# Patient Record
Sex: Female | Born: 1950 | Race: White | Hispanic: No | Marital: Married
Health system: Midwestern US, Academic
[De-identification: ages and names within clinical notes are randomized; demographics above are authoritative.]

## PROBLEM LIST (undated history)

## (undated) DIAGNOSIS — M5412 Radiculopathy, cervical region: ICD-10-CM

---

## 2014-10-03 IMAGING — CR CHEST
2 series · 2 of 2 positions shown · non-contrast
Comparison: November 12, 2013

CHEST 2 VIEWS
INDICATION: Pleural fibrosis
TECHNIQUE: PA and lateral projections.

[chest pa]
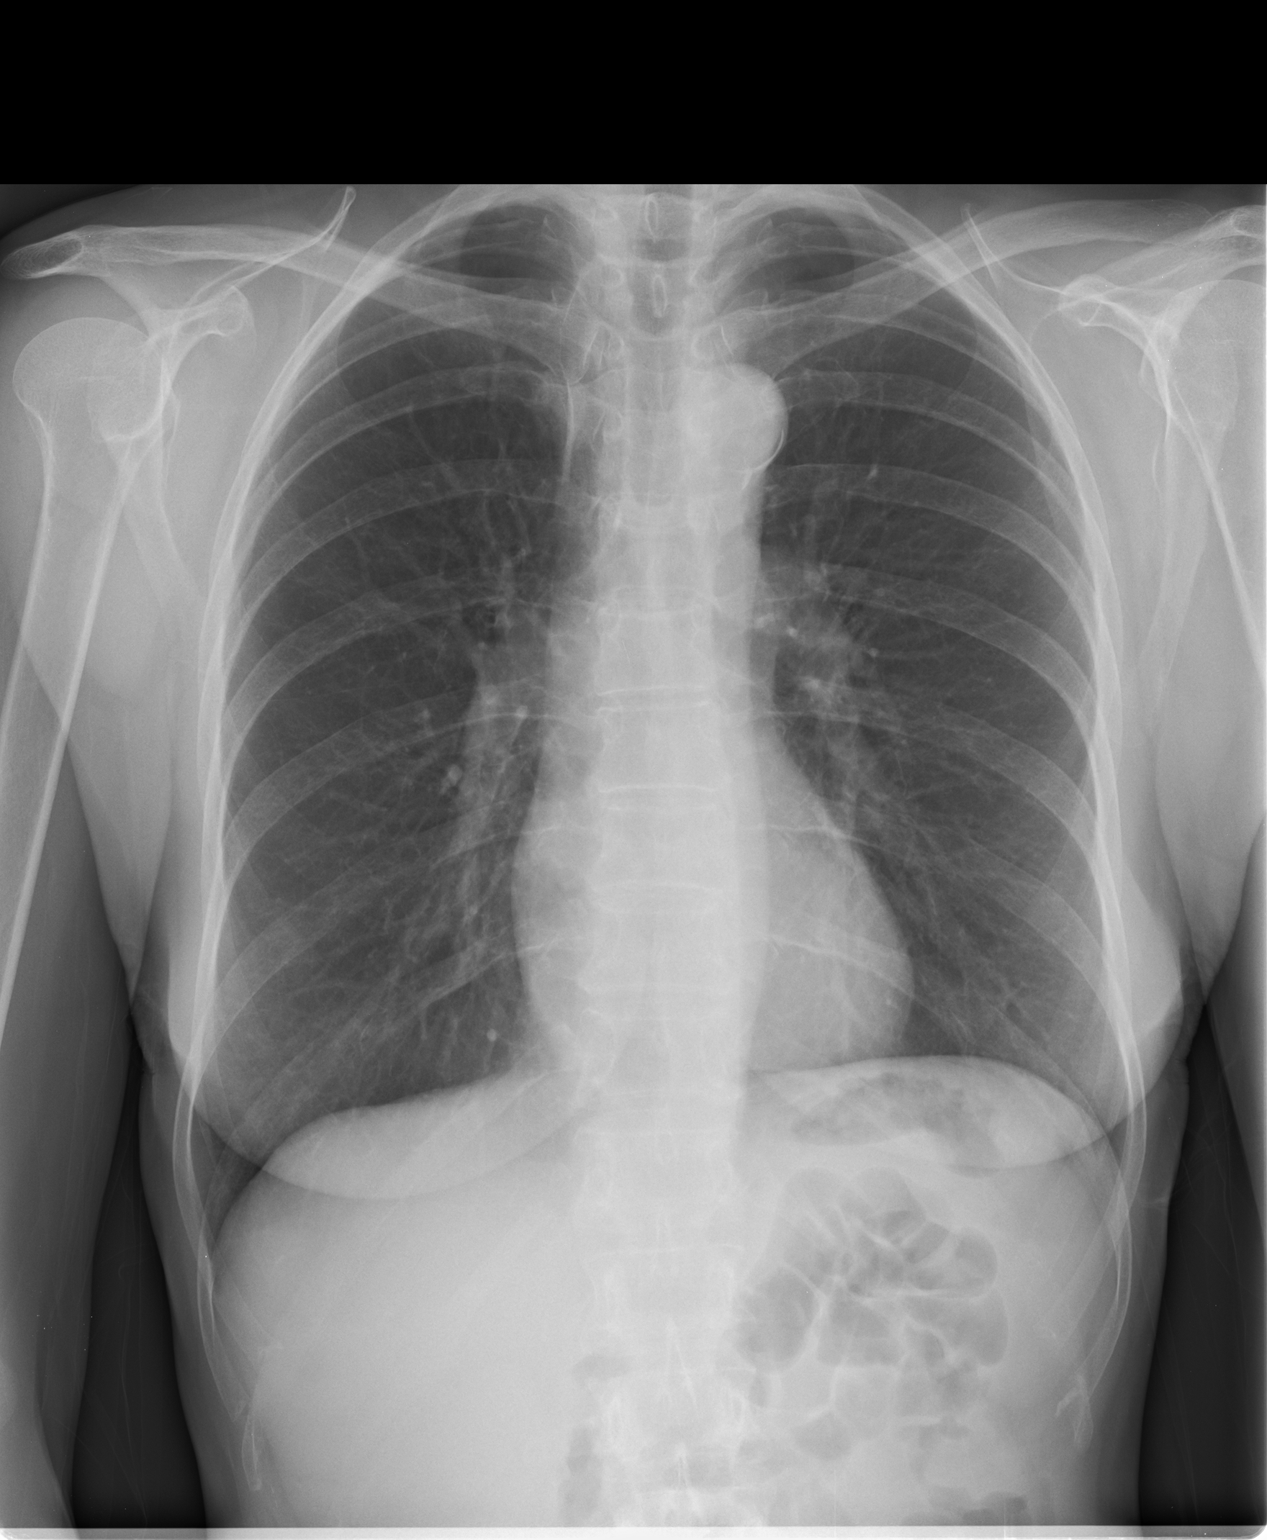

[chest lat]
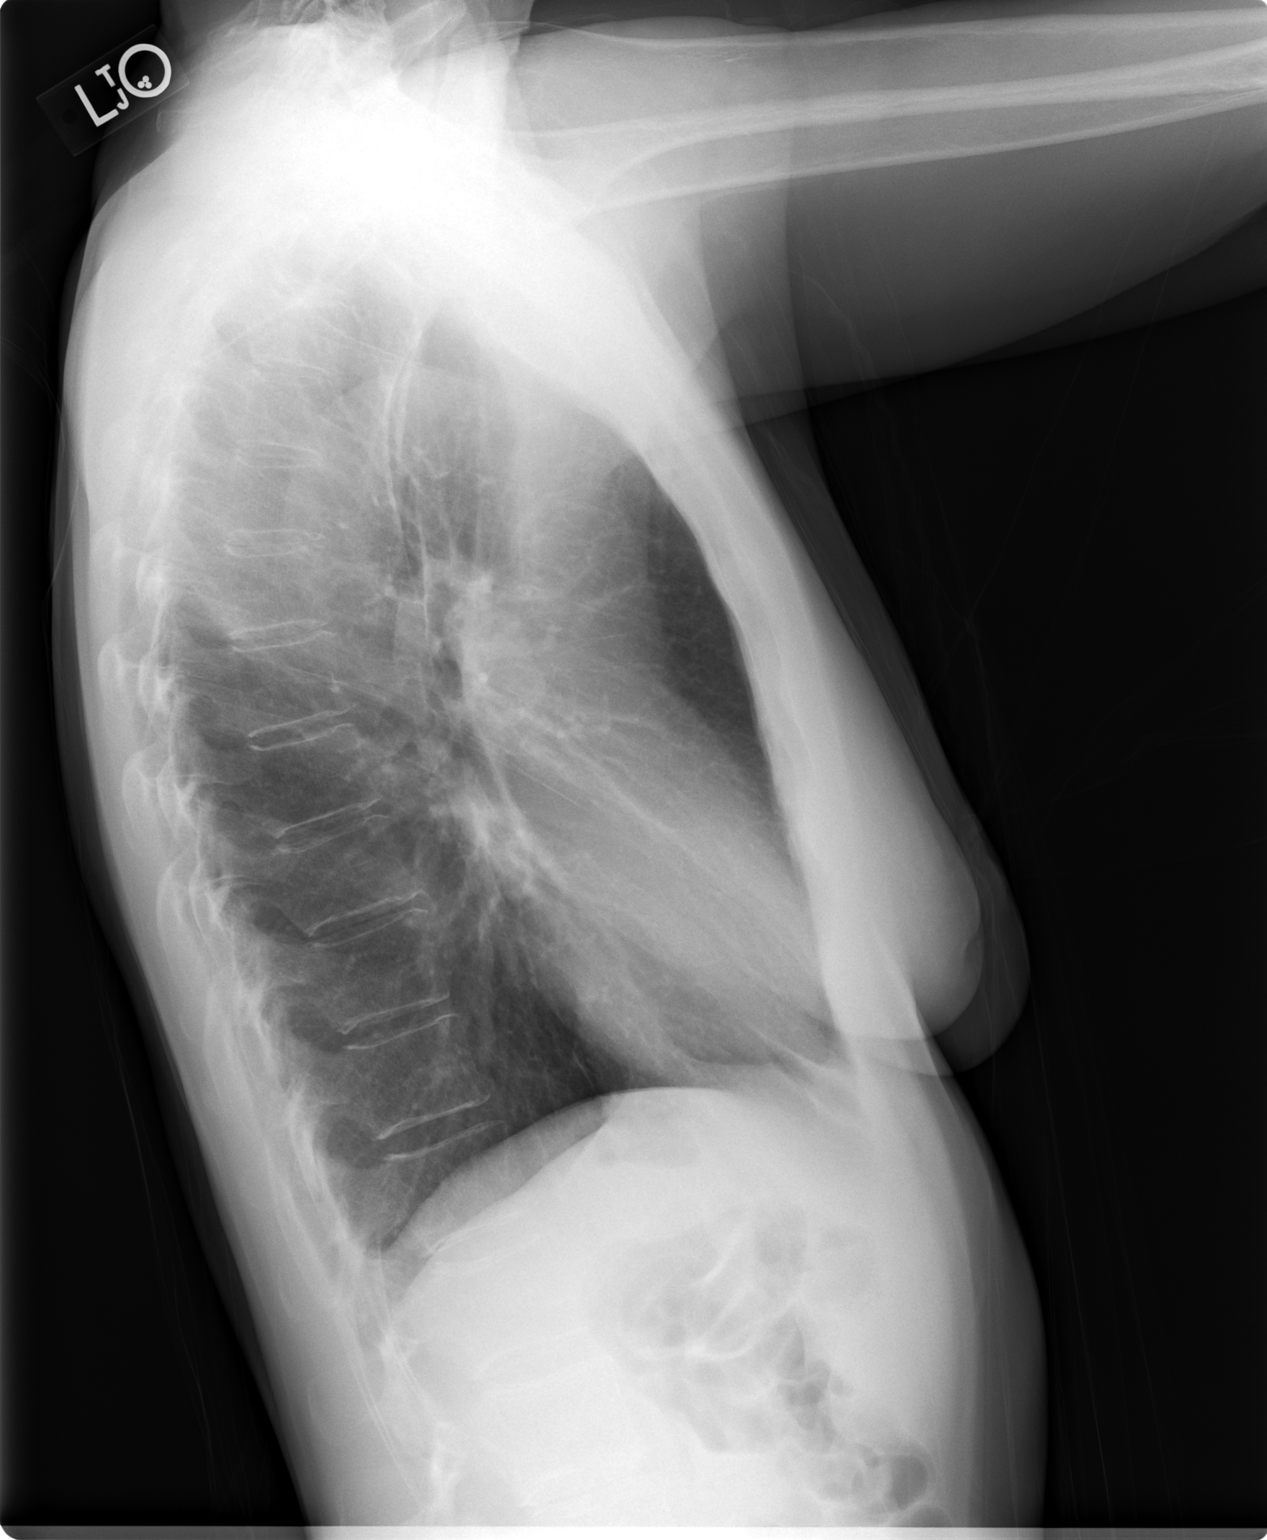

[2 of 2 positions shown; findings below may reference images not displayed]

IMPRESSION: No active process
FINDINGS: There is a normal inspiration.
There are a few scattered central calcified granulomas
The lung fields reveal no active process..
There is mild atheromatous disease without ectasia of the aortic knob
The heart and mediastinum are physiologic in size and contour

Tech Notes: PLEURAL FIBROSIS. TJ

## 2014-10-03 IMAGING — CT Abdomen^1_ABDOMEN_WITHOUT_WITH (Adult)
1 series · 15 of 32 positions shown, 19 images · non-contrast
Comparison: Chest two view
COMPARISON: Chest two view

------------- REPORT [id number] -------------
CT ABDOMEN WITHOUT CONTRAST
INDICATION: Pleural fibrosis
TECHNIQUE: Standard axial reference planes without contrast
Orthogonal reconstructions
Oral contrast administered

[Series 2: abd w/o 5.0 soft tissue · axial · non-contrast · 0.62mm/px · z∈[-357,-107]mm · 15 of 56 slices shown, 19 images]
[im 4/56  soft-tissue]
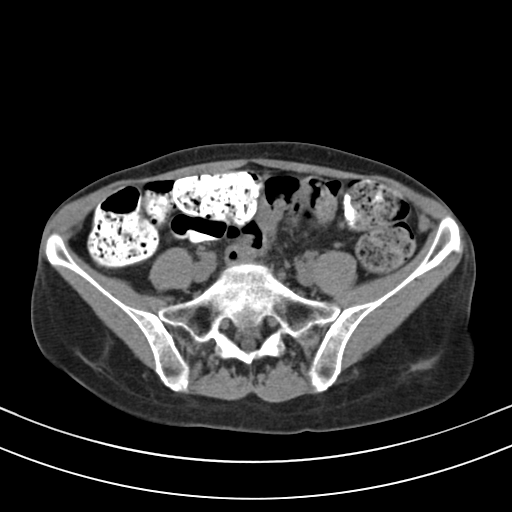
[im 4/56  bone]
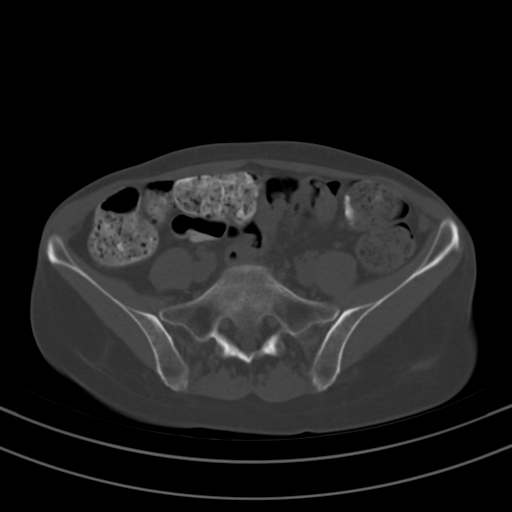
[im 8/56  soft-tissue]
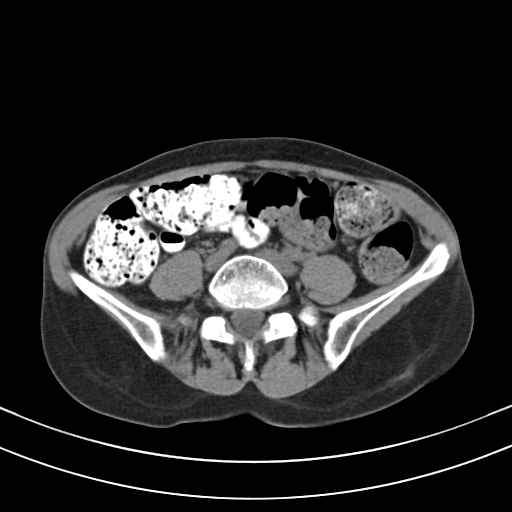
[im 11/56  soft-tissue]
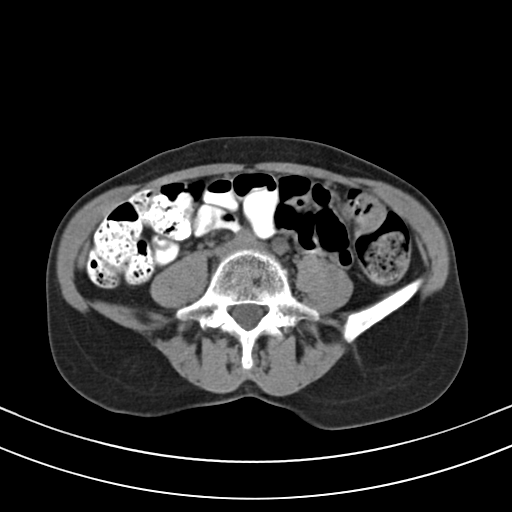
[im 16/56  soft-tissue]
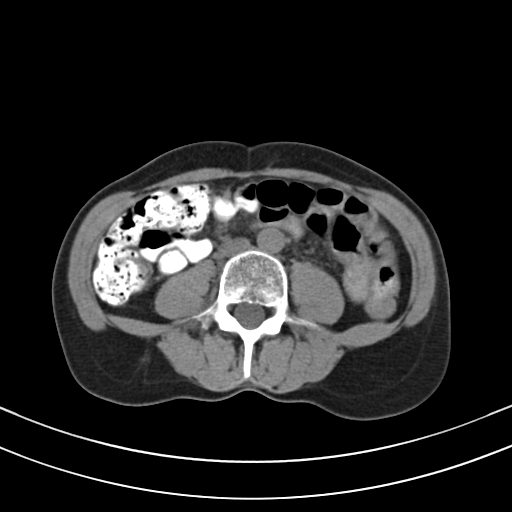
[im 20/56  soft-tissue]
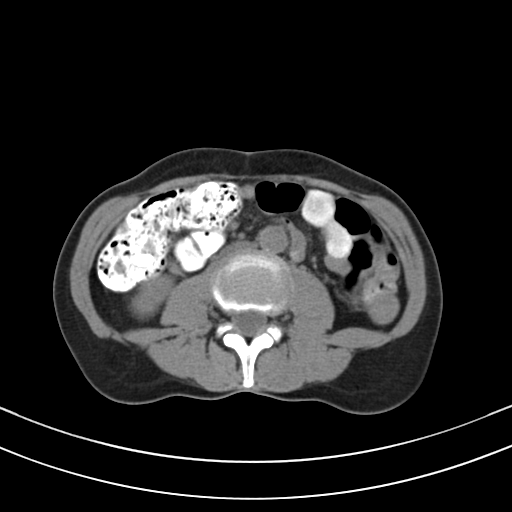
[im 24/56  soft-tissue]
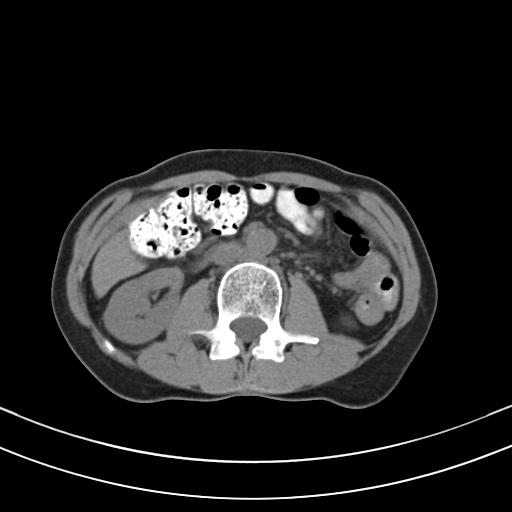
[im 29/56  soft-tissue]
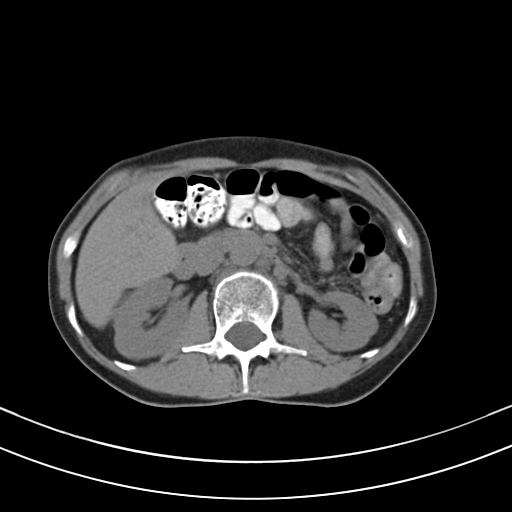
[im 32/56  soft-tissue]
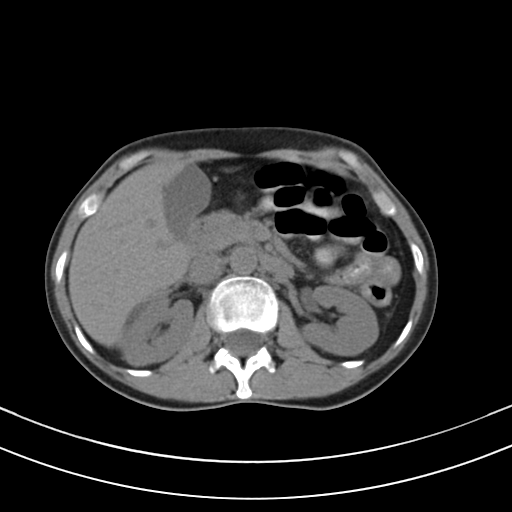
[im 36/56  soft-tissue]
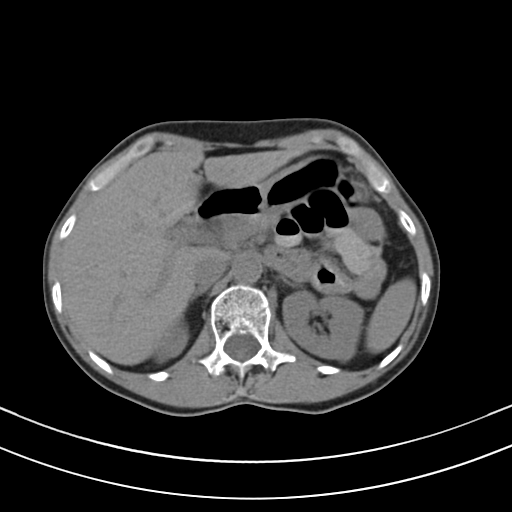
[im 36/56  bone]
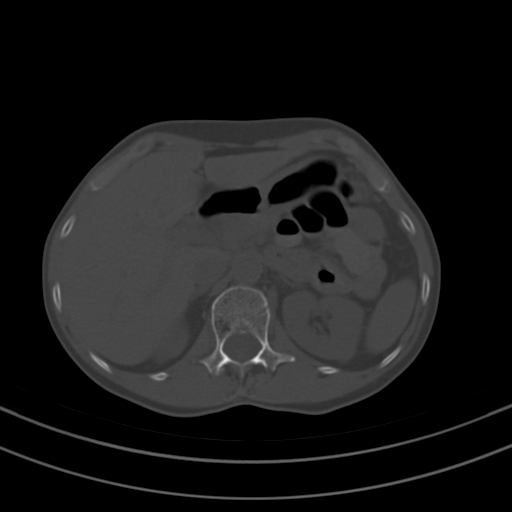
[im 40/56  soft-tissue]
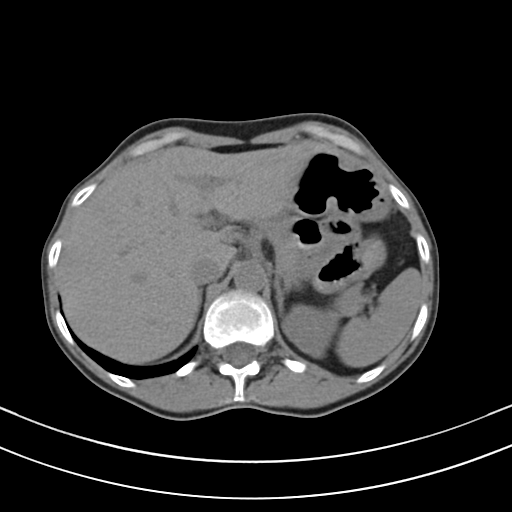
[im 45/56  soft-tissue]
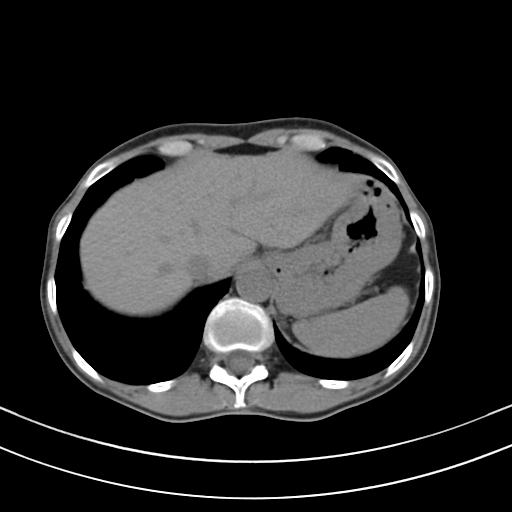
[im 48/56  soft-tissue]
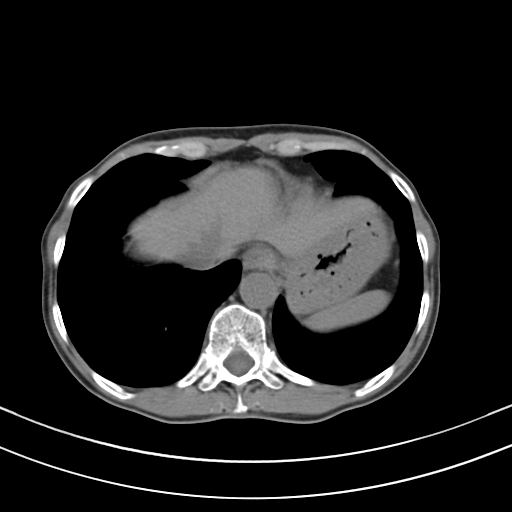
[im 48/56  lung]
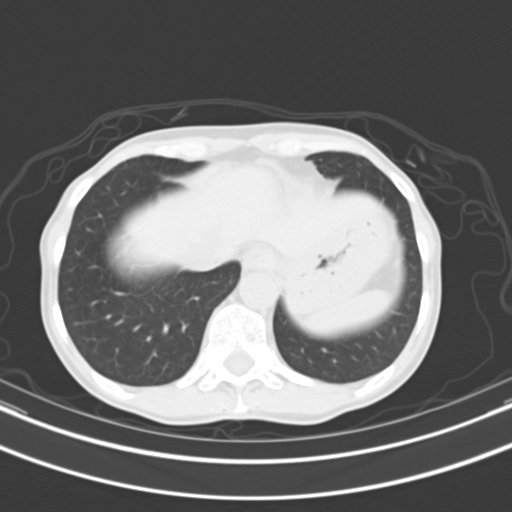
[im 50/56  lung]
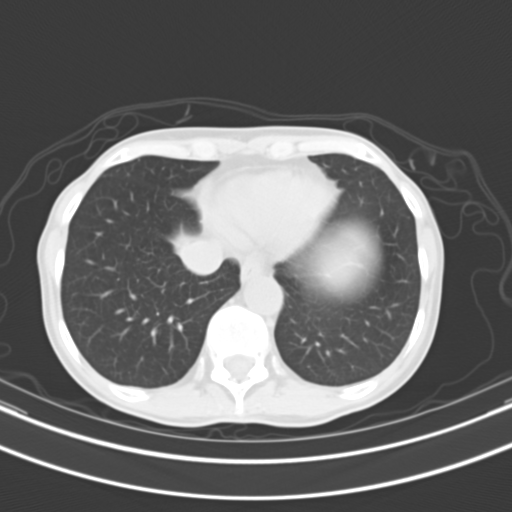
[im 52/56  soft-tissue]
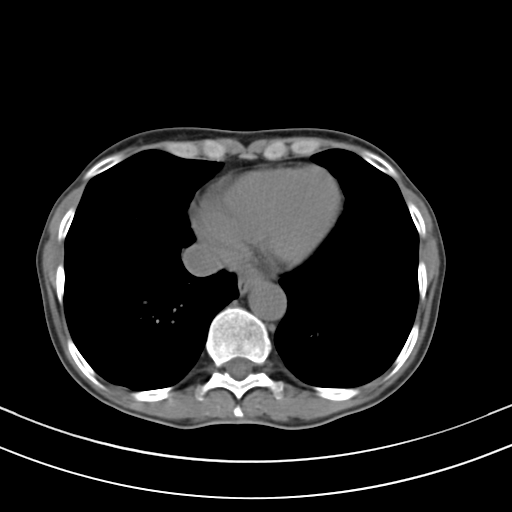
[im 52/56  lung]
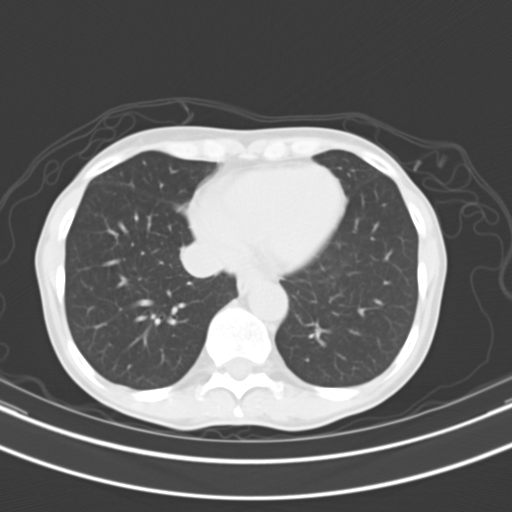
[im 54/56  lung]
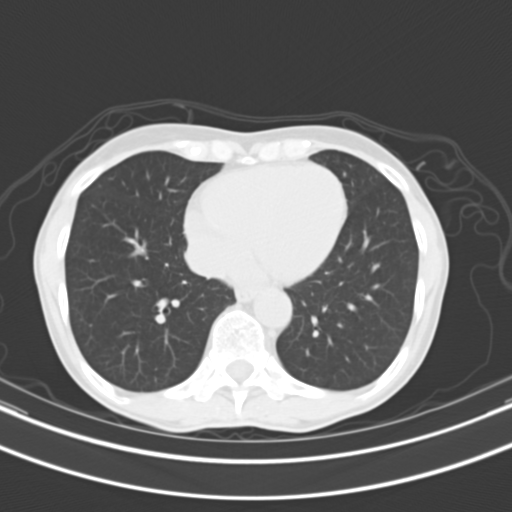

[15 of 32 positions shown; findings below may reference images not displayed]

IMPRESSION: Moderate fecal material in the visualized colon consistent with constipation.
No noncontrast solid organ abnormality.
No inflammatory changes of bowel
FINDINGS: The lung bases reveal no active process .
The gall bladder is unremarkable
The  upper abdominal solid organs reveal no non contrast abnormality.
The aorta is physiologic in size.
There is no suspicious adenopathy
There is no focal mesenteric edema
There is no  free air
There is no  free fluid
No dilated bowel or inflammatory changes of bowel are seen.
Oral contrast can be identified to the splenic flexure
There is moderate fecal material in the visualized colon

Tech Notes: PLEURAL FIBROSIS. TJ

------------- REPORT [id number] -------------
ADDENDUM

The images have been reviewed.
There is no evidence of fibrosis of the demonstrated lung fields or fibrosis of the demonstrated
retroperitoneum of the abdomen.

Job:  [id number]

Tech Notes: PLEURAL FIBROSIS. TJ

CT ABDOMEN WITHOUT CONTRAST
IMPRESSION: Moderate fecal material in the visualized colon consistent with constipation.
No noncontrast solid organ abnormality.
No inflammatory changes of bowel
FINDINGS: The lung bases reveal no active process .
The gall bladder is unremarkable
The  upper abdominal solid organs reveal no non contrast abnormality.
The aorta is physiologic in size.
There is no suspicious adenopathy
There is no focal mesenteric edema
There is no  free air
There is no  free fluid
No dilated bowel or inflammatory changes of bowel are seen.
Oral contrast can be identified to the splenic flexure
There is moderate fecal material in the visualized colon

Tech Notes: PLEURAL FIBROSIS. TJ

## 2015-04-03 IMAGING — CT Abdomen^1_CT_UROGRAM (Adult)
3 of 6 series · 12 of 39 positions shown, 18 images · IV contrast (APPLIED)
Comparison: none

[Series 2: abd/pelvis w/o 5.0 soft tissue · axial · non-contrast · 0.59mm/px · z∈[-429,-89]mm · 10 of 46 slices shown, 16 images]
[im 4/46  soft-tissue]
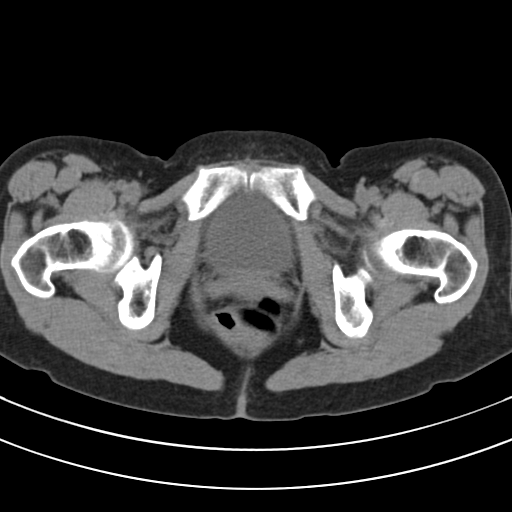
[im 4/46  bone]
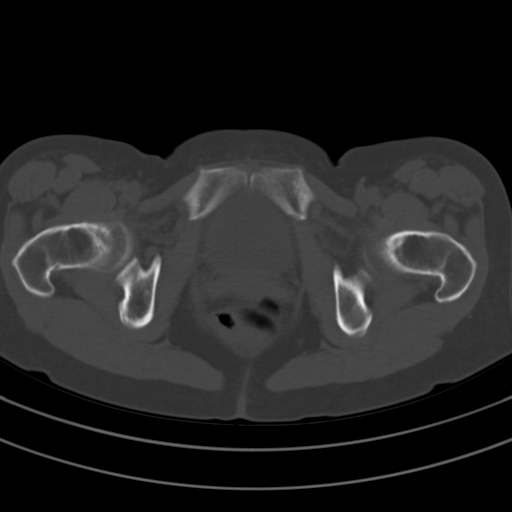
[im 8/46  soft-tissue]
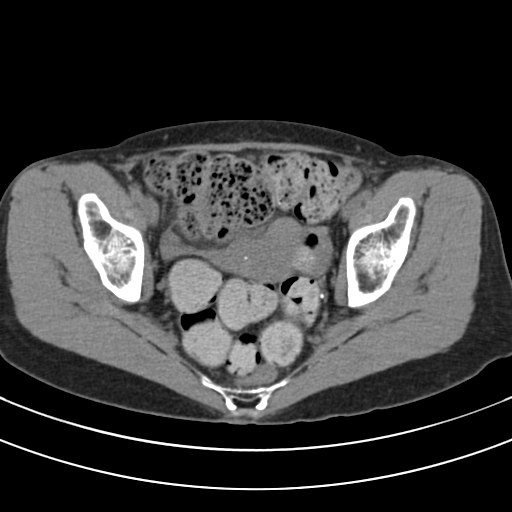
[im 12/46  soft-tissue]
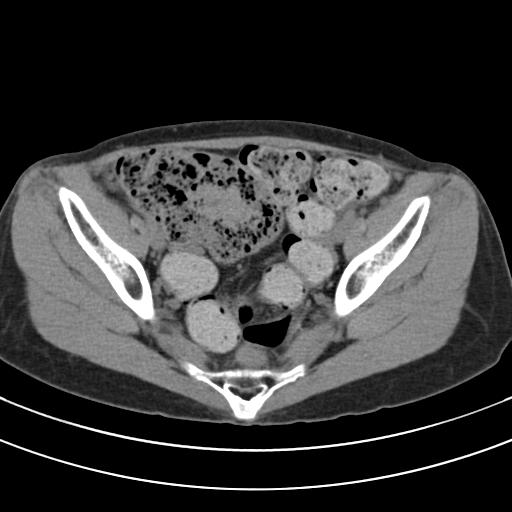
[im 16/46  soft-tissue]
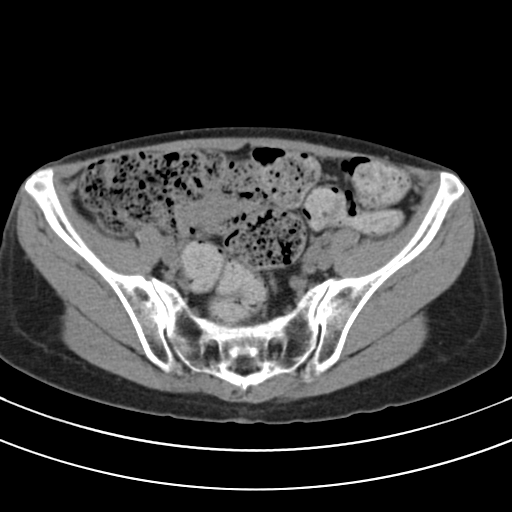
[im 19/46  soft-tissue]
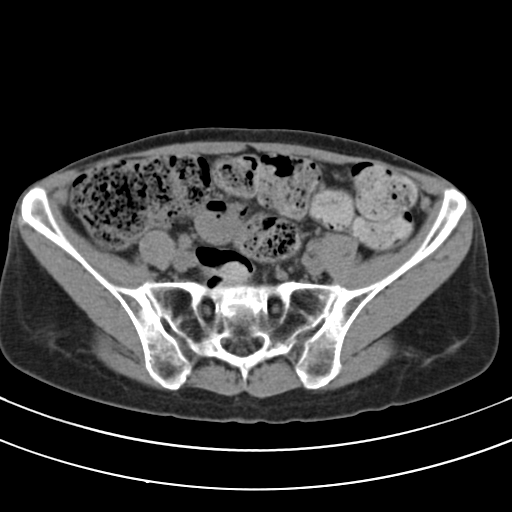
[im 27/46  soft-tissue]
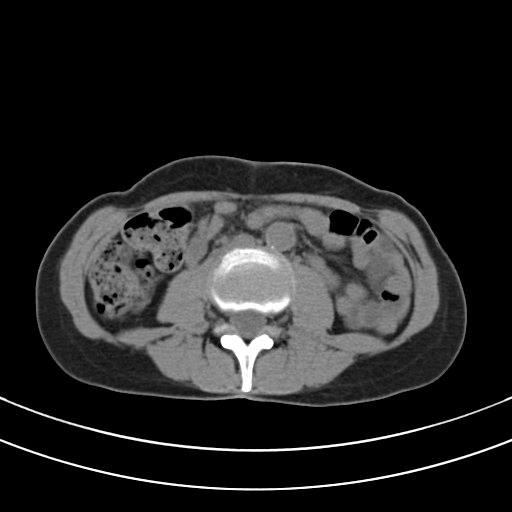
[im 31/46  soft-tissue]
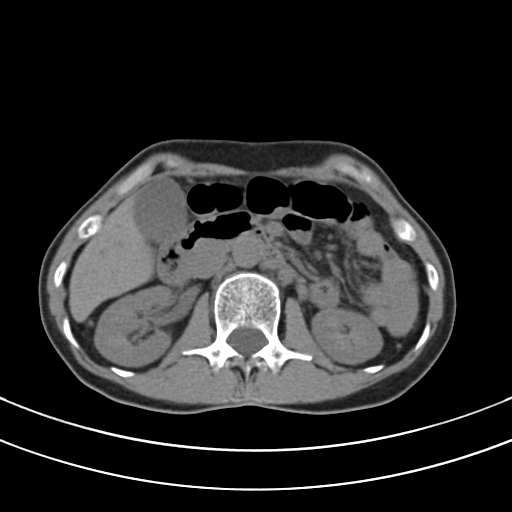
[im 31/46  lung]
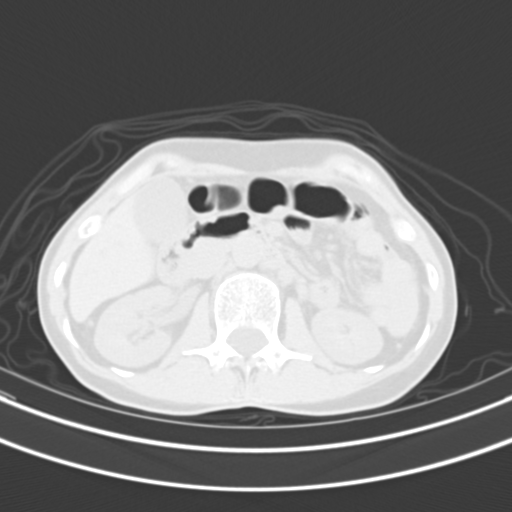
[im 34/46  soft-tissue]
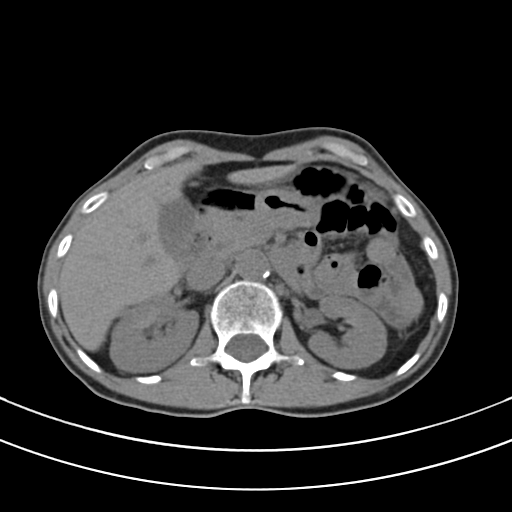
[im 34/46  lung]
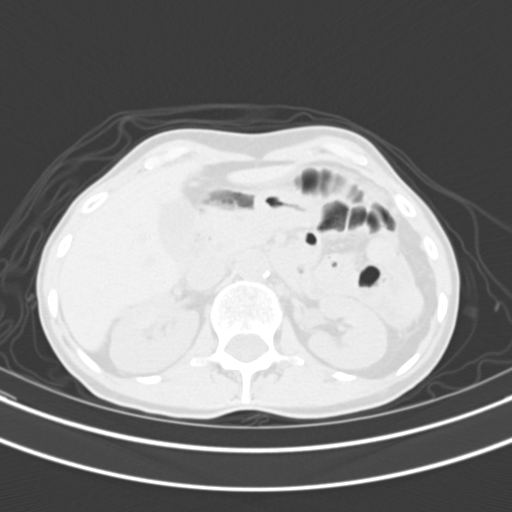
[im 38/46  soft-tissue]
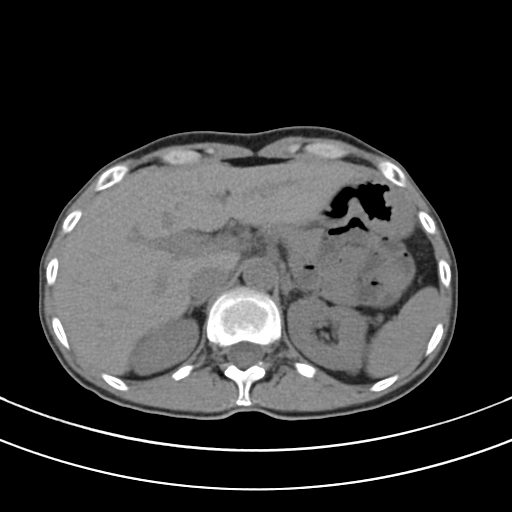
[im 38/46  lung]
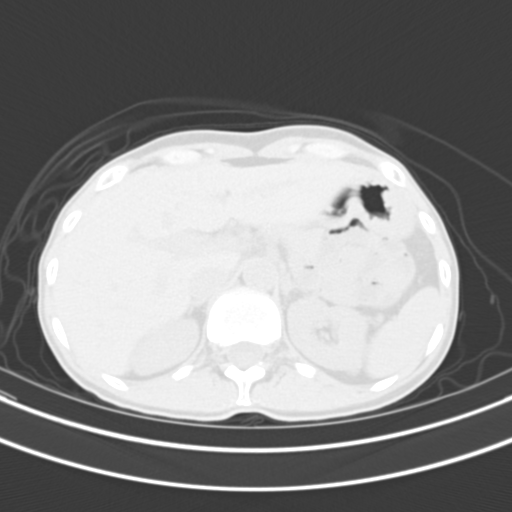
[im 38/46  bone]
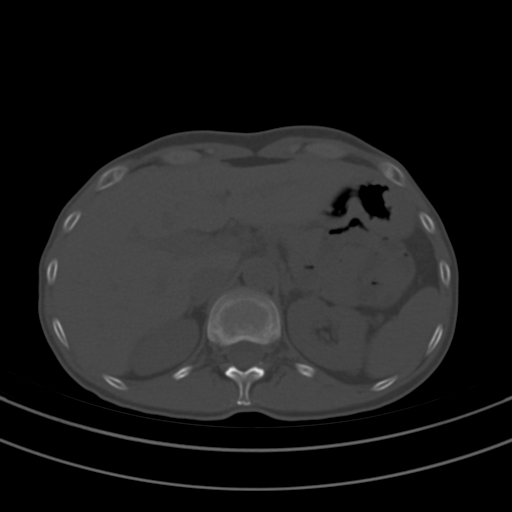
[im 42/46  soft-tissue]
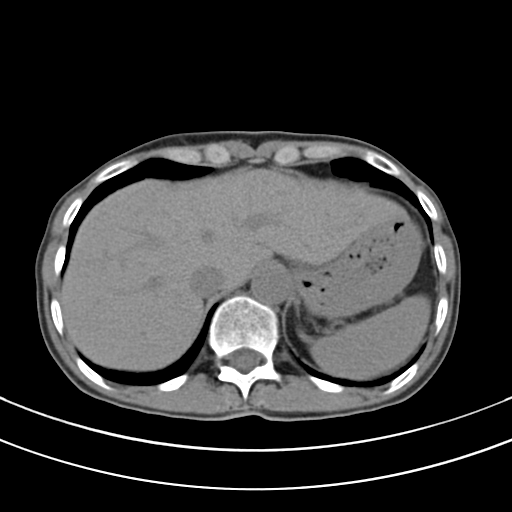
[im 42/46  lung]
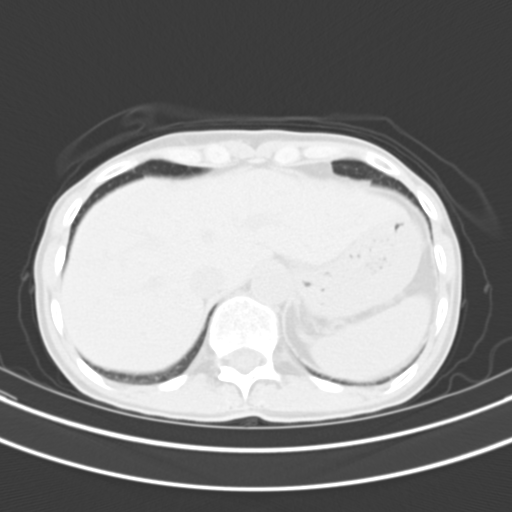

[Series 10: abd/pelvis with 5.0 sagittal · sagittal · 0.85mm/px · 1 of 22 slices shown]
[im 8/22  soft-tissue]
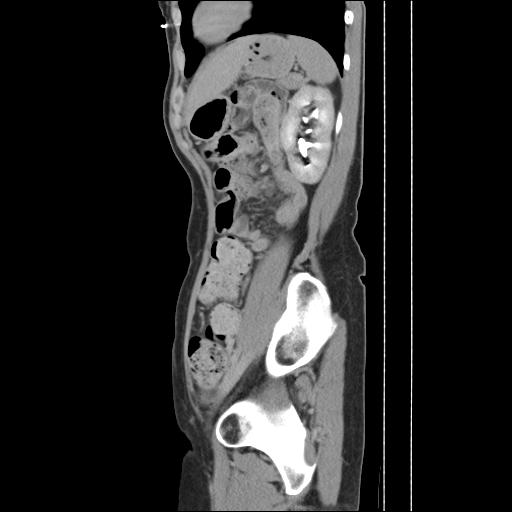

[Series 11: abd/pelvis with 5.0 coronal · coronal · 0.85mm/px · 1 of 3 slices shown]
[im 2/3  soft-tissue]
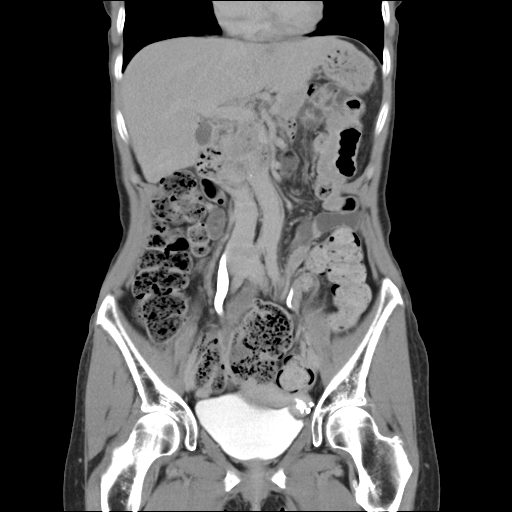

[12 of 39 positions shown; findings below may reference images not displayed]

DIAGNOSTIC STUDIES

EXAM
CT ABDOMEN AND PELVIS WITH and without CONTRAST

INDICATION
CT UROGRAM PER ORDER, R/O FIBROID VS MASS
PT C/O LOWER LEFT PELVIC PRESSURE. HX OF FIBROID. GFR 75. 900FO ZIOXAYY.
TJ/RG

TECHNIQUE
Contiguous transaxial imaging through the abdomen and pelvis were obtained before and during the
uneventful administration of 100 milliliter Optiray 350 low osmolar intravenous iodinated contrast
material. Coronal and sagittal reformations were obtained from the transaxial source data.

All CT scans at this facility use dose modulation, iterative reconstruction, and/or weight based
dosing when appropriate to reduce radiation dose to as low as reasonably achievable.

COMPARISONS
No comparison is available

FINDINGS
Lung bases are clear. The liver and gallbladder are normal. The spleen is unremarkable. The
adrenal glands are within normal limits. Kidneys are are essentially unremarkable. A few small
hypodensities can be identified which are too small to further characterize but likely represent
small cysts.
The pancreas is normal. No retroperitoneal adenopathy is seen. There is minimal calcification of
the aorta. Circum aortic left renal vein is evident.
Small large bowel are normal in caliber. No mesenteric adenopathy or ascites is seen. A moderate
to large amount of fecal material is noted throughout the colon. No signs symptoms of constipation
should be excluded clinically.
Densely calcified mass can be identified in the region of the left adnexa or uterus measuring
centimeters most likely indicating a pedunculated uterine fibroid. Second 2.3 centimeter fibroid
can be identified anteriorly image 69 series 5. No pelvic adenopathy is seen. Air is noted within
the urinary bladder presumably representing recent catheterization. Appropriate correlation is
recommended of. Osseous structures are within normal limits with no concerning osseous lesions
seen.

IMPRESSION
Densely calcified 2.8 centimeter lesion likely representing pedunculated fibroid adjacent to the
left uterus. Second 2.3 centimeter fibroid can be identified anteriorly.
2. Moderate to large amount of fecal material throughout the colon. Clinical evaluation to exclude
constipation is recommended.
3. Small amount of air within the urinary bladder. Clinical evaluation to confirm recent
catheterization is recommended. In the absence of catheterization, fistula or infection could be
considered and urinalysis may be beneficial.

## 2015-10-29 IMAGING — US RETLM
1 series · 14 of 16 positions shown · non-contrast
Comparison: none

[Series 1: us retroperitoneal limited · 14 of 46 slices shown]
[im 1/46]
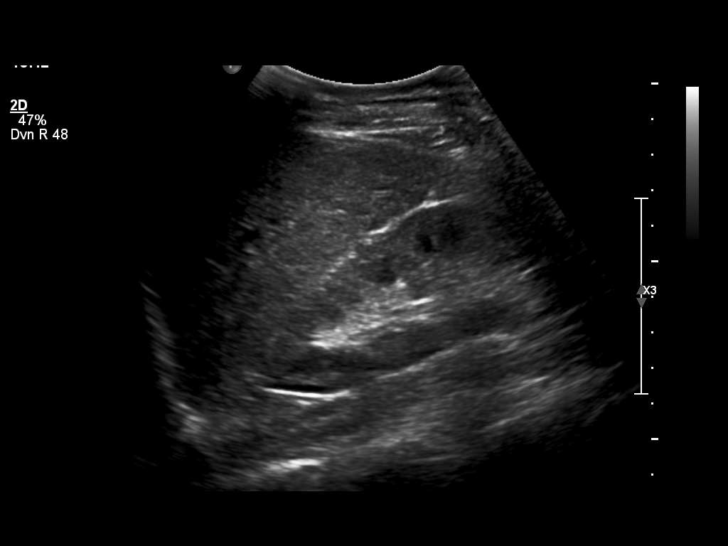
[im 4/46]
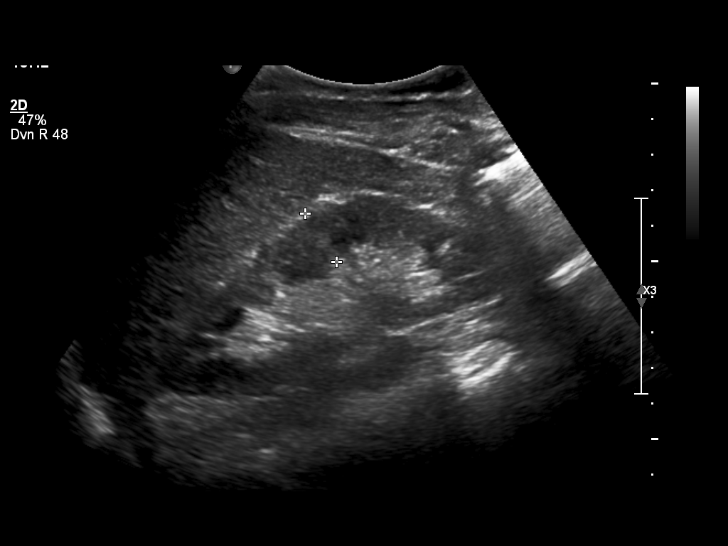
[im 7/46]
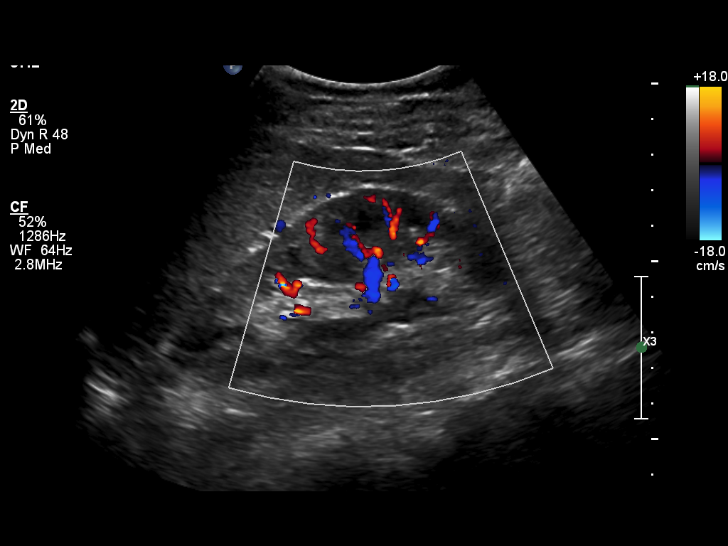
[im 13/46]
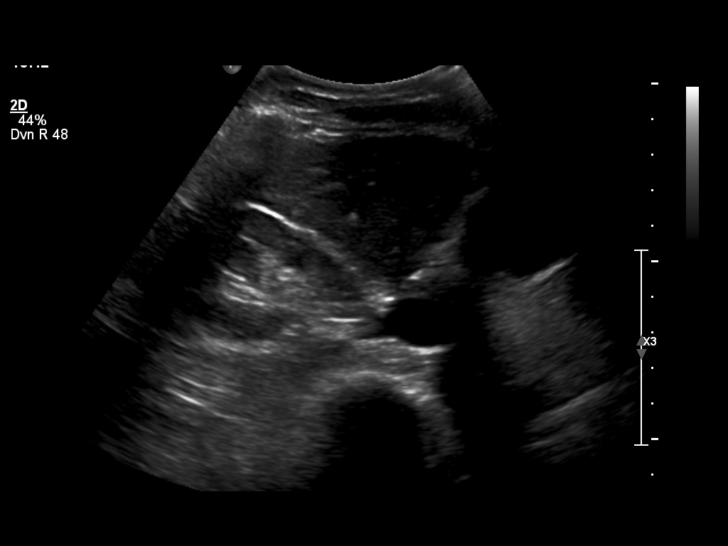
[im 16/46]
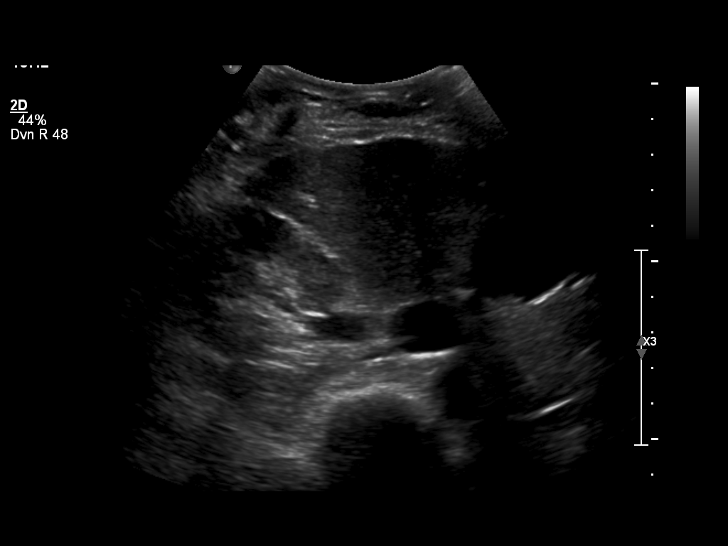
[im 19/46]
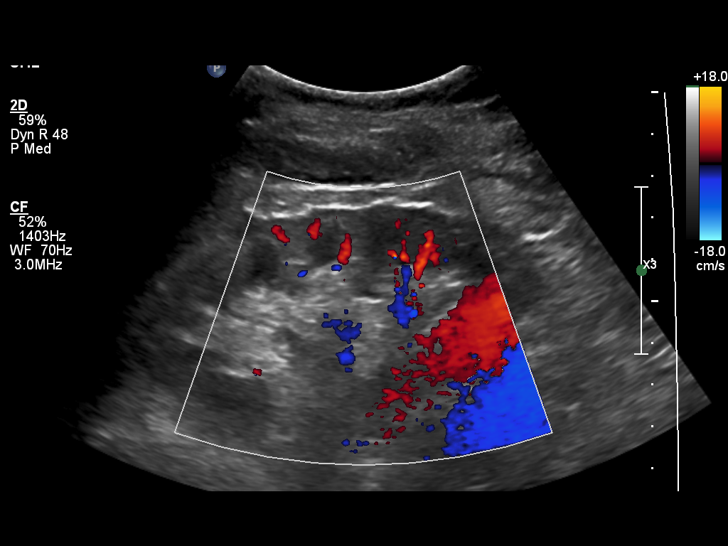
[im 22/46]
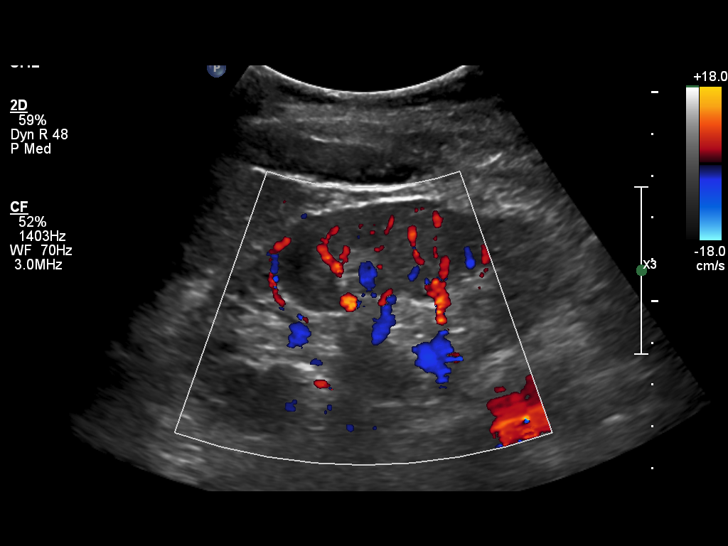
[im 25/46]
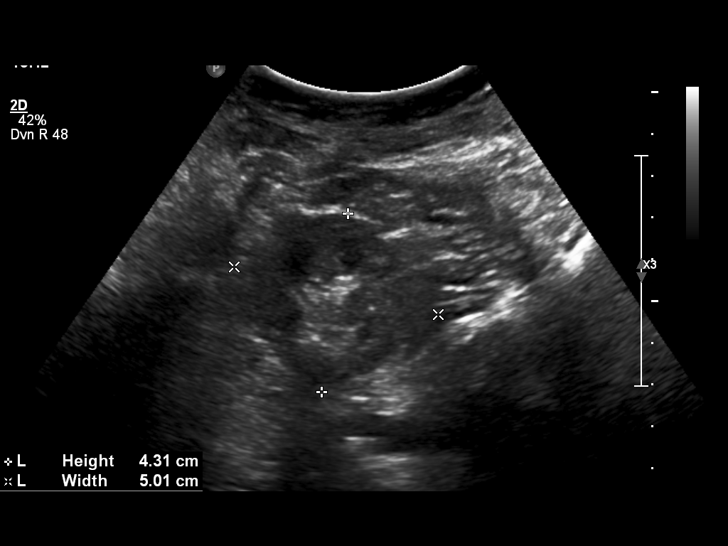
[im 28/46]
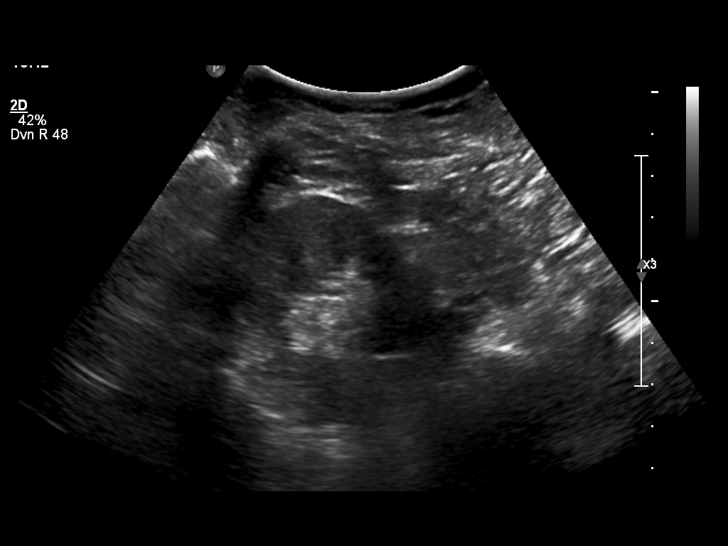
[im 31/46]
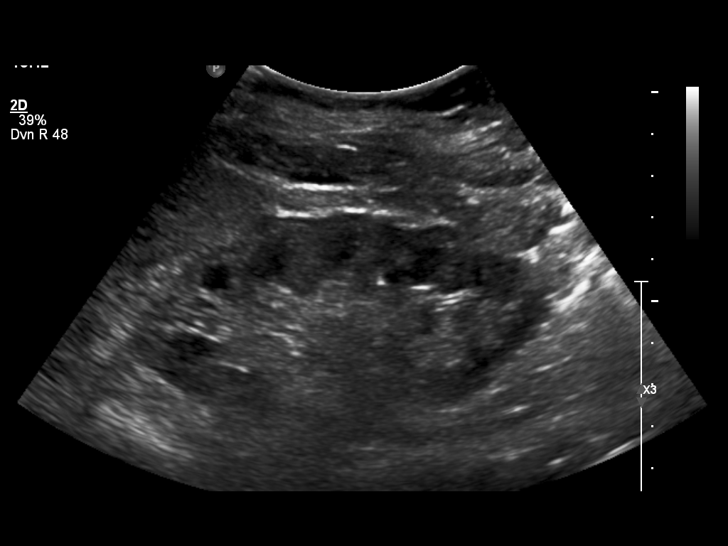
[im 37/46]
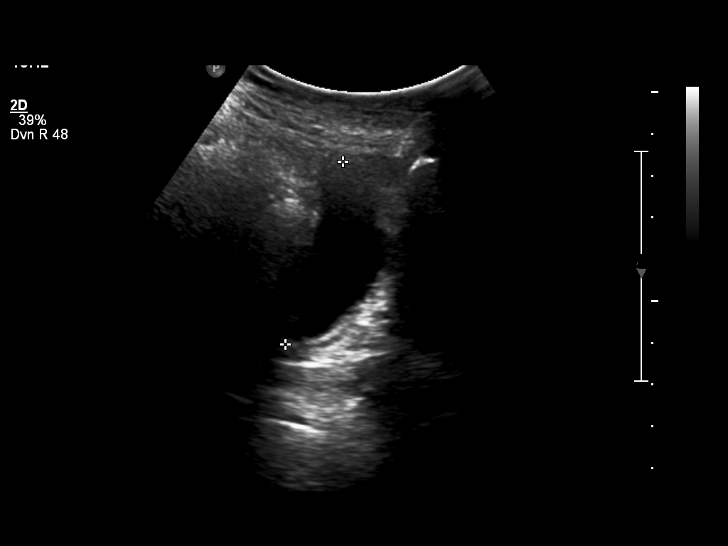
[im 40/46]
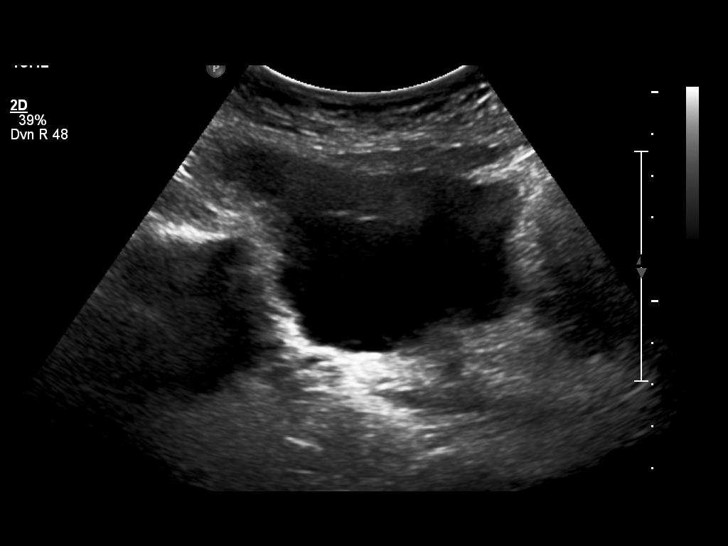
[im 43/46]
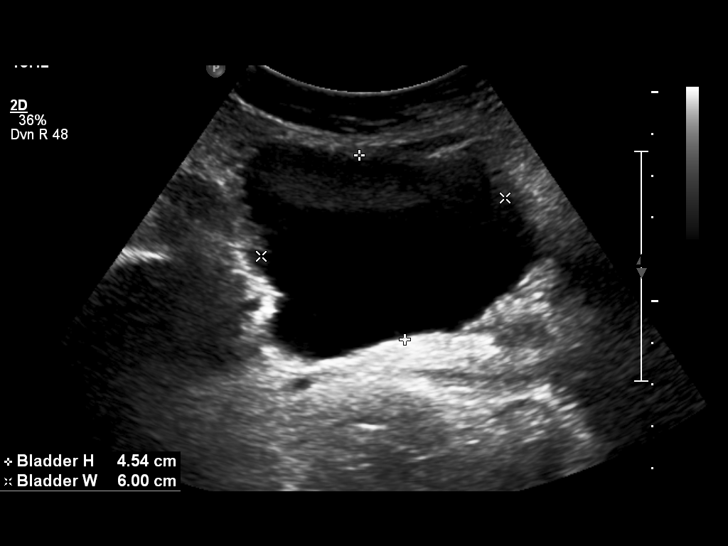
[im 46/46]
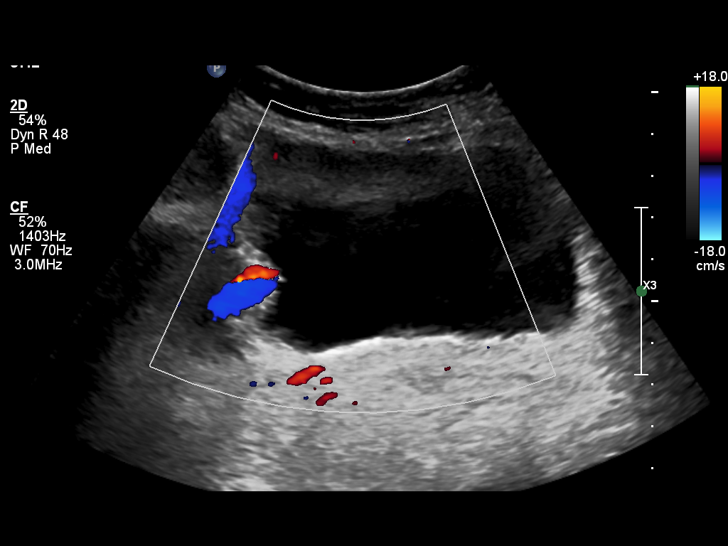

[14 of 16 positions shown; findings below may reference images not displayed]

ULTRASOUND REPORT

DIAGNOSTIC STUDIES

EXAM
Ultrasound kidneys and bladder

INDICATION
HEMATURIA, UTI
VILL

TECHNIQUE
Grayscale and color Doppler imaging of the kidneys and bladder was performed.

COMPARISONS
CT abdomen and pelvis with contrast performed April 03, 2015.

FINDINGS
Right kidney 10.7 x 4.7 x 4.2 centimeter and left kidney 11 x 4.3 x 5 centimeter. Normal thickness
renal cortices and cortical echogenicity is within normal limits. No hydronephrosis or shadowing
nephrolithiasis. Tiny cysts are seen in the bilateral kidneys in these are 1 centimeter or less and
simple.
Bladder volume 65 mL without mural thickening or nodularity. Left ureterovesicular jet is
demonstrated in the right was not.

IMPRESSION
1. No hydronephrosis or shadowing nephrolithiasis. Noncontrast CT is more sensitive for the
detection of urolithiasis.
2. Nonvisualization right ureterovesicular jet.
3. Tiny 1 centimeter or less bilateral simple renal cysts]

## 2016-07-26 ENCOUNTER — Encounter: Admit: 2016-07-26 | Discharge: 2016-07-26 | Payer: MEDICARE

## 2016-07-26 DIAGNOSIS — M791 Myalgia, unspecified site: ICD-10-CM

## 2016-07-26 DIAGNOSIS — M62838 Other muscle spasm: ICD-10-CM

## 2016-07-26 DIAGNOSIS — M5412 Radiculopathy, cervical region: Principal | ICD-10-CM

## 2016-07-26 NOTE — Telephone Encounter
Patient reports pain starting to return and would like to repeat TPI and CESI (last on 04/26/16) that helped her before. Coming from [place] so requests 2 procedures same day.

## 2016-08-30 ENCOUNTER — Encounter: Admit: 2016-08-30 | Discharge: 2016-08-30 | Payer: MEDICARE

## 2016-08-30 ENCOUNTER — Ambulatory Visit: Admit: 2016-08-30 | Discharge: 2016-08-31 | Payer: MEDICARE

## 2016-08-30 DIAGNOSIS — M542 Cervicalgia: ICD-10-CM

## 2016-08-30 DIAGNOSIS — R51 Headache: Principal | ICD-10-CM

## 2016-08-30 DIAGNOSIS — M791 Myalgia, unspecified site: ICD-10-CM

## 2016-08-30 MED ORDER — BUPIVACAINE (PF) 0.5 % (5 MG/ML) IJ SOLN
5 mL | Freq: Once | INTRAMUSCULAR | 0 refills | Status: CP
Start: 2016-08-30 — End: ?
  Administered 2016-08-30: 22:00:00 5 mL via INTRAMUSCULAR

## 2016-08-30 MED ORDER — LIDOCAINE (PF) 10 MG/ML (1 %) IJ SOLN
5 mL | Freq: Once | INTRAMUSCULAR | 0 refills | Status: CP | PRN
Start: 2016-08-30 — End: ?
  Administered 2016-08-30: 22:00:00 5 mL via INTRAMUSCULAR

## 2016-08-30 MED ORDER — TRIAMCINOLONE ACETONIDE 40 MG/ML IJ SUSP
80 mg | Freq: Once | EPIDURAL | 0 refills | Status: CP
Start: 2016-08-30 — End: ?
  Administered 2016-08-30: 22:00:00 80 mg via EPIDURAL

## 2016-08-30 MED ORDER — IOPAMIDOL 41 % IT SOLN
2.5 mL | Freq: Once | EPIDURAL | 0 refills | Status: CP
Start: 2016-08-30 — End: ?
  Administered 2016-08-30: 22:00:00 2.5 mL via EPIDURAL

## 2016-08-30 NOTE — Progress Notes
[facility]  INTERVENTIONAL PAIN PROCEDURE HISTORY AND PHYSICAL    Chief Complaint   Patient presents with   ??? Procedure     TPI       HISTORY OF PRESENT ILLNESS:  Neck pain and radicular pain. Also continues to have muscle tension/tightness.     Past Medical History:   Diagnosis Date   ??? Generalized headaches        Past Surgical History:   Procedure Laterality Date   ??? ANKLE SURGERY     ??? HEART CATHETERIZATION     ??? TUBAL LIGATION         family history includes Arthritis in her mother; Cancer in her father.    Social History     Social History   ??? Marital status: Married     Spouse name: N/A   ??? Number of children: N/A   ??? Years of education: N/A     Occupational History   ??? Not on file.     Social History Main Topics   ??? Smoking status: Never Smoker   ??? Smokeless tobacco: Never Used   ??? Alcohol use Not on file   ??? Drug use: Unknown   ??? Sexual activity: Not on file     Other Topics Concern   ??? Not on file     Social History Narrative   ??? No narrative on file       Allergies   Allergen Reactions   ??? Sulfa (Sulfonamide Antibiotics) UNKNOWN       Vitals:    08/30/16 1509   BP: 115/69   Pulse: 69   Resp: 16   Temp: 36.6 ???C (97.8 ???F)   TempSrc: Oral   SpO2: 99%   Weight: 49.4 kg (109 lb)   Height: 162.6 cm (64)       REVIEW OF SYSTEMS: 10 point ROS obtained and negative      PHYSICAL EXAM:    General: The patient is a well-developed, well nourished 66 y.o. female in no acute distress.   HEENT: Head is normocephalic and atraumatic. Pupils are equal and reactive to light bilaterally.   Cardiac: Based on palpation, pulse appears to be regular rate and rhythm.   Pulmonary: The patient has unlabored respirations and bilateral symmetric chest excursion.   Abdomen: Soft, nontender, and nondistended. There is no rebound or guarding.   Extremities: No clubbing, cyanosis, or edema.     Neurologic:   The patient is alert and oriented times 3.     Musculoskeletal:     C-Spine There is mod paraspinal tenderness. Paraspinal muscle tone is increased.  ROM with flexion, extension, rotation, and lateral bending is intact but stiff. Radicular symptoms with ROM.  Strength is equal and adequate bilaterally in the flexors and extensors of the bilateral upper extremities.   Hoffman's is negative bilaterally.        IMPRESSION:    1. Cervical radiculopathy    2. Myalgia    3. Spasm of muscle         PLAN: Cervical Epidural Steroid Injection and TPI

## 2016-08-30 NOTE — Procedures
Attending Surgeon: Evelina Bucy, MD    Anesthesia: Local    Pre-Procedure Diagnosis:   1. Cervical radiculopathy    2. Myalgia    3. Spasm of muscle        Post-Procedure Diagnosis:   1. Cervical radiculopathy    2. Myalgia    3. Spasm of muscle        [facility]LAM CRV/THRC  Procedure: epidural - interlaminar    Laterality: n/a  Location: cervical ESI with imaging - C7-T1      Consent:   Consent obtained: written  Consent given by: patient  Risks discussed: allergic reaction, bleeding, bruising, infection, never damage, no change or worsening in pain, pneumo thorax, reaction to medication, seizure, swelling and weakness  Alternatives discussed: alternative treatment, delayed treatment and no treatment  Discussed with patient the purpose of the treatment/procedure, other ways of treating my condition, including no treatment/ procedure and the risks and benefits of the alternatives. Patient has decided to proceed with treatment/procedure.        Universal Protocol:  Relevant documents: relevant documents present and verified  Site marked: the operative site was marked  Patient identity confirmed: Patient identify confirmed verbally with patient.      Time out: Immediately prior to procedure a time out was called to verify the correct patient, procedure, equipment, support staff and site/side marked as required      Procedures Details:   Indications: pain   Prep: chlorhexidine  Patient position: prone  Estimated Blood Loss: minimal  Specimens: none  Amount Injected:   C7-T1: 4mL    Number of Levels: 1  Approach: midline  Guidance: fluoroscopy  Contrast: Procedure confirmed with contrast under live fluoroscopy.  Needle and Epidural Catheter: tuohy  Needle size: 18 G  Injection procedure: Incremental injection and Negative aspiration for blood  Patient tolerance: Patient tolerated the procedure well with no immediate complications. Pressure was applied, and hemostasis was accomplished. Outcome: Pain unchanged  Comments: CERVICAL INTERLAMINAR EPIDURAL STEROID INJECTION    PROCEDURE:  1) C7-T1 interlaminar epidural steroid injection  2) Fluoroscopic needle guidance    REASON FOR PROCEDURE: Cervical radiculopathy, DDD (cervical)    ATTENDING PHYSICIAN: Evelina Bucy, MD    MEDICATIONS INJECTED: 2 mL of triamciniolone (80 mg) and 2 mL of sterile, preservative-free normal saline    LOCAL ANESTHETIC INJECTED: 1 mL of 1% lidocaine    SEDATION MEDICATIONS: None    ESTIMATED BLOOD LOSS: None    SPECIMENS REMOVED: None    COMPLICATIONS: None    TECHNIQUE: Time-out was taken to identify the correct patient, procedure and side prior to starting the procedure. With the patient lying in a prone position with the neck in a slightly  flexed position, the area was prepped and draped in sterile fashion using DuraPrep and a fenestrated drape. The area was determined under fluoroscopic guidance. A 27-gauge, 1.25-inch  needle was used to anesthetize the needle entry site and subcutaneous tissues.     The 18-gauge, 3.5-inch Tuohy needle was advanced through the ligamentum flavum using loss of resistance technique. Once the tip of the needle was thought to be in the desired position, contrast was injected to confirm only epidural spread and no vascular runoff via A-P and lateral views. The injectate was then injected slowly with intermittent negative aspiration.    The procedure was completed without complications and was tolerated well. The patient was monitored after the procedure. The patient (or responsible party) was given post-procedure and  discharge  instructions to follow at home. The patient was discharged in stable condition.    Evelina Bucy, MD, MBA      [facility] TRIG[facility] INJECTION  Locations: R upper trapezius, R cervical, R levator scapulae and R thoracic  Consent:   Consent obtained: written  Consent given by: patient  Alternatives discussed: alternative treatment, delayed treatment and no treatment Discussed with patient the purpose of the treatment/procedure, other ways of treating my condition, including no treatment/ procedure and the risks and benefits of the alternatives. Patient has decided to proceed with treatment/procedure.        Universal Protocol:  Relevant documents: relevant documents present and verified  Site marked: the operative site was marked  Patient identity confirmed: Patient identify confirmed verbally with patient.      Time out: Immediately prior to procedure a time out was called to verify the correct patient, procedure, equipment, support staff and site/side marked as required      Procedures Details:   Indications: muscle spasm, myalgia and painPrep: alcohol  Needle size: 27 G  Number of muscles: 3 or more  Approach: posterior  Medications administered: 5 mL lidocaine PF 1% (10 mg/mL)  Patient tolerance: Patient tolerated the procedure well with no immediate complications. Pressure was applied, and hemostasis was accomplished.  Comments: PROCEDURE: Trigger Point Injections    Pre-Procedure Diagnoses:  1. Myalgia  2. Myofascial pain  3. Muscle spasm    Post-Procedure Diagnoses:  Same    Physician: Evelina Bucy, MD    ANESTHESIA: 50:50 solution of Bupivacaine 0.25% and lidocaine 2%    COMPLICATIONS: None.    Location(s) of pain: As noted above  Presence of point tenderness in a tight band of muscle in above area: Yes  Restricted range of motion: Yes  Conservative therapy attempted for at least 6 weeks: active exercises, heat, activity modification, pharmacotherapy  Other components of comprehensive management: Active exercises, pharmacotherapy, activity modification    DESCRIPTION OF PROCEDURE: The procedure risks, hazards and alternatives were discussed with the patient and a proper consent was obtained. The area over each trigger point was prepped with alcohol utilizing sterile technique. After isolating it between two palpating fingertips a 27-gauge 1.5 needle was placed in the center of the myofascial spasms and a negative aspiration was performed. Then 0.5cc of the solution above was injected into each trigger point.     Trigger points in each of the muscle groups noted above were injected. A total of 4 ml of the local anesthetic solution was utilized.    The patient tolerated the procedure well without any apparent difficulties or complications.        Estimated blood loss: none or minimal  Specimens: none  Patient tolerated the procedure well with no immediate complications. Pressure was applied, and hemostasis was accomplished.

## 2016-08-31 DIAGNOSIS — M791 Myalgia: ICD-10-CM

## 2016-08-31 DIAGNOSIS — M5412 Radiculopathy, cervical region: Principal | ICD-10-CM

## 2016-10-28 IMAGING — CT Abdomen^1_ABDOMEN_PELVIS_WITHOUT (Adult)
2 series · 15 of 36 positions shown, 19 images · non-contrast
Comparison: none

[Series 2: abd/pelvis w/o 5.0 soft tissue · axial · non-contrast · 0.88mm/px · z∈[-459,-79]mm · 14 of 85 slices shown, 17 images]
[im 6/85  soft-tissue]
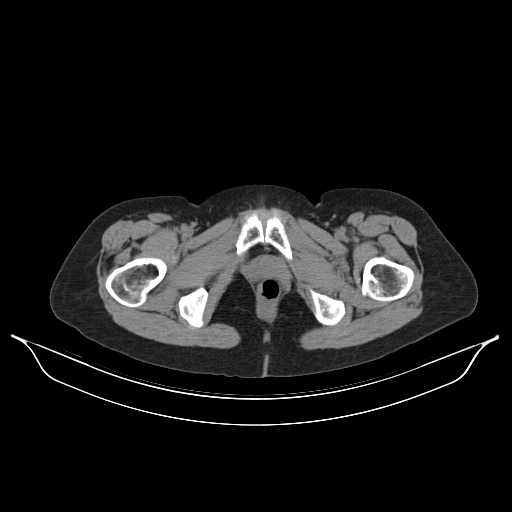
[im 6/85  bone]
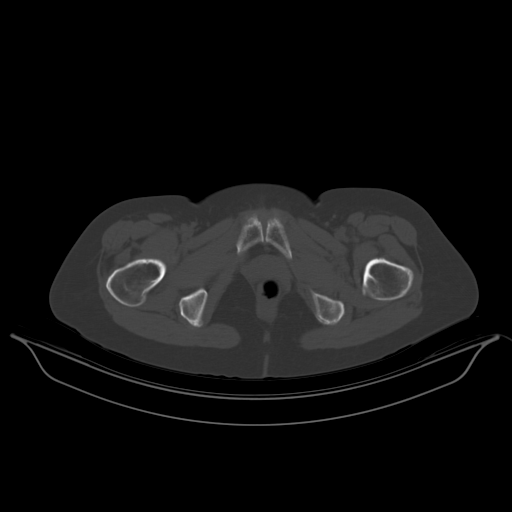
[im 14/85  soft-tissue]
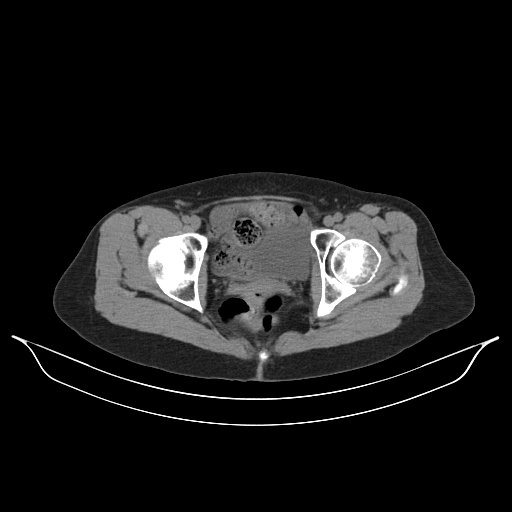
[im 19/85  soft-tissue]
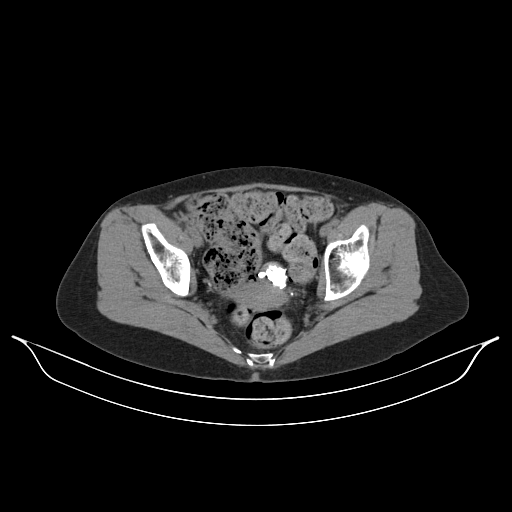
[im 28/85  soft-tissue]
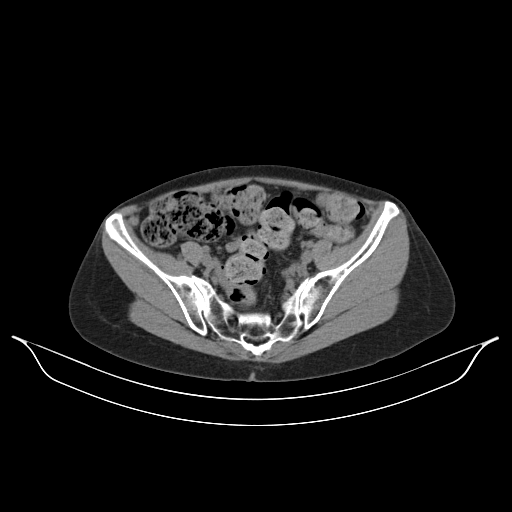
[im 36/85  soft-tissue]
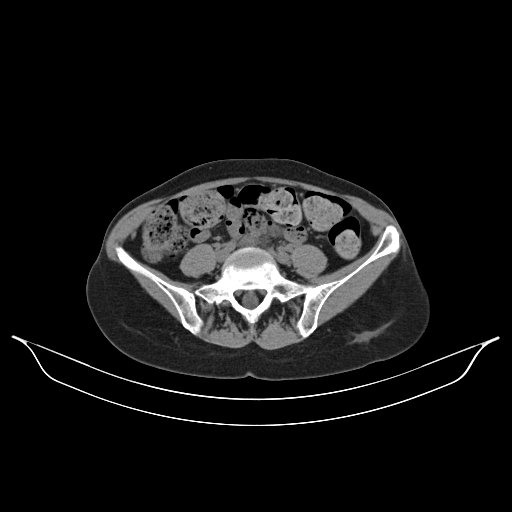
[im 44/85  soft-tissue]
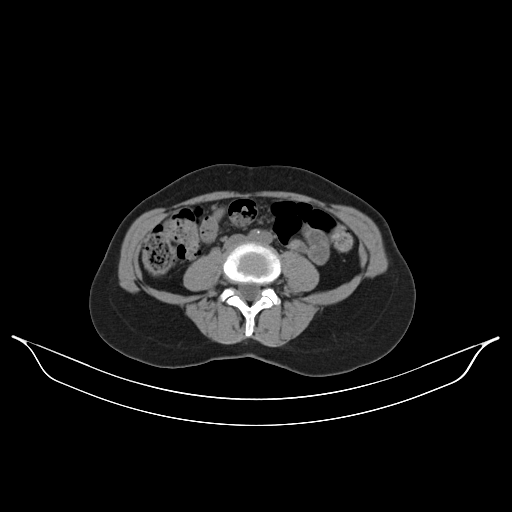
[im 49/85  soft-tissue]
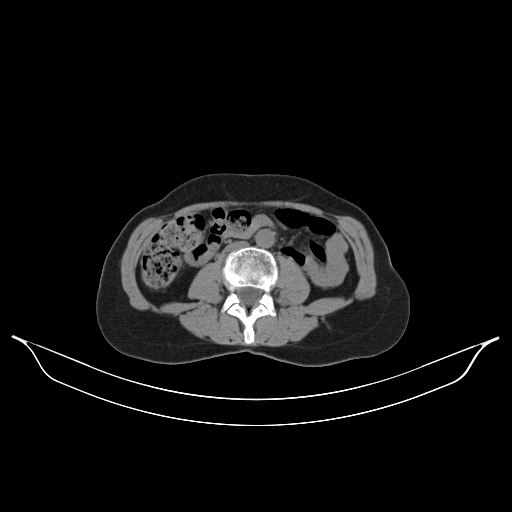
[im 57/85  soft-tissue]
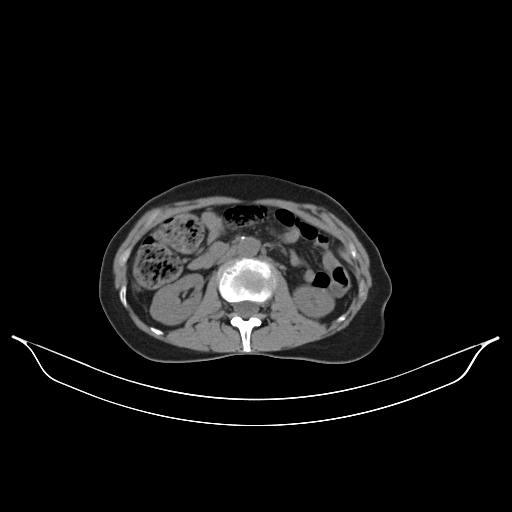
[im 66/85  soft-tissue]
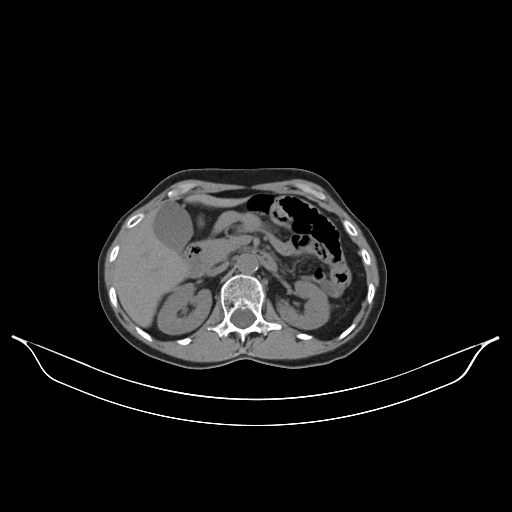
[im 66/85  bone]
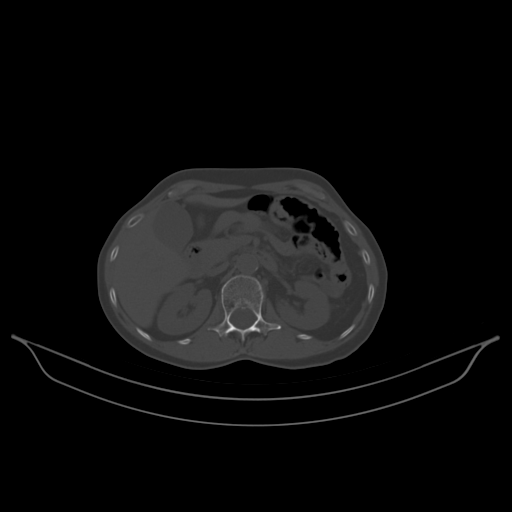
[im 71/85  soft-tissue]
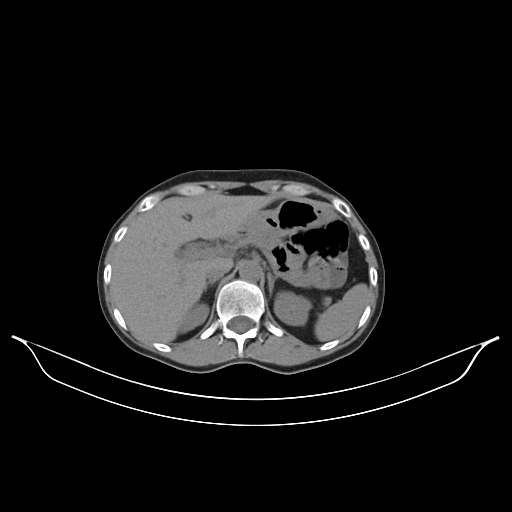
[im 74/85  lung]
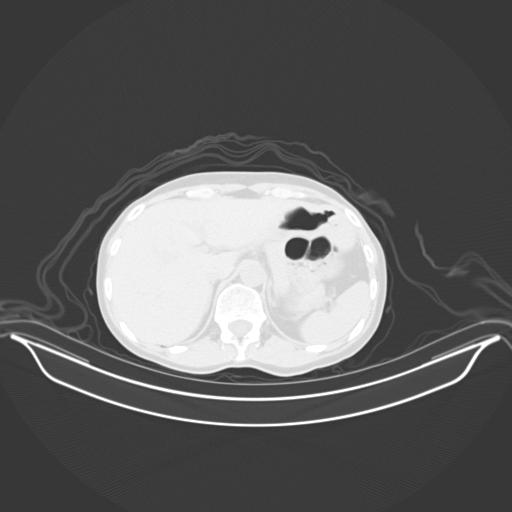
[im 76/85  lung]
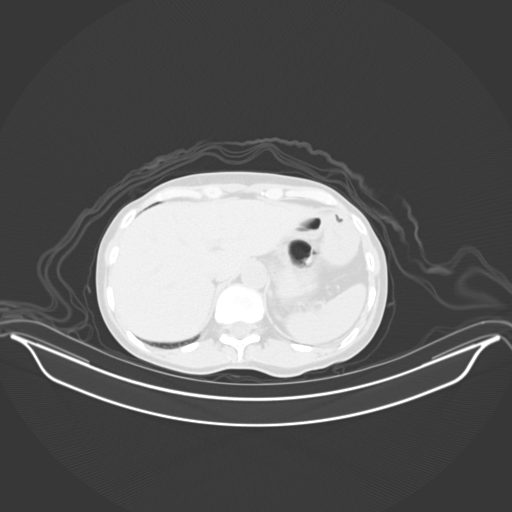
[im 79/85  soft-tissue]
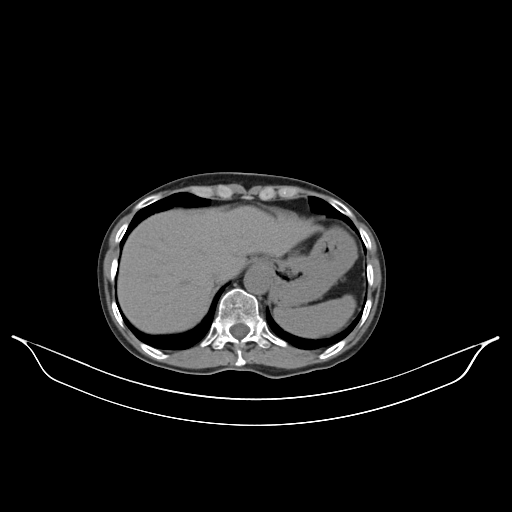
[im 79/85  lung]
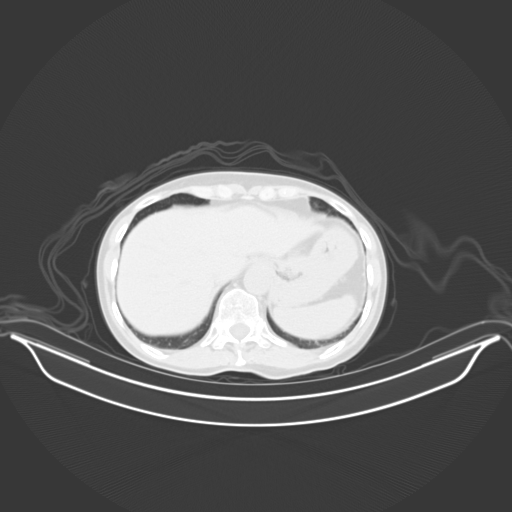
[im 82/85  lung]
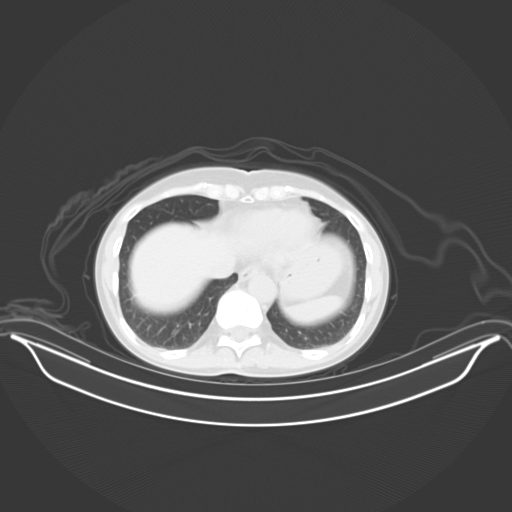

[Series 4: abd/pelvis w/o 5.0 sagittal · sagittal · non-contrast · 0.82mm/px · 1 of 57 slices shown, 2 images]
[im 19/57  soft-tissue]
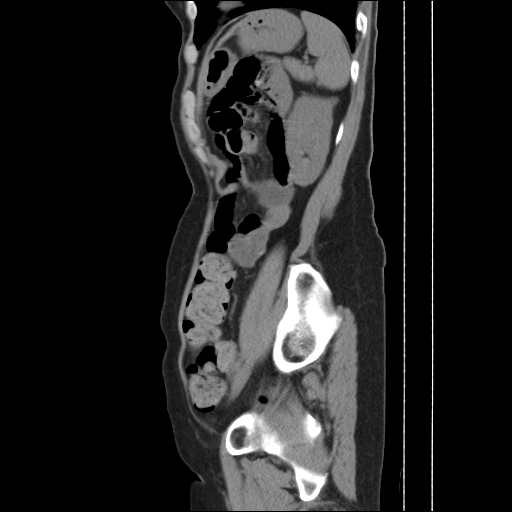
[im 19/57  bone]
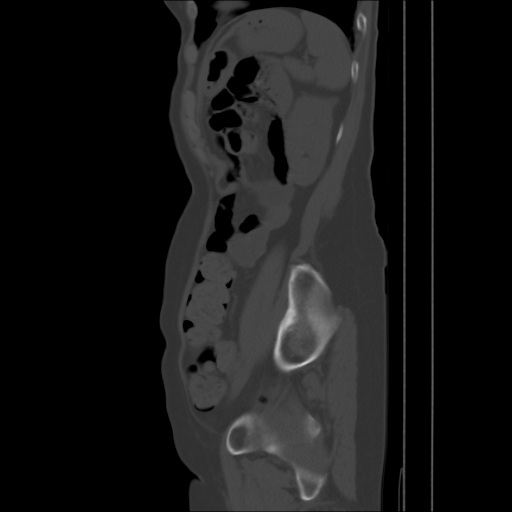

[15 of 36 positions shown; findings below may reference images not displayed]

DIAGNOSTIC STUDIES

EXAM

CT abdomen and pelvis without contrast

INDICATION

persistent microscopic hematuria
PT C/O PERSISTENT MICROSCOPIC HEMATURIA. TJ

TECHNIQUE

Volumetric multidetector CT images of the abdomen and pelvis were obtained without administration
of intravenous contrast.

All CT scans at this facility use dose modulation, iterative reconstruction, and/or weight based
dosing when appropriate to reduce radiation dose to as low as reasonably achievable.

COMPARISONS

CT abdomen and pelvis April 03, 2015

FINDINGS

The visualized lung bases are clear . The liver, spleen, gallbladder, adrenal glands and stomach
are unremarkable. The pancreas is unremarkable. There is no epigastric, para-aoritc, mesenteric, or
pelvic adenopathy. The aorta demonstrates minimal atherosclerotic calcifications and is non
aneurysmal. The kidneys demonstrate no evidence of hydronephrosis or distal obstructive calculus.
There is a minimally exophytic appearing cyst arising from the interpolar left kidney. The distal
colon demonstrates moderate to severe stool seen throughout the colon. There are calcified uterine
fibroids appreciated. The appendix is normal. The remaining hollow and solid abdominal and pelvic
viscera are grossly unremarkable. The lumbar spine demonstrates mild multilevel degenerative
changes without acute osseous abnormality.

IMPRESSION

1. No evidence of obstructive uropathy.

2. Moderate to severe stool seen throughout the colon.

3. If pain and or clinical symptoms persist consider further evaluation with urographic CT to
assess for filling defects within the collecting system.

## 2016-11-29 ENCOUNTER — Encounter: Admit: 2016-11-29 | Discharge: 2016-11-29 | Payer: MEDICARE

## 2016-11-29 ENCOUNTER — Ambulatory Visit: Admit: 2016-11-29 | Discharge: 2016-11-30 | Payer: MEDICARE

## 2016-11-29 DIAGNOSIS — M542 Cervicalgia: Principal | ICD-10-CM

## 2016-11-29 DIAGNOSIS — R51 Headache: Principal | ICD-10-CM

## 2016-11-29 MED ORDER — BUPIVACAINE (PF) 0.25 % (2.5 MG/ML) IJ SOLN
5 mL | Freq: Once | INTRAMUSCULAR | 0 refills | Status: CP
Start: 2016-11-29 — End: ?
  Administered 2016-11-29: 18:00:00 5 mL via INTRAMUSCULAR

## 2016-11-29 MED ORDER — TRIAMCINOLONE ACETONIDE 40 MG/ML IJ SUSP
80 mg | Freq: Once | EPIDURAL | 0 refills | Status: CP
Start: 2016-11-29 — End: ?
  Administered 2016-11-29: 18:00:00 80 mg via EPIDURAL

## 2016-11-29 MED ORDER — IOPAMIDOL 41 % IT SOLN
2.5 mL | Freq: Once | EPIDURAL | 0 refills | Status: CP
Start: 2016-11-29 — End: ?
  Administered 2016-11-29: 18:00:00 2.5 mL via EPIDURAL

## 2016-11-29 NOTE — Progress Notes
[facility]  INTERVENTIONAL PAIN PROCEDURE HISTORY AND PHYSICAL    Chief Complaint   Patient presents with   ??? Right Shoulder - Pain   ??? Neck - Pain       HISTORY OF PRESENT ILLNESS:  Neck pain and radicular pain. Also continues to have muscle tension/tightness in the shoulder, previously alleviated with TPI.     Past Medical History:   Diagnosis Date   ??? Generalized headaches        Past Surgical History:   Procedure Laterality Date   ??? ANKLE SURGERY     ??? HEART CATHETERIZATION     ??? TUBAL LIGATION         family history includes Arthritis in her mother; Cancer in her father.    Social History     Social History   ??? Marital status: Married     Spouse name: N/A   ??? Number of children: N/A   ??? Years of education: N/A     Occupational History   ??? Not on file.     Social History Main Topics   ??? Smoking status: Never Smoker   ??? Smokeless tobacco: Never Used   ??? Alcohol use Not on file   ??? Drug use: Unknown   ??? Sexual activity: Not on file     Other Topics Concern   ??? Not on file     Social History Narrative   ??? No narrative on file       Allergies   Allergen Reactions   ??? Isometheptene CHEST TIGHTNESS, CHILLS and MUSCLE PAIN   ??? Sulfa (Sulfonamide Antibiotics) UNKNOWN       Vitals:    11/29/16 1157   BP: 120/80   Pulse: 66   Resp: 16   Temp: 36.6 ???C (97.8 ???F)   TempSrc: Oral   SpO2: 99%   Weight: 49.4 kg (109 lb)   Height: 162.6 cm (64)       REVIEW OF SYSTEMS: 10 point ROS obtained and negative      PHYSICAL EXAM:    General: The patient is a well-developed, well nourished 66 y.o. female in no acute distress.   HEENT: Head is normocephalic and atraumatic. Pupils are equal and reactive to light bilaterally.   Cardiac: Based on palpation, pulse appears to be regular rate and rhythm.   Pulmonary: The patient has unlabored respirations and bilateral symmetric chest excursion.   Abdomen: Soft, nontender, and nondistended. There is no rebound or guarding.   Extremities: No clubbing, cyanosis, or edema.     Neurologic: The patient is alert and oriented times 3.     Musculoskeletal:     C-Spine   There is mod paraspinal tenderness. Paraspinal muscle tone is increased.  ROM with flexion, extension, rotation, and lateral bending is intact but stiff. Radicular symptoms with ROM.  Strength is equal and adequate bilaterally in the flexors and extensors of the bilateral upper extremities.   Hoffman's is negative bilaterally.        IMPRESSION:    1. Cervical radiculopathy    2. Myalgia    3. Spasm of muscle    4. DDD (degenerative disc disease), cervical    5. Myalgia of auxiliary muscles, head and neck         PLAN: Cervical Epidural Steroid Injection and Shoulder TPI

## 2016-11-30 ENCOUNTER — Ambulatory Visit: Admit: 2016-11-29 | Discharge: 2016-11-30 | Payer: MEDICARE

## 2016-11-30 DIAGNOSIS — M25511 Pain in right shoulder: ICD-10-CM

## 2016-11-30 DIAGNOSIS — M7912 Myalgia of auxiliary muscles, head and neck: ICD-10-CM

## 2016-11-30 DIAGNOSIS — M791 Myalgia, unspecified site: ICD-10-CM

## 2016-11-30 DIAGNOSIS — M7918 Myalgia, other site: ICD-10-CM

## 2016-11-30 DIAGNOSIS — M5010 Cervical disc disorder with radiculopathy, unspecified cervical region: Principal | ICD-10-CM

## 2016-12-02 NOTE — Procedures
Attending Surgeon: Evelina Bucy, MD    Anesthesia: Local    Pre-Procedure Diagnosis:   1. Cervical radiculopathy    2. Myalgia    3. Spasm of muscle    4. DDD (degenerative disc disease), cervical    5. Myalgia of auxiliary muscles, head and neck        Post-Procedure Diagnosis:   1. Cervical radiculopathy    2. Myalgia    3. Spasm of muscle    4. DDD (degenerative disc disease), cervical    5. Myalgia of auxiliary muscles, head and neck        [place] [facility] SPINE INJECT INTERLAM CRV/THRC  Procedure: epidural - interlaminar    Laterality: n/a  Location: cervical ESI with imaging - C7-T1      Consent:   Consent obtained: written  Consent given by: patient  Risks discussed: allergic reaction, bleeding, bruising, infection, never damage, no change or worsening in pain, pneumo thorax, reaction to medication, seizure, swelling and weakness  Alternatives discussed: alternative treatment, delayed treatment and no treatment  Discussed with patient the purpose of the treatment/procedure, other ways of treating my condition, including no treatment/ procedure and the risks and benefits of the alternatives. Patient has decided to proceed with treatment/procedure.        Universal Protocol:  Relevant documents: relevant documents present and verified  Site marked: the operative site was marked  Patient identity confirmed: Patient identify confirmed verbally with patient.      Time out: Immediately prior to procedure a time out was called to verify the correct patient, procedure, equipment, support staff and site/side marked as required      Procedures Details:   Indications: pain   Prep: chlorhexidine  Patient position: prone  Estimated Blood Loss: minimal  Specimens: none  Amount Injected:   C7-T1: 4mL    Number of Levels: 1  Approach: midline  Guidance: fluoroscopy  Contrast: Procedure confirmed with contrast under live fluoroscopy.  Needle and Epidural Catheter: tuohy  Needle size: 18 G Injection procedure: Incremental injection and Negative aspiration for blood  Patient tolerance: Patient tolerated the procedure well with no immediate complications. Pressure was applied, and hemostasis was accomplished.  Outcome: Pain unchanged  Comments: CERVICAL INTERLAMINAR EPIDURAL STEROID INJECTION    PROCEDURE:  1) C7-T1 interlaminar epidural steroid injection  2) Fluoroscopic needle guidance    REASON FOR PROCEDURE: Cervical radiculopathy, DDD (cervical)    ATTENDING PHYSICIAN: Evelina Bucy, MD    MEDICATIONS INJECTED: 2 mL of triamciniolone (80 mg) and 2 mL of sterile, preservative-free normal saline    LOCAL ANESTHETIC INJECTED: 1 mL of 1% lidocaine    SEDATION MEDICATIONS: None    ESTIMATED BLOOD LOSS: None    SPECIMENS REMOVED: None    COMPLICATIONS: None    TECHNIQUE: Time-out was taken to identify the correct patient, procedure and side prior to starting the procedure. With the patient lying in a prone position with the neck in a slightly  flexed position, the area was prepped and draped in sterile fashion using DuraPrep and a fenestrated drape. The area was determined under fluoroscopic guidance. A 27-gauge, 1.25-inch needle was used to anesthetize the needle entry site and subcutaneous tissues.     The 18-gauge, 3.5-inch Tuohy needle was advanced through the ligamentum flavum using loss of resistance technique. Once the tip of the needle was thought to be in the desired position, contrast was injected to confirm only epidural spread and no vascular runoff via A-P and lateral views. The injectate  was then injected slowly with intermittent negative aspiration.    The procedure was completed without complications and was tolerated well. The patient was monitored after the procedure. The patient (or responsible party) was given post-procedure and discharge instructions to follow at home. The patient was discharged in stable condition.    Evelina Bucy, MD, MBA      Trigger Point Injection Locations: R upper trapezius, R levator scapulae and R rotator cuff  Consent:   Consent obtained: written  Consent given by: patient  Alternatives discussed: alternative treatment, delayed treatment and no treatment  Discussed with patient the purpose of the treatment/procedure, other ways of treating my condition, including no treatment/ procedure and the risks and benefits of the alternatives. Patient has decided to proceed with treatment/procedure.        Universal Protocol:  Relevant documents: relevant documents present and verified  Site marked: the operative site was marked  Patient identity confirmed: Patient identify confirmed verbally with patient.      Time out: Immediately prior to procedure a time out was called to verify the correct patient, procedure, equipment, support staff and site/side marked as required      Procedures Details:   Indications: muscle spasm, myalgia and pain in thoracic spinePrep: 2% chlorhexidine  Needle size: 27 G  Number of muscles: 3 or more  Approach: posterior  Patient tolerance: Patient tolerated the procedure well with no immediate complications. Pressure was applied, and hemostasis was accomplished.  Comments: PROCEDURE: Trigger Point Injections    Pre-Procedure Diagnoses:  1. Myalgia  2. Myofascial pain  3. Muscle spasm    Post-Procedure Diagnoses:  Same    Physician: Evelina Bucy, MD    ANESTHESIA: 50:50 solution of Bupivacaine 0.25% and lidocaine 2%    COMPLICATIONS: None.    Location(s) of pain: As noted above  Presence of point tenderness in a tight band of muscle in above area: Yes  Restricted range of motion: Yes  Conservative therapy attempted for at least 6 weeks: activity modification, pharmacotherapy  Other components of comprehensive management: pharmacotherapy, activity modification    DESCRIPTION OF PROCEDURE: The procedure risks, hazards and alternatives were discussed with the patient and a proper consent was obtained. The area over each trigger point was prepped with alcohol utilizing sterile technique. After isolating it between two palpating fingertips a 27-gauge 1.5 needle was placed in the center of the myofascial spasms and a negative aspiration was performed. Then 0.5cc of the solution above was injected into each trigger point.     Trigger points in each of the muscle groups noted above were injected. A total of 4 ml of the local anesthetic solution was utilized.    The patient tolerated the procedure well without any apparent difficulties or complications.        Estimated blood loss: none or minimal  Specimens: none  Patient tolerated the procedure well with no immediate complications. Pressure was applied, and hemostasis was accomplished.

## 2017-02-28 ENCOUNTER — Ambulatory Visit: Admit: 2017-02-28 | Discharge: 2017-03-01 | Payer: MEDICARE

## 2017-02-28 ENCOUNTER — Encounter: Admit: 2017-02-28 | Discharge: 2017-02-28 | Payer: MEDICARE

## 2017-02-28 DIAGNOSIS — M542 Cervicalgia: Principal | ICD-10-CM

## 2017-02-28 DIAGNOSIS — M5412 Radiculopathy, cervical region: Principal | ICD-10-CM

## 2017-02-28 DIAGNOSIS — R51 Headache: Principal | ICD-10-CM

## 2017-02-28 MED ORDER — TRIAMCINOLONE ACETONIDE 40 MG/ML IJ SUSP
80 mg | Freq: Once | EPIDURAL | 0 refills | Status: CP
Start: 2017-02-28 — End: ?
  Administered 2017-02-28: 19:00:00 80 mg via EPIDURAL

## 2017-02-28 MED ORDER — IOPAMIDOL 41 % IT SOLN
2.5 mL | Freq: Once | EPIDURAL | 0 refills | Status: CP
Start: 2017-02-28 — End: ?
  Administered 2017-02-28: 19:00:00 2.5 mL via EPIDURAL

## 2017-03-01 DIAGNOSIS — Z882 Allergy status to sulfonamides status: ICD-10-CM

## 2017-03-01 DIAGNOSIS — M5030 Other cervical disc degeneration, unspecified cervical region: ICD-10-CM

## 2017-03-01 DIAGNOSIS — M5412 Radiculopathy, cervical region: Principal | ICD-10-CM

## 2017-03-01 DIAGNOSIS — M7912 Myalgia of auxiliary muscles, head and neck: ICD-10-CM

## 2017-03-01 DIAGNOSIS — M7918 Myalgia, other site: ICD-10-CM

## 2017-03-10 IMAGING — CR CHEST
1 series · 1 of 1 positions shown · non-contrast
Comparison: none

[chest port]
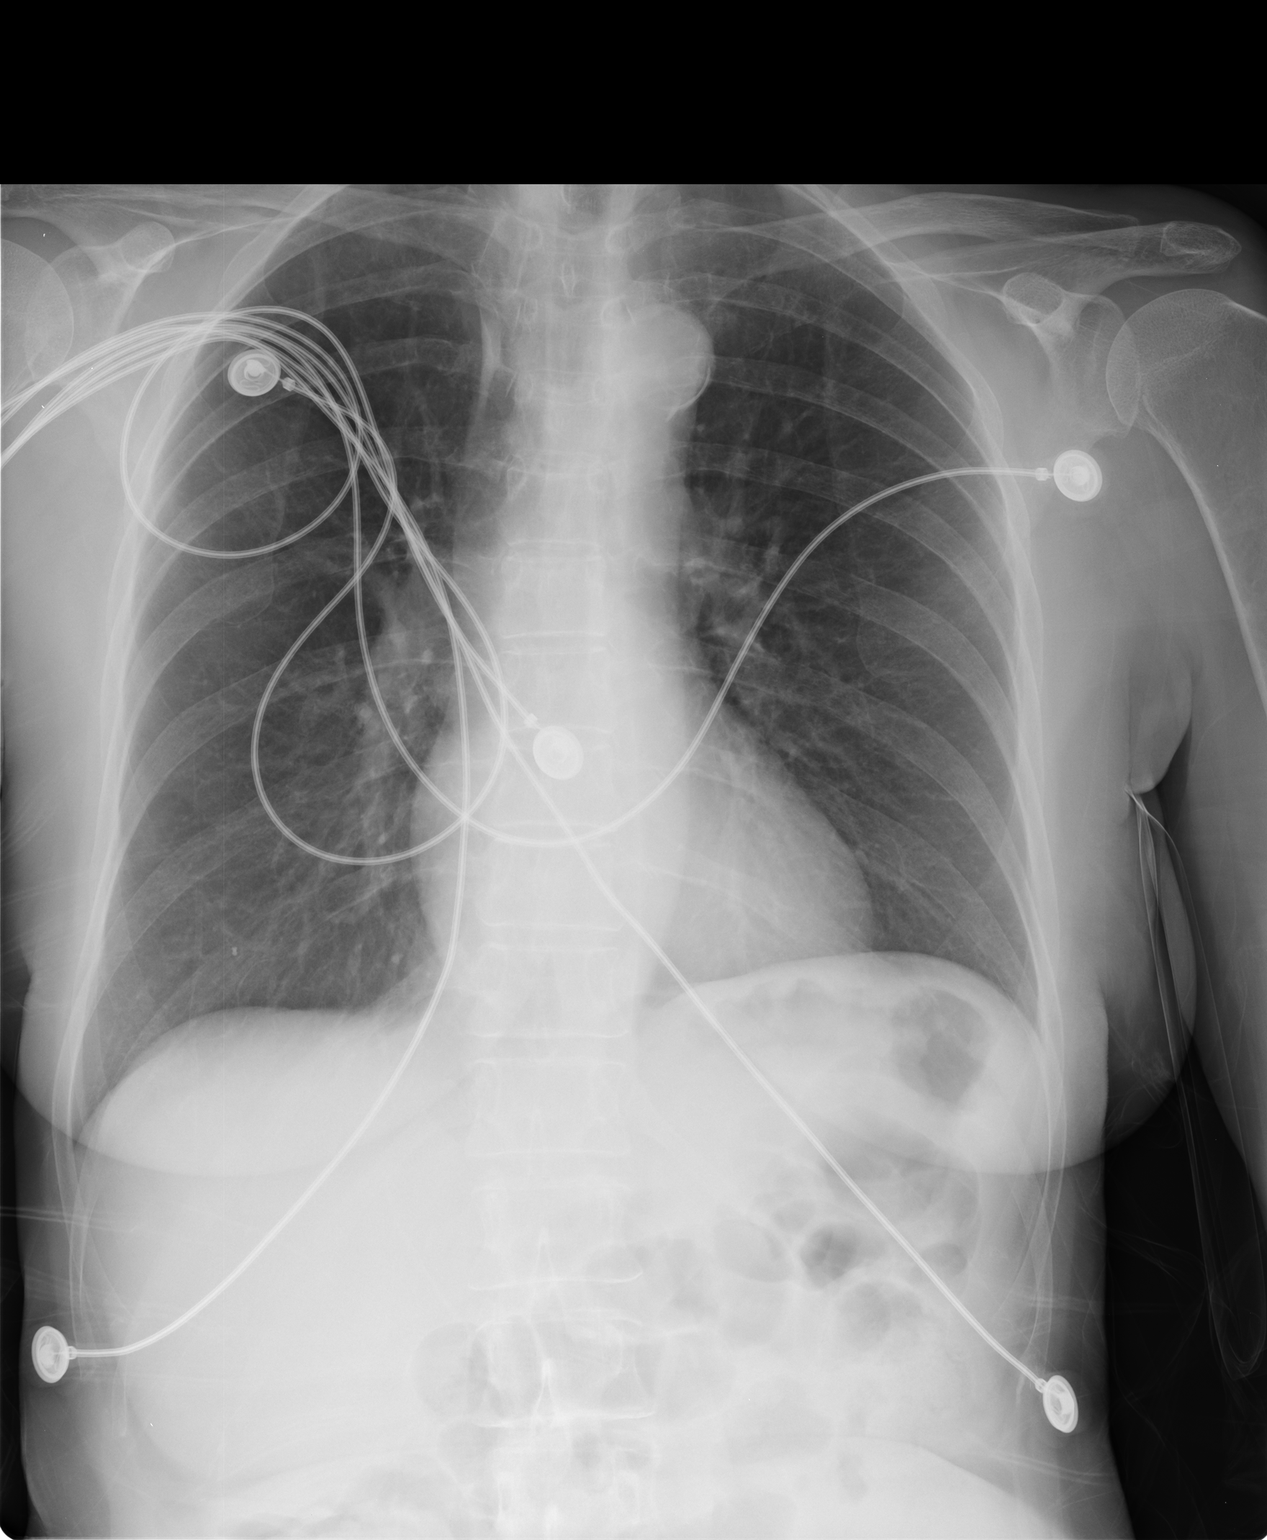

[1 of 1 positions shown; findings below may reference images not displayed]

DIAGNOSTIC STUDIES

EXAM

RADIOLOGICAL EXAMINATION, CHEST; SINGLE VIEW, FRONTAL CPT 66363

INDICATION

chest pain
CHEST PAIN THIS MORNING. NO CHRONIC HEART OR LUNG PROBLEMS. AB

TECHNIQUE

Single AP view chest

COMPARISONS

No prior studies are available for comparison.

FINDINGS

There is no focal consolidation, effusion, or pneumothorax. Cardiac silhouette is within normal
limits. The bony thorax is intact.

IMPRESSION

No acute cardiopulmonary abnormality.

## 2017-04-18 ENCOUNTER — Encounter: Admit: 2017-04-18 | Discharge: 2017-04-18 | Payer: MEDICARE

## 2017-04-18 DIAGNOSIS — Z1231 Encounter for screening mammogram for malignant neoplasm of breast: Principal | ICD-10-CM

## 2017-05-03 ENCOUNTER — Ambulatory Visit: Admit: 2017-05-03 | Discharge: 2017-05-03 | Payer: MEDICARE

## 2017-05-03 ENCOUNTER — Encounter: Admit: 2017-05-03 | Discharge: 2017-05-03 | Payer: MEDICARE

## 2017-05-03 DIAGNOSIS — Z1231 Encounter for screening mammogram for malignant neoplasm of breast: Principal | ICD-10-CM

## 2017-05-30 ENCOUNTER — Encounter: Admit: 2017-05-30 | Discharge: 2017-05-30 | Payer: MEDICARE

## 2017-05-30 ENCOUNTER — Ambulatory Visit: Admit: 2017-05-30 | Discharge: 2017-05-31 | Payer: MEDICARE

## 2017-05-30 DIAGNOSIS — R51 Headache: Principal | ICD-10-CM

## 2017-05-30 DIAGNOSIS — M542 Cervicalgia: Principal | ICD-10-CM

## 2017-05-30 MED ORDER — TRIAMCINOLONE ACETONIDE 40 MG/ML IJ SUSP
80 mg | Freq: Once | EPIDURAL | 0 refills | Status: CP
Start: 2017-05-30 — End: ?
  Administered 2017-05-30: 18:00:00 80 mg via EPIDURAL

## 2017-05-30 MED ORDER — IOPAMIDOL 41 % IT SOLN
2.5 mL | Freq: Once | EPIDURAL | 0 refills | Status: CP
Start: 2017-05-30 — End: ?
  Administered 2017-05-30: 18:00:00 2.5 mL via EPIDURAL

## 2017-05-31 DIAGNOSIS — M5412 Radiculopathy, cervical region: Principal | ICD-10-CM

## 2017-05-31 DIAGNOSIS — M5030 Other cervical disc degeneration, unspecified cervical region: ICD-10-CM

## 2017-05-31 DIAGNOSIS — M4602 Spinal enthesopathy, cervical region: ICD-10-CM

## 2017-05-31 DIAGNOSIS — Z882 Allergy status to sulfonamides status: ICD-10-CM

## 2017-06-06 IMAGING — CR UP_EXM
3 series · 3 of 3 positions shown · non-contrast
Comparison: none

[hand]
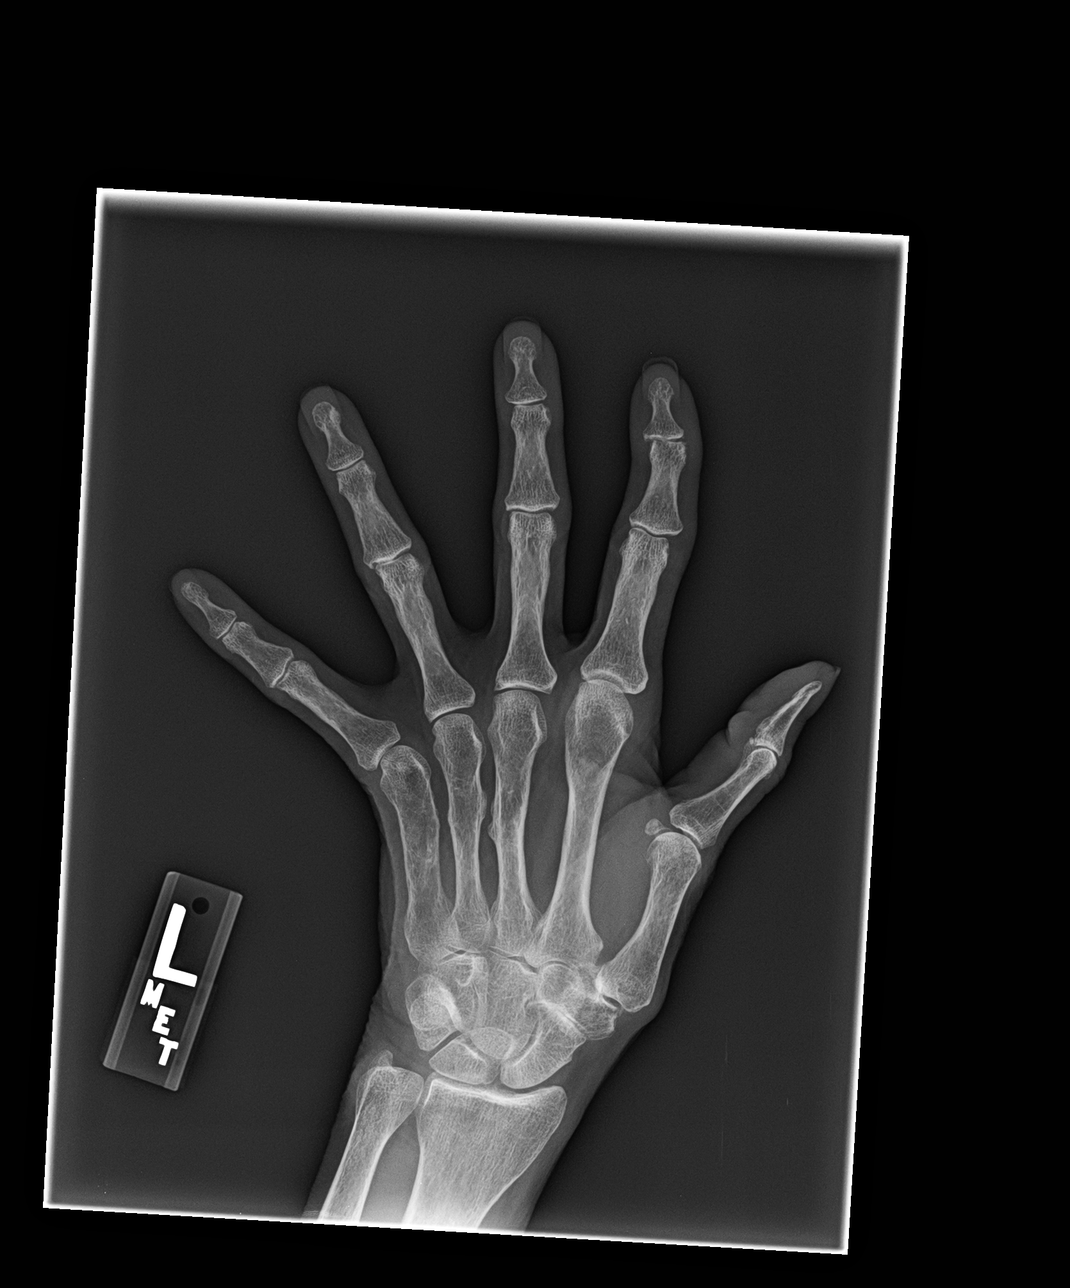

[hand obl]
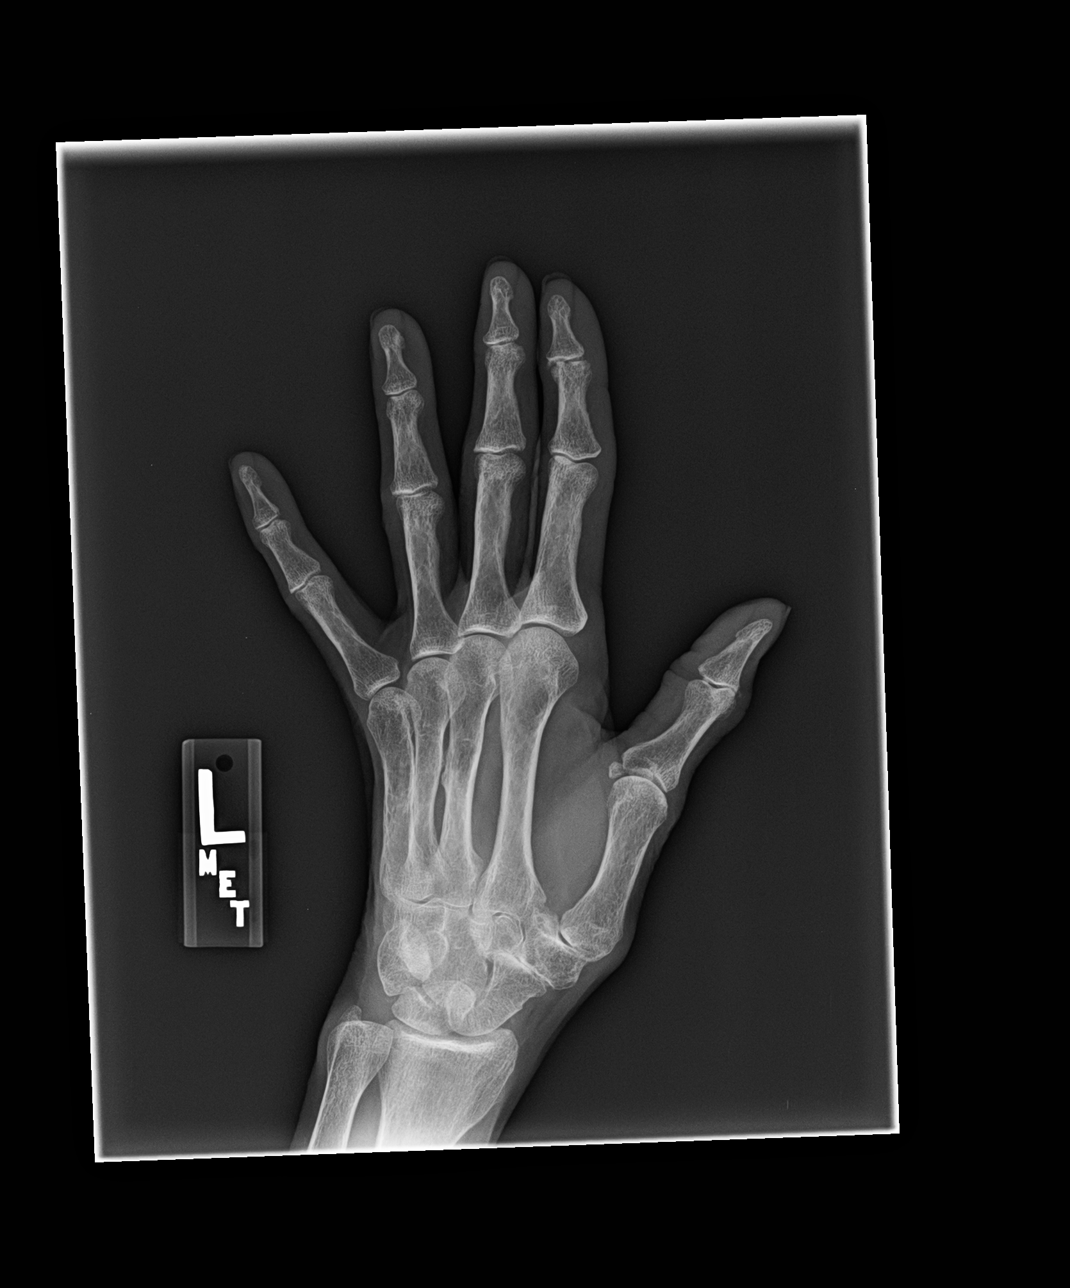

[hand lat]
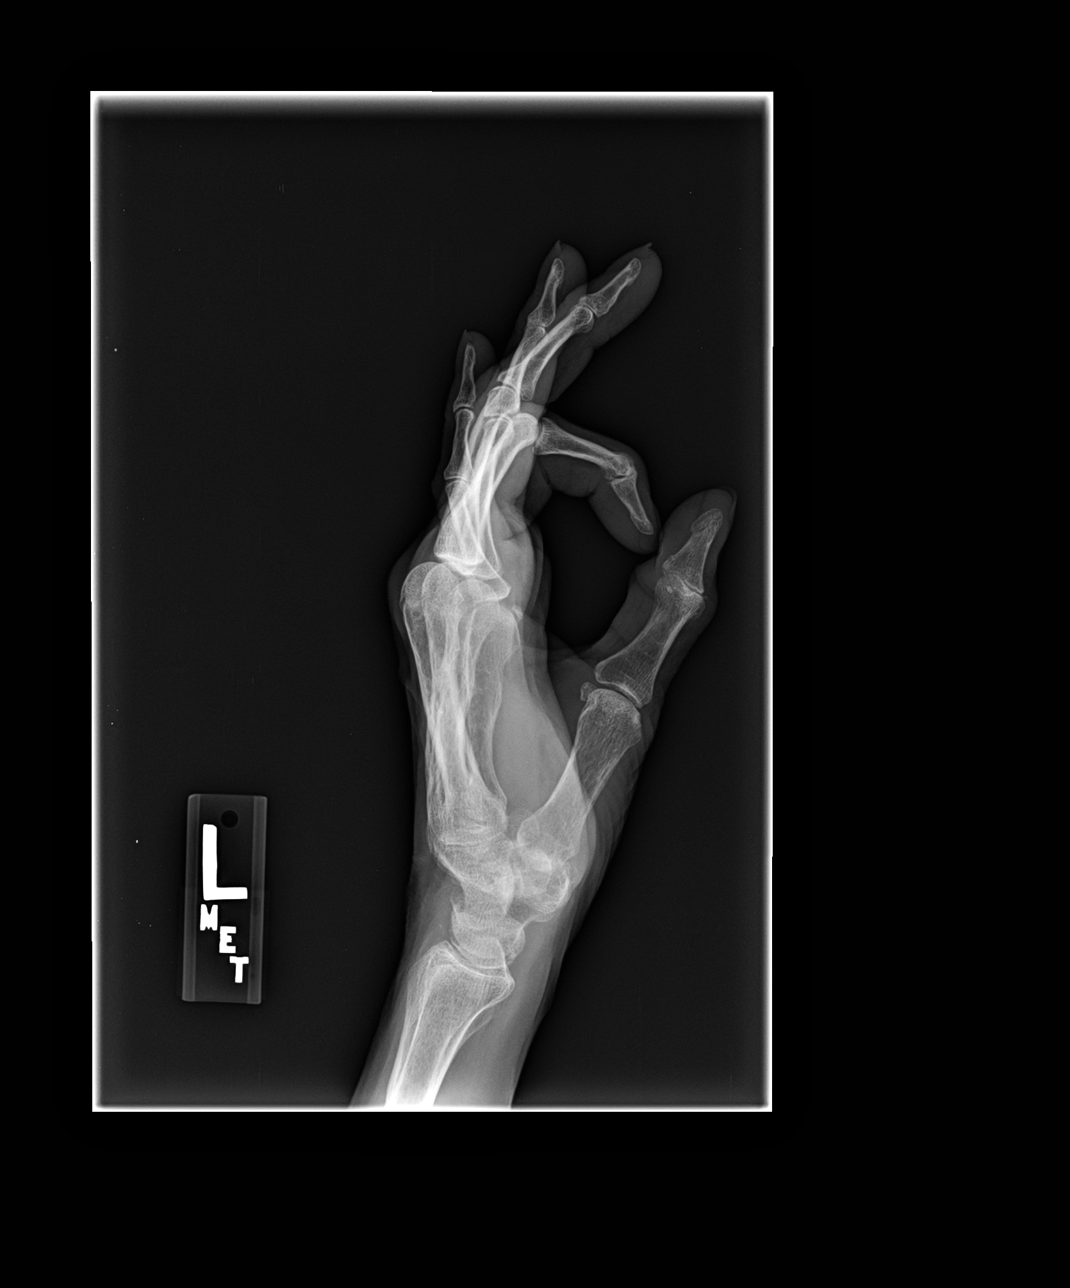

[3 of 3 positions shown; findings below may reference images not displayed]

DIAGNOSTIC STUDIES

EXAM

Right hand.

INDICATION

hand pain
PT. STATES BILATERAL HAND PAIN , PT. STATES HER MOTHER HAS HX OF RHEUMATOID ARTHRITIS, PAIN IN 1ST
MCP JOINT

TECHNIQUE

PA, oblique, and lateral right hand views.

COMPARISONS

None.

FINDINGS

Bone demineralization. Interphalangeal osteoarthropathy with mild ulnar subluxation of the 2nd
distal phalanx. Chondrocalcinosis of the triangular fibrocartilage complex and radiocarpal articul
ation as well as other intercarpal articulations suggesting CPPD. No displaced fracture. No
aggressive osseous lesion. There is subtle pericaval erosive change at the ulnar aspect of the
distal interphalangeal greater than proximal interphalangeal joints. There is also trace
periarticular erosive change at the ulnar aspect of the proximal phalanges, particularly involving
the 3rd and 4th rays.

IMPRESSION

Osteoarthropathy. Mild interphalangeal and MCP periarticular erosive change. No acute osseous
abnormality.

EXAM

Left hand.

INDICATION

hand pain
PT. STATES BILATERAL HAND PAIN , PT. STATES HER MOTHER HAS HX OF RHEUMATOID ARTHRITIS, PAIN IN 1ST
MCP JOINT

TECHNIQUE

PA, oblique, and lateral left hand views.

COMPARISONS

None.

FINDINGS

Diffuse bone demineralization. Interphalangeal osteoarthropathy. Mild periarticular erosive change,
particularly at the proximal interphalangeal joints. An old, healed fracture of the 5th metacarpal
is suggested. There is also evidence of old trauma of the 2nd and 3rd metacarpals. No acutely
displaced fracture. There is severe triscaphe osteoarthropathy.

IMPRESSION

No acute osseous abnormality. Evidence of old trauma. Osteoarthropathy with some periarticular
erosive changes also noted

Tech Notes:

PT. STATES BILATERAL HAND PAIN , PT. STATES HER MOTHER HAS HX OF RHEUMATOID ARTHRITIS, PAIN IN 1ST
MCP JOINT

## 2017-06-06 IMAGING — CR UP_EXM
3 series · 3 of 3 positions shown · non-contrast
Comparison: none

[hand]
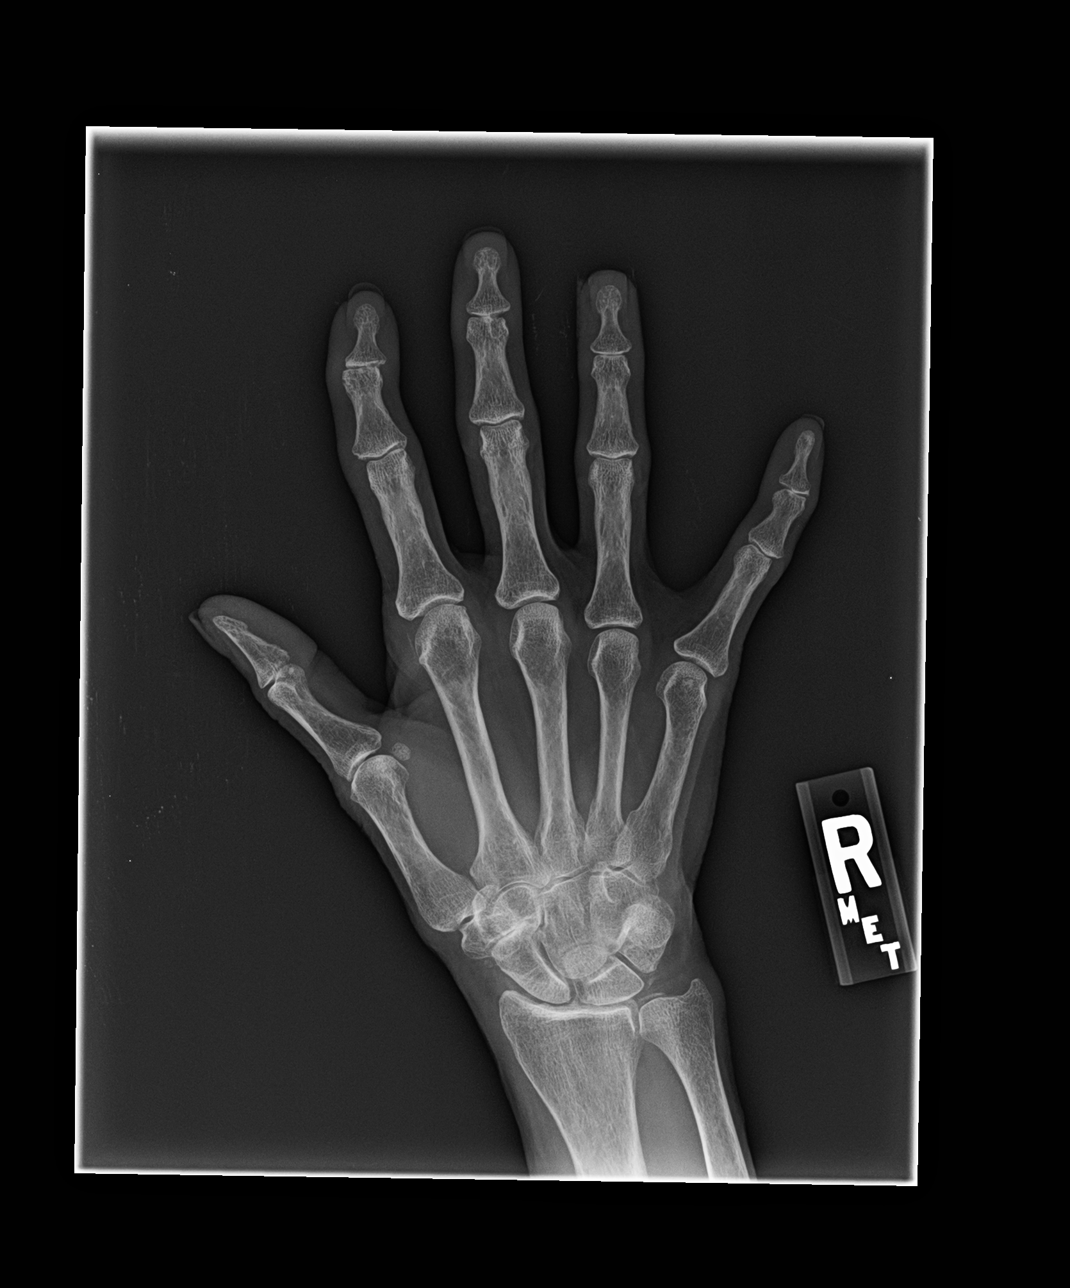

[hand obl]
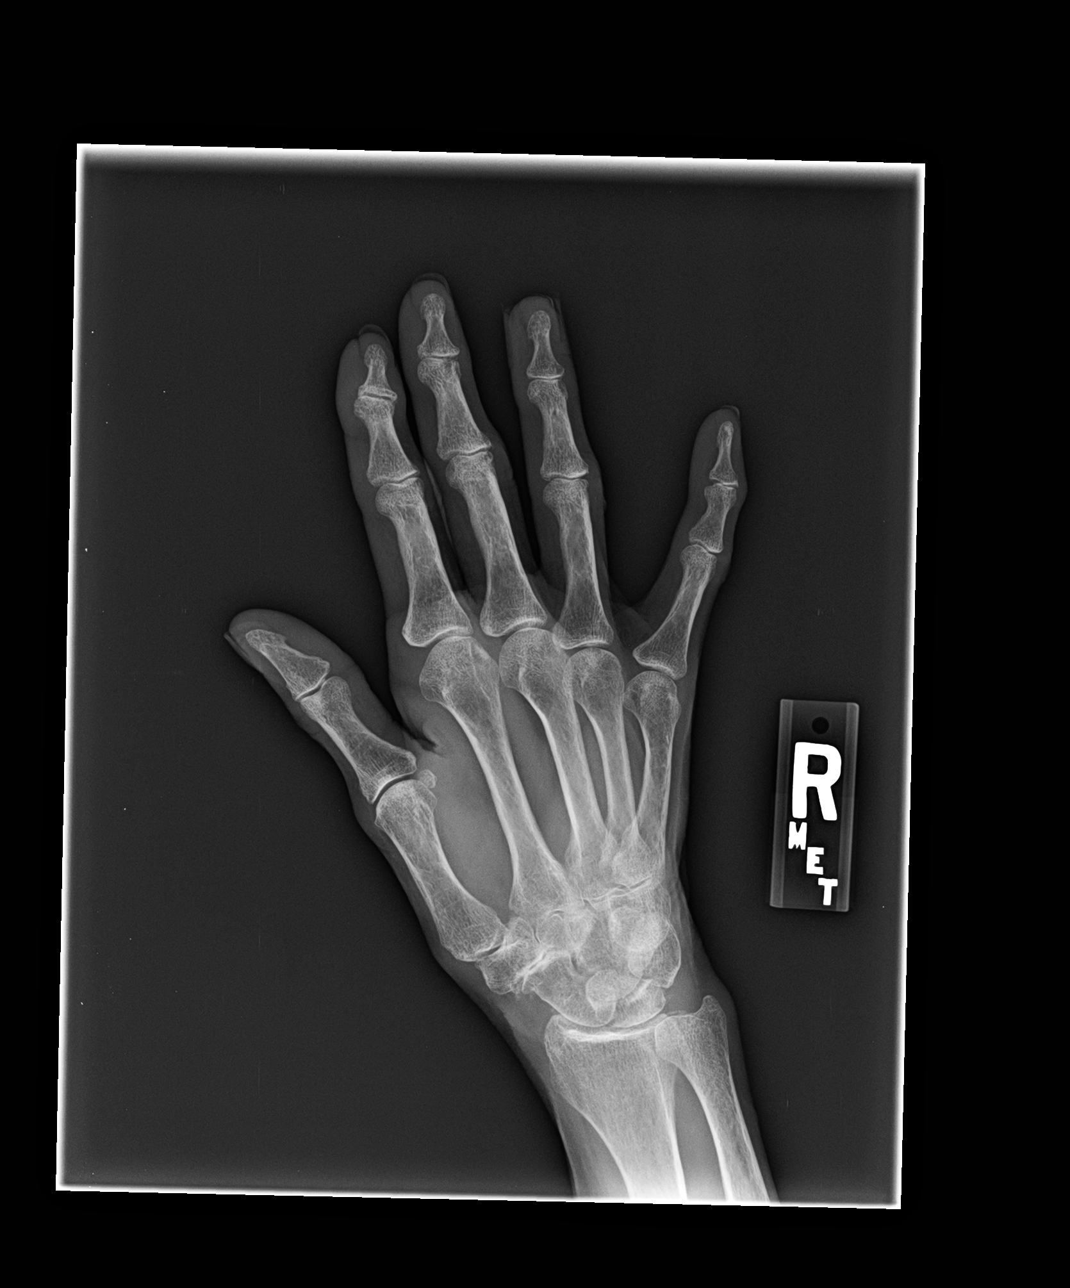

[hand lat]
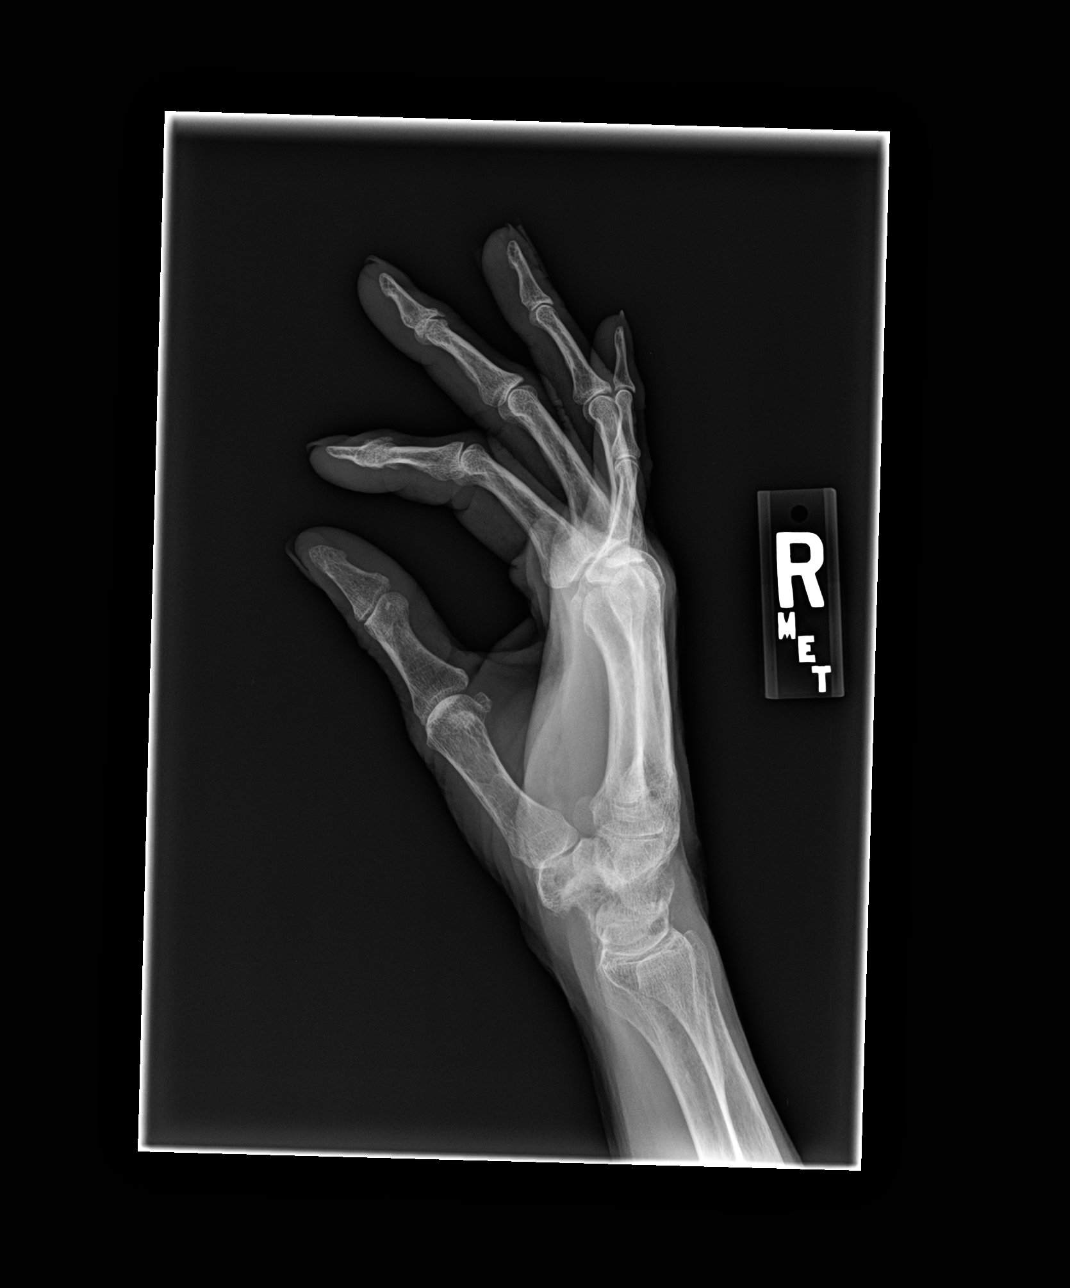

[3 of 3 positions shown; findings below may reference images not displayed]

DIAGNOSTIC STUDIES

EXAM

Right hand.

INDICATION

hand pain
PT. STATES BILATERAL HAND PAIN , PT. STATES HER MOTHER HAS HX OF RHEUMATOID ARTHRITIS, PAIN IN 1ST
MCP JOINT

TECHNIQUE

PA, oblique, and lateral right hand views.

COMPARISONS

None.

FINDINGS

Bone demineralization. Interphalangeal osteoarthropathy with mild ulnar subluxation of the 2nd
distal phalanx. Chondrocalcinosis of the triangular fibrocartilage complex and radiocarpal articul
ation as well as other intercarpal articulations suggesting CPPD. No displaced fracture. No
aggressive osseous lesion. There is subtle pericaval erosive change at the ulnar aspect of the
distal interphalangeal greater than proximal interphalangeal joints. There is also trace
periarticular erosive change at the ulnar aspect of the proximal phalanges, particularly involving
the 3rd and 4th rays.

IMPRESSION

Osteoarthropathy. Mild interphalangeal and MCP periarticular erosive change. No acute osseous
abnormality.

EXAM

Left hand.

INDICATION

hand pain
PT. STATES BILATERAL HAND PAIN , PT. STATES HER MOTHER HAS HX OF RHEUMATOID ARTHRITIS, PAIN IN 1ST
MCP JOINT

TECHNIQUE

PA, oblique, and lateral left hand views.

COMPARISONS

None.

FINDINGS

Diffuse bone demineralization. Interphalangeal osteoarthropathy. Mild periarticular erosive change,
particularly at the proximal interphalangeal joints. An old, healed fracture of the 5th metacarpal
is suggested. There is also evidence of old trauma of the 2nd and 3rd metacarpals. No acutely
displaced fracture. There is severe triscaphe osteoarthropathy.

IMPRESSION

No acute osseous abnormality. Evidence of old trauma. Osteoarthropathy with some periarticular
erosive changes also noted

Tech Notes:

PT. STATES BILATERAL HAND PAIN , PT. STATES HER MOTHER HAS HX OF RHEUMATOID ARTHRITIS, PAIN IN 1ST
MCP JOINT

## 2017-08-22 ENCOUNTER — Encounter: Admit: 2017-08-22 | Discharge: 2017-08-22 | Payer: MEDICARE

## 2017-08-22 DIAGNOSIS — Z1231 Encounter for screening mammogram for malignant neoplasm of breast: Principal | ICD-10-CM

## 2017-09-05 ENCOUNTER — Encounter: Admit: 2017-09-05 | Discharge: 2017-09-05 | Payer: MEDICARE

## 2017-09-05 ENCOUNTER — Ambulatory Visit: Admit: 2017-09-05 | Discharge: 2017-09-06 | Payer: MEDICARE

## 2017-09-05 DIAGNOSIS — M5412 Radiculopathy, cervical region: Principal | ICD-10-CM

## 2017-09-05 DIAGNOSIS — M542 Cervicalgia: Principal | ICD-10-CM

## 2017-09-05 DIAGNOSIS — R51 Headache: Principal | ICD-10-CM

## 2017-09-05 MED ORDER — TRIAMCINOLONE ACETONIDE 40 MG/ML IJ SUSP
80 mg | Freq: Once | INTRAMUSCULAR | 0 refills | Status: CP
Start: 2017-09-05 — End: ?
  Administered 2017-09-05: 18:00:00 80 mg via INTRAMUSCULAR

## 2017-09-05 MED ORDER — IOPAMIDOL 41 % IT SOLN
2.5 mL | Freq: Once | EPIDURAL | 0 refills | Status: CP
Start: 2017-09-05 — End: ?
  Administered 2017-09-05: 18:00:00 2.5 mL via EPIDURAL

## 2017-09-06 ENCOUNTER — Encounter: Admit: 2017-09-06 | Discharge: 2017-09-06 | Payer: MEDICARE

## 2017-09-06 DIAGNOSIS — M5030 Other cervical disc degeneration, unspecified cervical region: ICD-10-CM

## 2017-09-06 DIAGNOSIS — M7912 Myalgia of auxiliary muscles, head and neck: ICD-10-CM

## 2017-09-06 DIAGNOSIS — M7918 Myalgia, other site: Secondary | ICD-10-CM

## 2017-09-06 DIAGNOSIS — Z882 Allergy status to sulfonamides status: ICD-10-CM

## 2017-09-06 DIAGNOSIS — R51 Headache: Principal | ICD-10-CM

## 2017-09-06 DIAGNOSIS — M5412 Radiculopathy, cervical region: Principal | ICD-10-CM

## 2017-09-16 ENCOUNTER — Encounter: Admit: 2017-09-16 | Discharge: 2017-09-16 | Payer: MEDICARE

## 2017-09-16 DIAGNOSIS — R922 Inconclusive mammogram: Principal | ICD-10-CM

## 2017-10-04 ENCOUNTER — Encounter: Admit: 2017-10-04 | Discharge: 2017-10-04 | Payer: MEDICARE

## 2017-10-04 DIAGNOSIS — R922 Inconclusive mammogram: Principal | ICD-10-CM

## 2017-11-22 ENCOUNTER — Ambulatory Visit: Admit: 2017-11-22 | Discharge: 2017-11-23 | Payer: MEDICARE

## 2017-11-22 ENCOUNTER — Encounter: Admit: 2017-11-22 | Discharge: 2017-11-22 | Payer: MEDICARE

## 2017-11-22 DIAGNOSIS — R51 Headache: Principal | ICD-10-CM

## 2017-11-22 MED ORDER — GABAPENTIN 300 MG PO CAP
300 mg | ORAL_CAPSULE | Freq: Three times a day (TID) | ORAL | 3 refills | Status: AC
Start: 2017-11-22 — End: 2018-08-09

## 2017-11-23 DIAGNOSIS — G43709 Chronic migraine without aura, not intractable, without status migrainosus: Principal | ICD-10-CM

## 2017-12-05 ENCOUNTER — Encounter: Admit: 2017-12-05 | Discharge: 2017-12-05 | Payer: MEDICARE

## 2017-12-05 ENCOUNTER — Ambulatory Visit: Admit: 2017-12-05 | Discharge: 2017-12-06 | Payer: MEDICARE

## 2017-12-05 DIAGNOSIS — M5412 Radiculopathy, cervical region: ICD-10-CM

## 2017-12-05 DIAGNOSIS — R51 Headache: Principal | ICD-10-CM

## 2017-12-05 DIAGNOSIS — M62838 Other muscle spasm: ICD-10-CM

## 2017-12-05 DIAGNOSIS — M542 Cervicalgia: ICD-10-CM

## 2017-12-05 DIAGNOSIS — M5030 Other cervical disc degeneration, unspecified cervical region: ICD-10-CM

## 2017-12-05 MED ORDER — IOPAMIDOL 41 % IT SOLN
2.5 mL | Freq: Once | EPIDURAL | 0 refills | Status: CP
Start: 2017-12-05 — End: ?
  Administered 2017-12-05: 18:00:00 2.5 mL via EPIDURAL

## 2017-12-05 MED ORDER — TRIAMCINOLONE ACETONIDE 40 MG/ML IJ SUSP
80 mg | Freq: Once | EPIDURAL | 0 refills | Status: CP
Start: 2017-12-05 — End: ?
  Administered 2017-12-05: 18:00:00 80 mg via EPIDURAL

## 2017-12-06 DIAGNOSIS — Z882 Allergy status to sulfonamides status: ICD-10-CM

## 2017-12-06 DIAGNOSIS — M5010 Cervical disc disorder with radiculopathy, unspecified cervical region: Principal | ICD-10-CM

## 2017-12-06 DIAGNOSIS — M7912 Myalgia of auxiliary muscles, head and neck: ICD-10-CM

## 2017-12-06 DIAGNOSIS — M7918 Myalgia, other site: ICD-10-CM

## 2018-01-16 ENCOUNTER — Encounter: Admit: 2018-01-16 | Discharge: 2018-01-16 | Payer: MEDICARE

## 2018-01-16 ENCOUNTER — Ambulatory Visit: Admit: 2018-01-16 | Discharge: 2018-01-17 | Payer: MEDICARE

## 2018-01-16 DIAGNOSIS — R51 Headache: Principal | ICD-10-CM

## 2018-01-16 DIAGNOSIS — M5030 Other cervical disc degeneration, unspecified cervical region: Secondary | ICD-10-CM

## 2018-01-17 DIAGNOSIS — M5412 Radiculopathy, cervical region: Principal | ICD-10-CM

## 2018-01-17 DIAGNOSIS — M7918 Myalgia, other site: ICD-10-CM

## 2018-02-24 ENCOUNTER — Encounter: Admit: 2018-02-24 | Discharge: 2018-02-24 | Payer: MEDICARE

## 2018-02-24 DIAGNOSIS — M5412 Radiculopathy, cervical region: Secondary | ICD-10-CM

## 2018-02-28 IMAGING — CR PELVIS
3 series · 3 of 3 positions shown · non-contrast
Comparison: none

[l-spine ap]
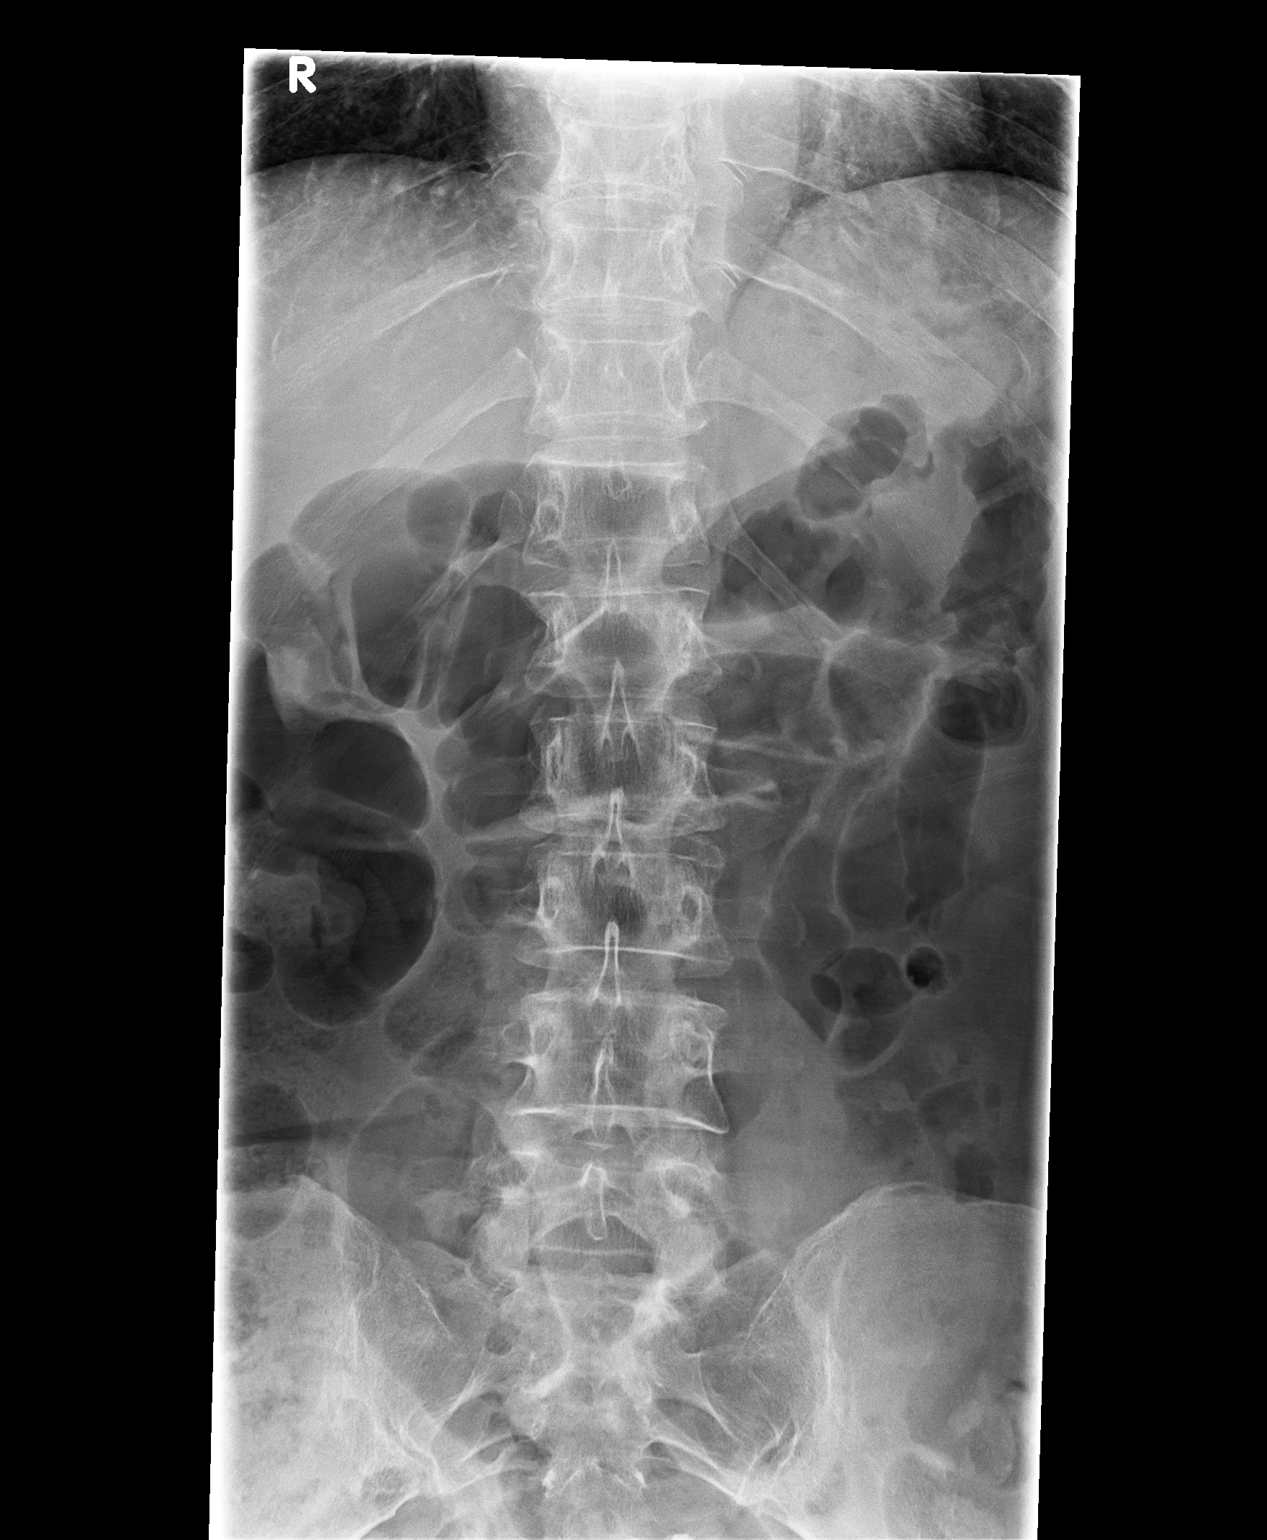

[l-spine lat]
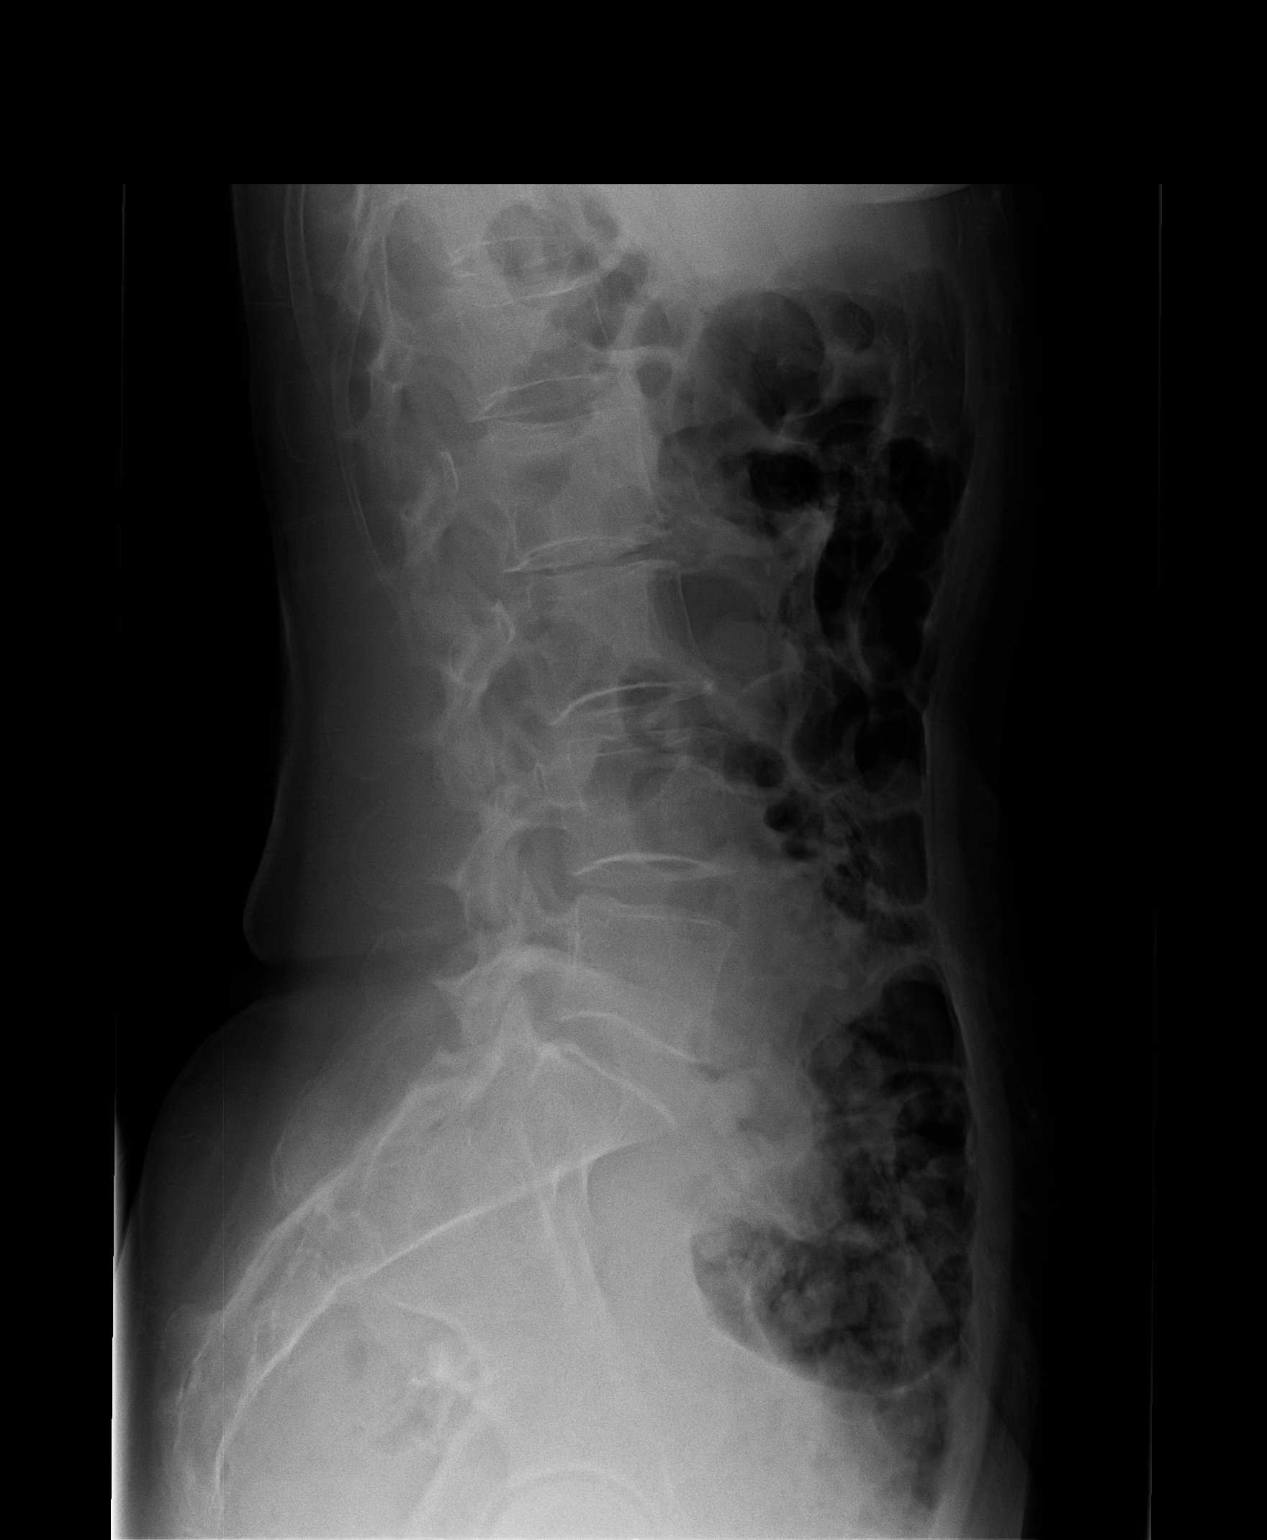

[l-spine l5-s1]
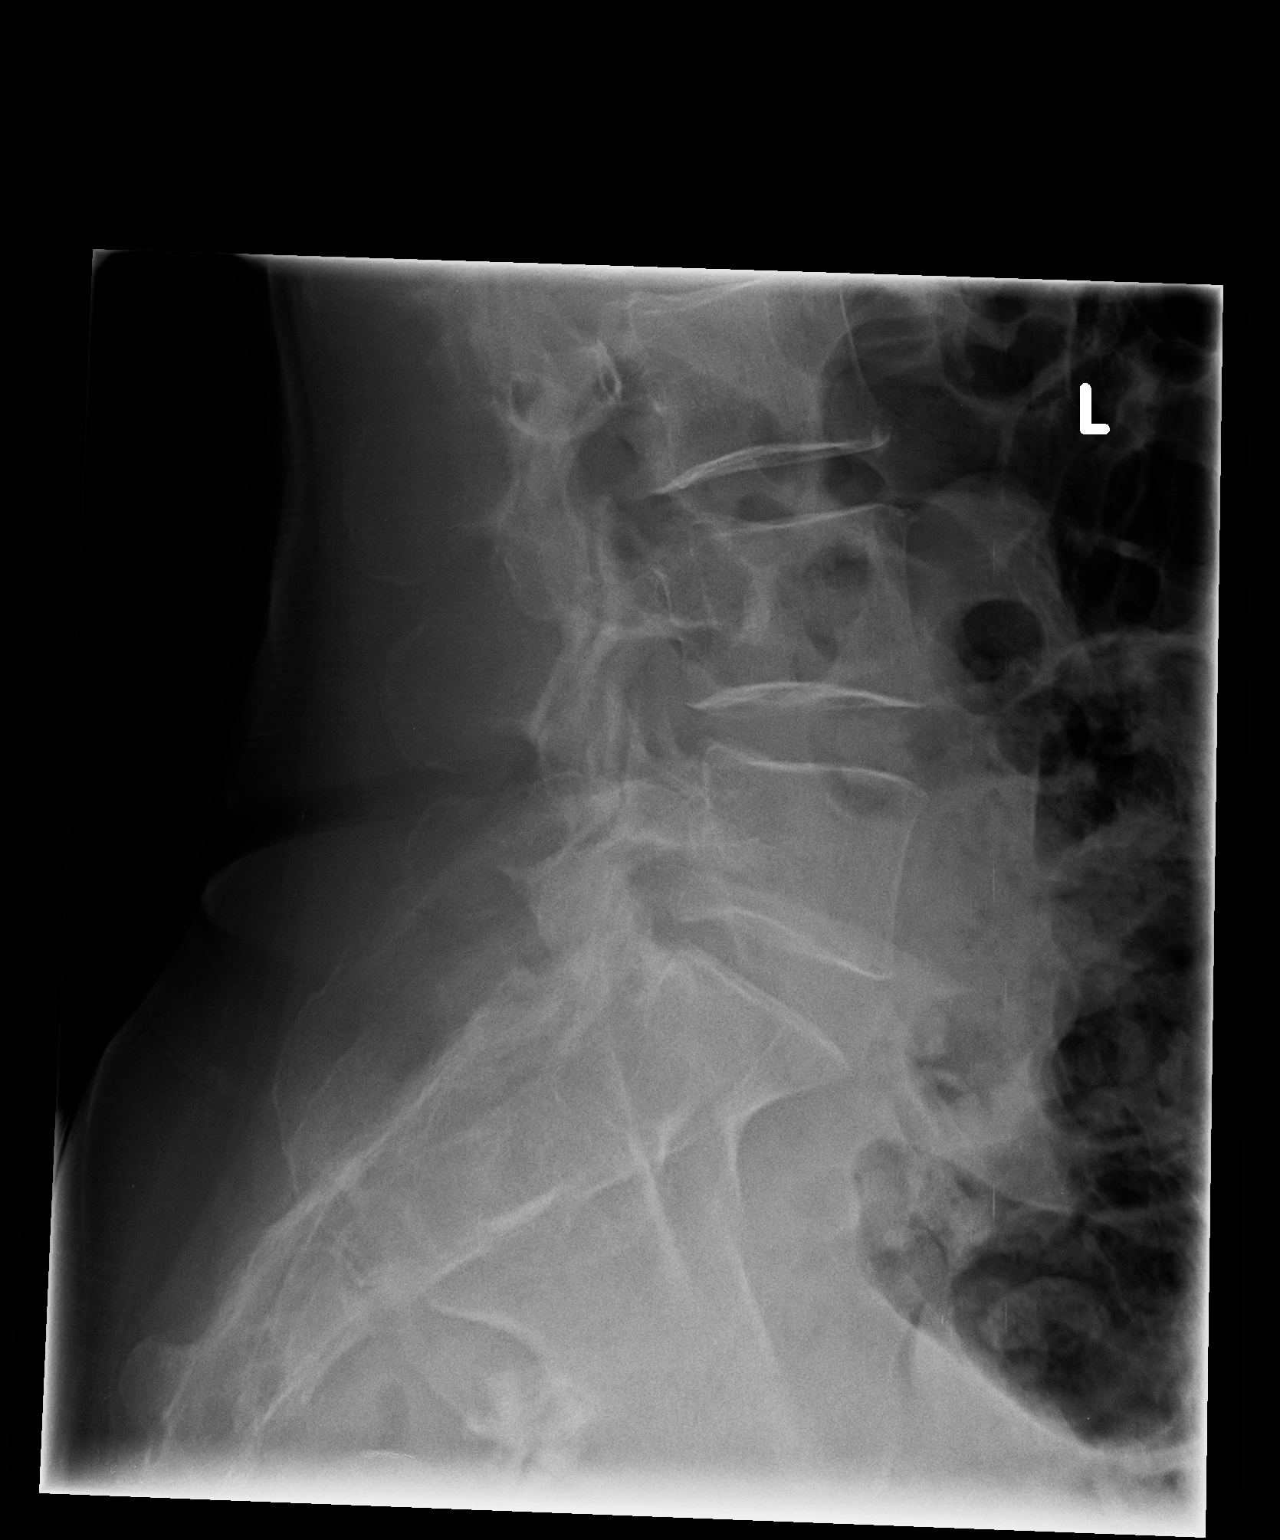

[3 of 3 positions shown; findings below may reference images not displayed]

EXAM
Lumbar spine.

INDICATION
fall injury
FALL. POSTERIOR RIGHT HIP PAIN. HB

FINDINGS
AP and lateral views of the lumbar spine were obtained.
There is no fracture or alignment abnormality of the lumbar vertebrae. The disc space heights are
well maintained.

IMPRESSION
There is no significant radiographic abnormality of the lumbar spine.

Tech Notes:

FALL. POSTERIOR RIGHT HIP PAIN. HB

## 2018-02-28 IMAGING — CR PELVIS
3 series · 3 of 3 positions shown · non-contrast
Comparison: none

[pelvis]
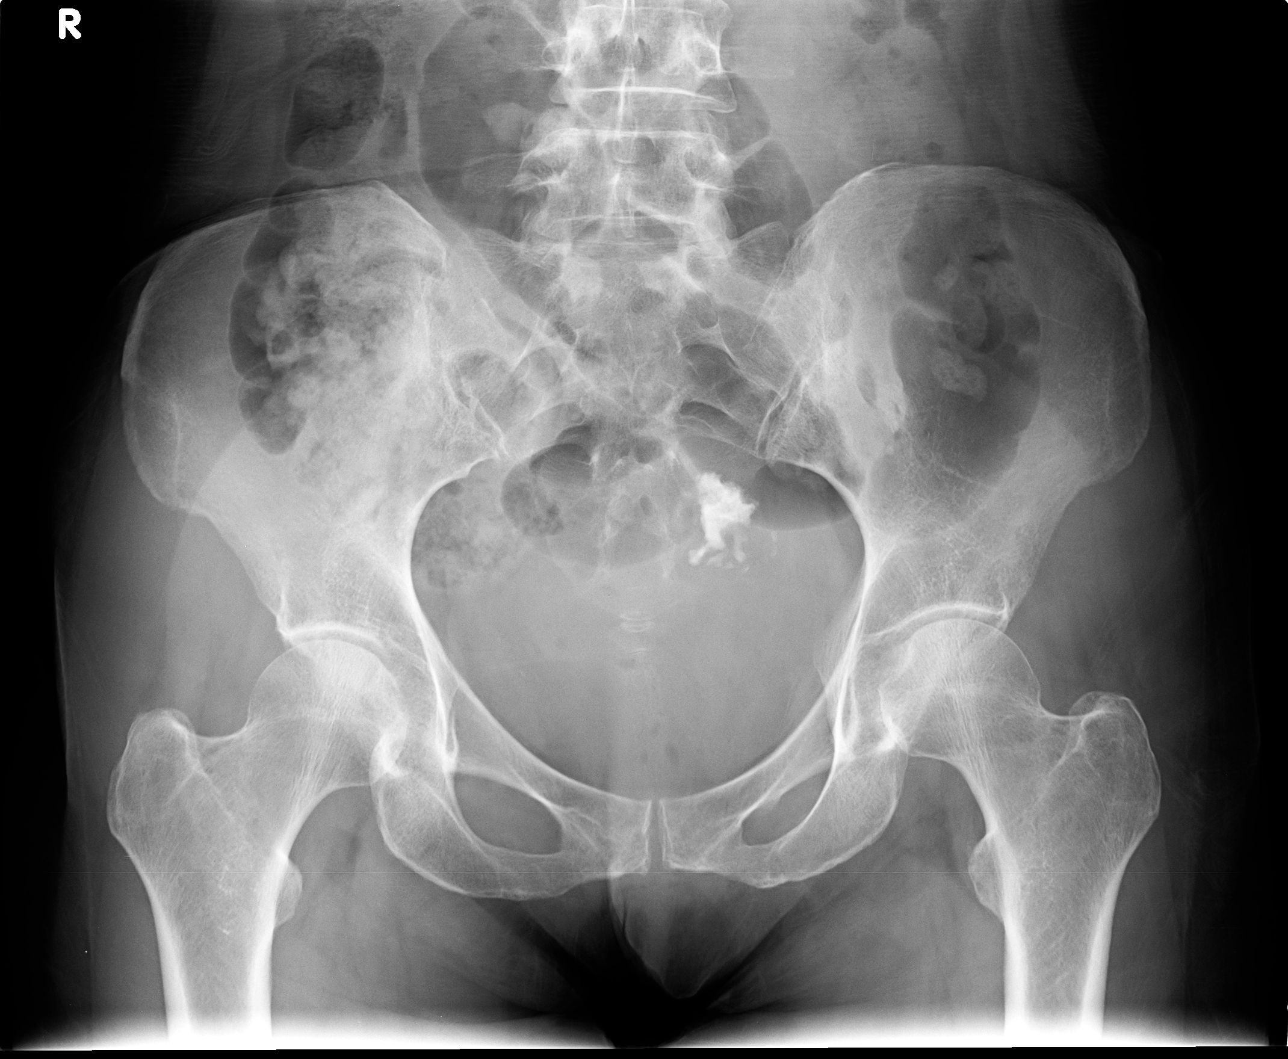

[hip ap]
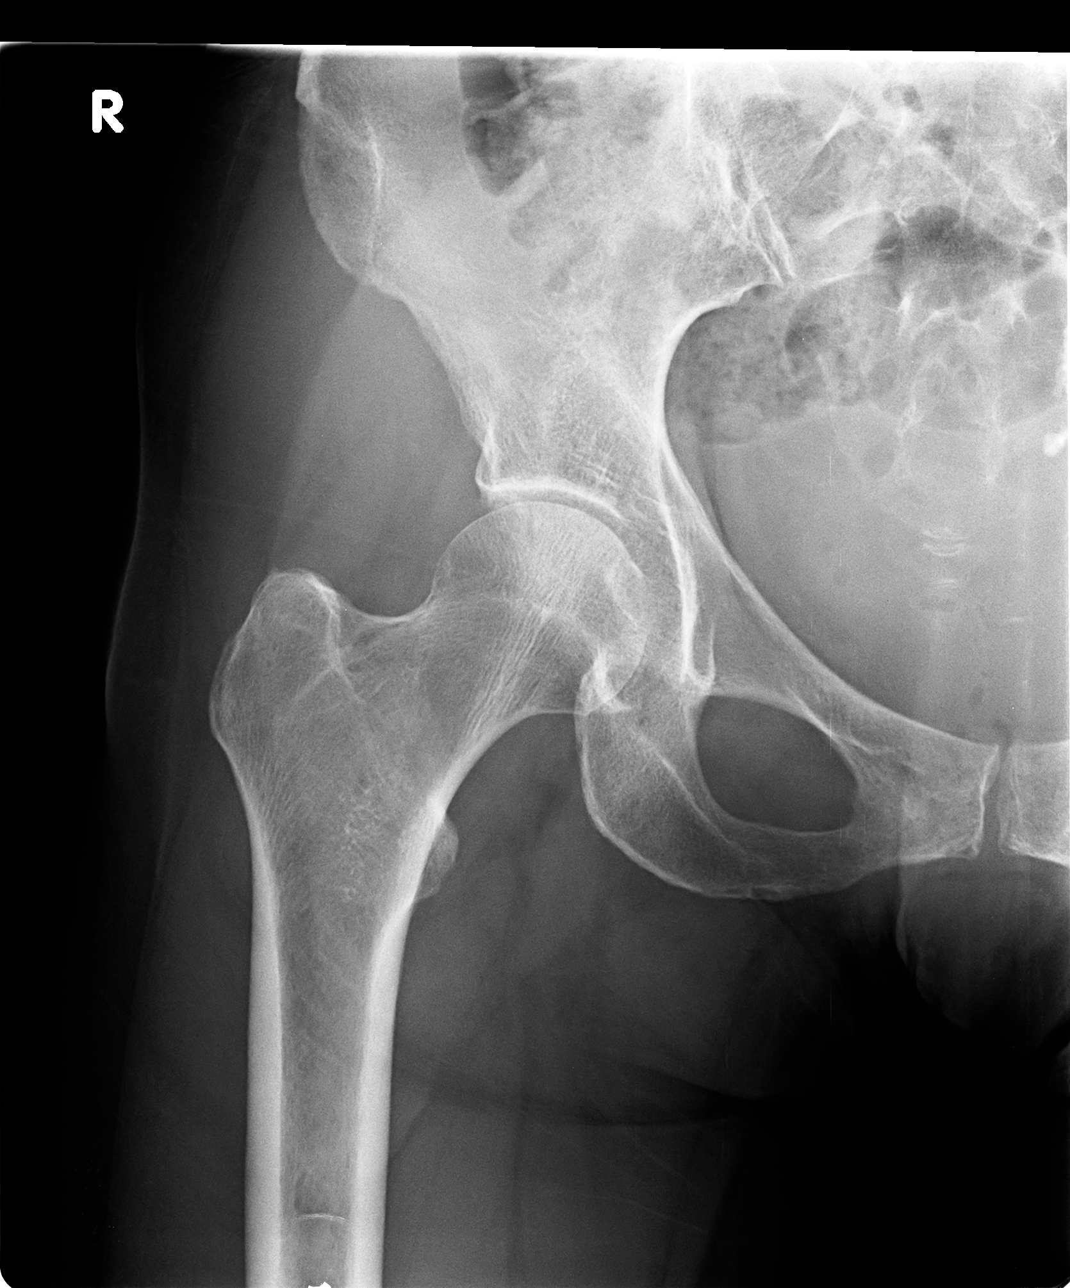

[hip frog]
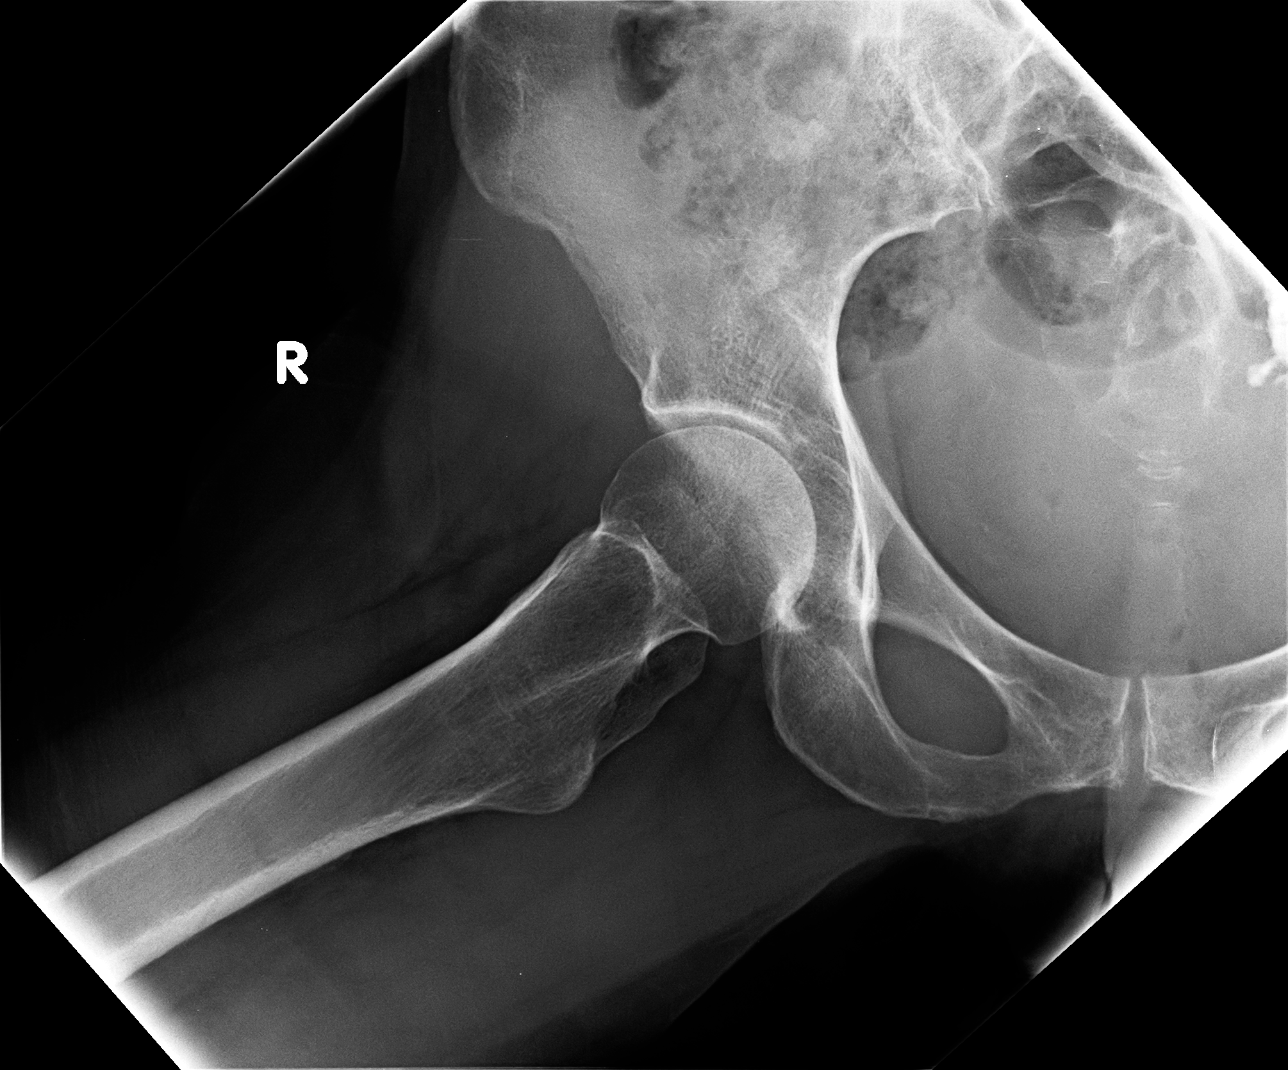

[3 of 3 positions shown; findings below may reference images not displayed]

EXAM
Right hip with pelvis.

INDICATION
fall injury
FALL. POSTERIOR RIGHT HIP PAIN. HB

FINDINGS
Two views of the right hip and a single view of the pelvis were obtained.
There is no evidence of fracture or dislocation of the right hip.
There is no acute bone abnormality of the pelvis.
There is a mortise calcification within the left side of the pelvis per.

IMPRESSION
No fracture or acute bone abnormality right hip or pelvis is identified.
There is a more physician calcification within the pelvis on the left. This could be due to an
atypical calcified uterine fibroid. A dermoid tumor could give this appearance.

Tech Notes:

FALL. POSTERIOR RIGHT HIP PAIN. HB

## 2018-03-01 ENCOUNTER — Encounter: Admit: 2018-03-01 | Discharge: 2018-03-01 | Payer: MEDICARE

## 2018-03-06 ENCOUNTER — Encounter: Admit: 2018-03-06 | Discharge: 2018-03-06 | Payer: MEDICARE

## 2018-03-06 ENCOUNTER — Ambulatory Visit: Admit: 2018-03-06 | Discharge: 2018-03-07 | Payer: MEDICARE

## 2018-03-06 DIAGNOSIS — R51 Headache: Secondary | ICD-10-CM

## 2018-03-06 MED ORDER — IOPAMIDOL 41 % IT SOLN
2.5 mL | Freq: Once | EPIDURAL | 0 refills | Status: CP
Start: 2018-03-06 — End: ?
  Administered 2018-03-06: 22:00:00 2.5 mL via EPIDURAL

## 2018-03-06 MED ORDER — TRIAMCINOLONE ACETONIDE 40 MG/ML IJ SUSP
80 mg | Freq: Once | EPIDURAL | 0 refills | Status: CP
Start: 2018-03-06 — End: ?
  Administered 2018-03-06: 22:00:00 80 mg via EPIDURAL

## 2018-03-07 ENCOUNTER — Encounter: Admit: 2018-03-07 | Discharge: 2018-03-07 | Payer: MEDICARE

## 2018-03-07 DIAGNOSIS — Z882 Allergy status to sulfonamides status: Secondary | ICD-10-CM

## 2018-03-07 DIAGNOSIS — M5412 Radiculopathy, cervical region: Secondary | ICD-10-CM

## 2018-03-07 DIAGNOSIS — M7918 Myalgia, other site: Secondary | ICD-10-CM

## 2018-03-07 DIAGNOSIS — R51 Headache: Secondary | ICD-10-CM

## 2018-03-08 ENCOUNTER — Ambulatory Visit: Admit: 2018-03-07 | Discharge: 2018-03-08 | Payer: MEDICARE

## 2018-03-08 DIAGNOSIS — R6889 Other general symptoms and signs: Secondary | ICD-10-CM

## 2018-03-13 IMAGING — CR CHEST
3 series · 3 of 3 positions shown · non-contrast
Comparison: none

[shoulder external]
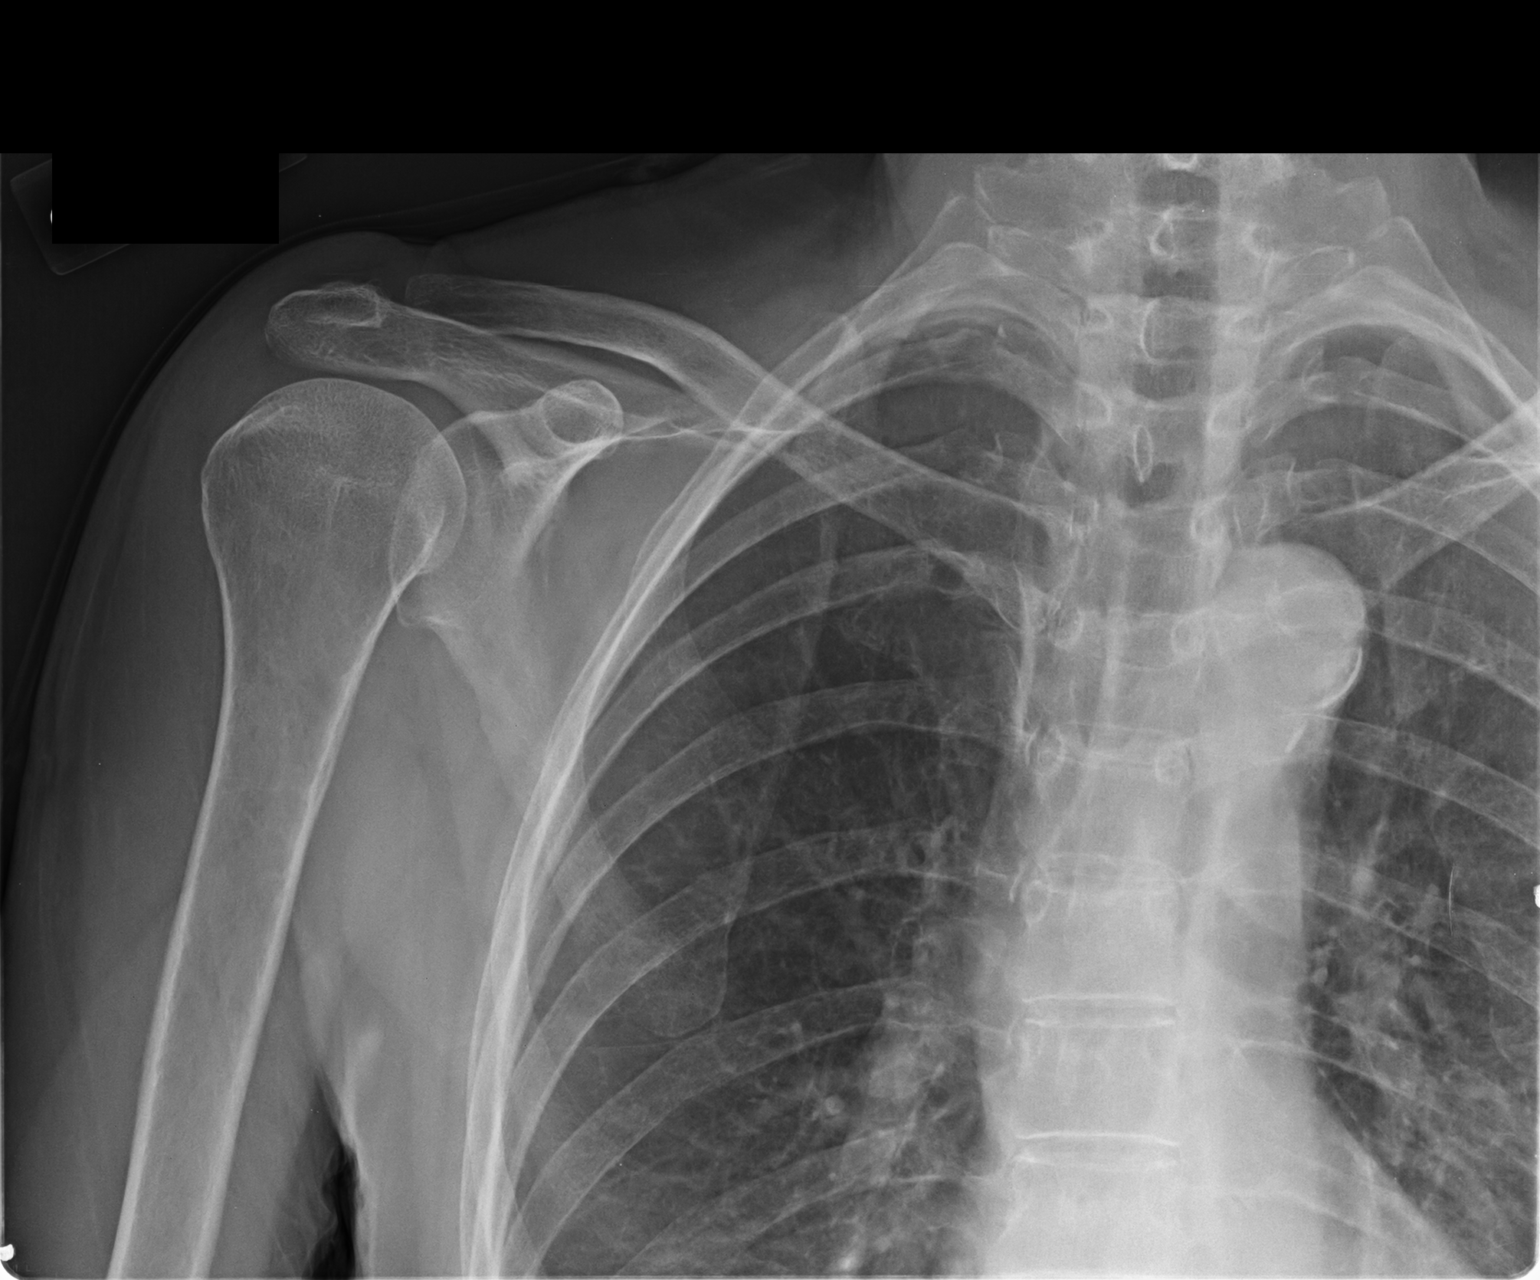

[shoulder internal]
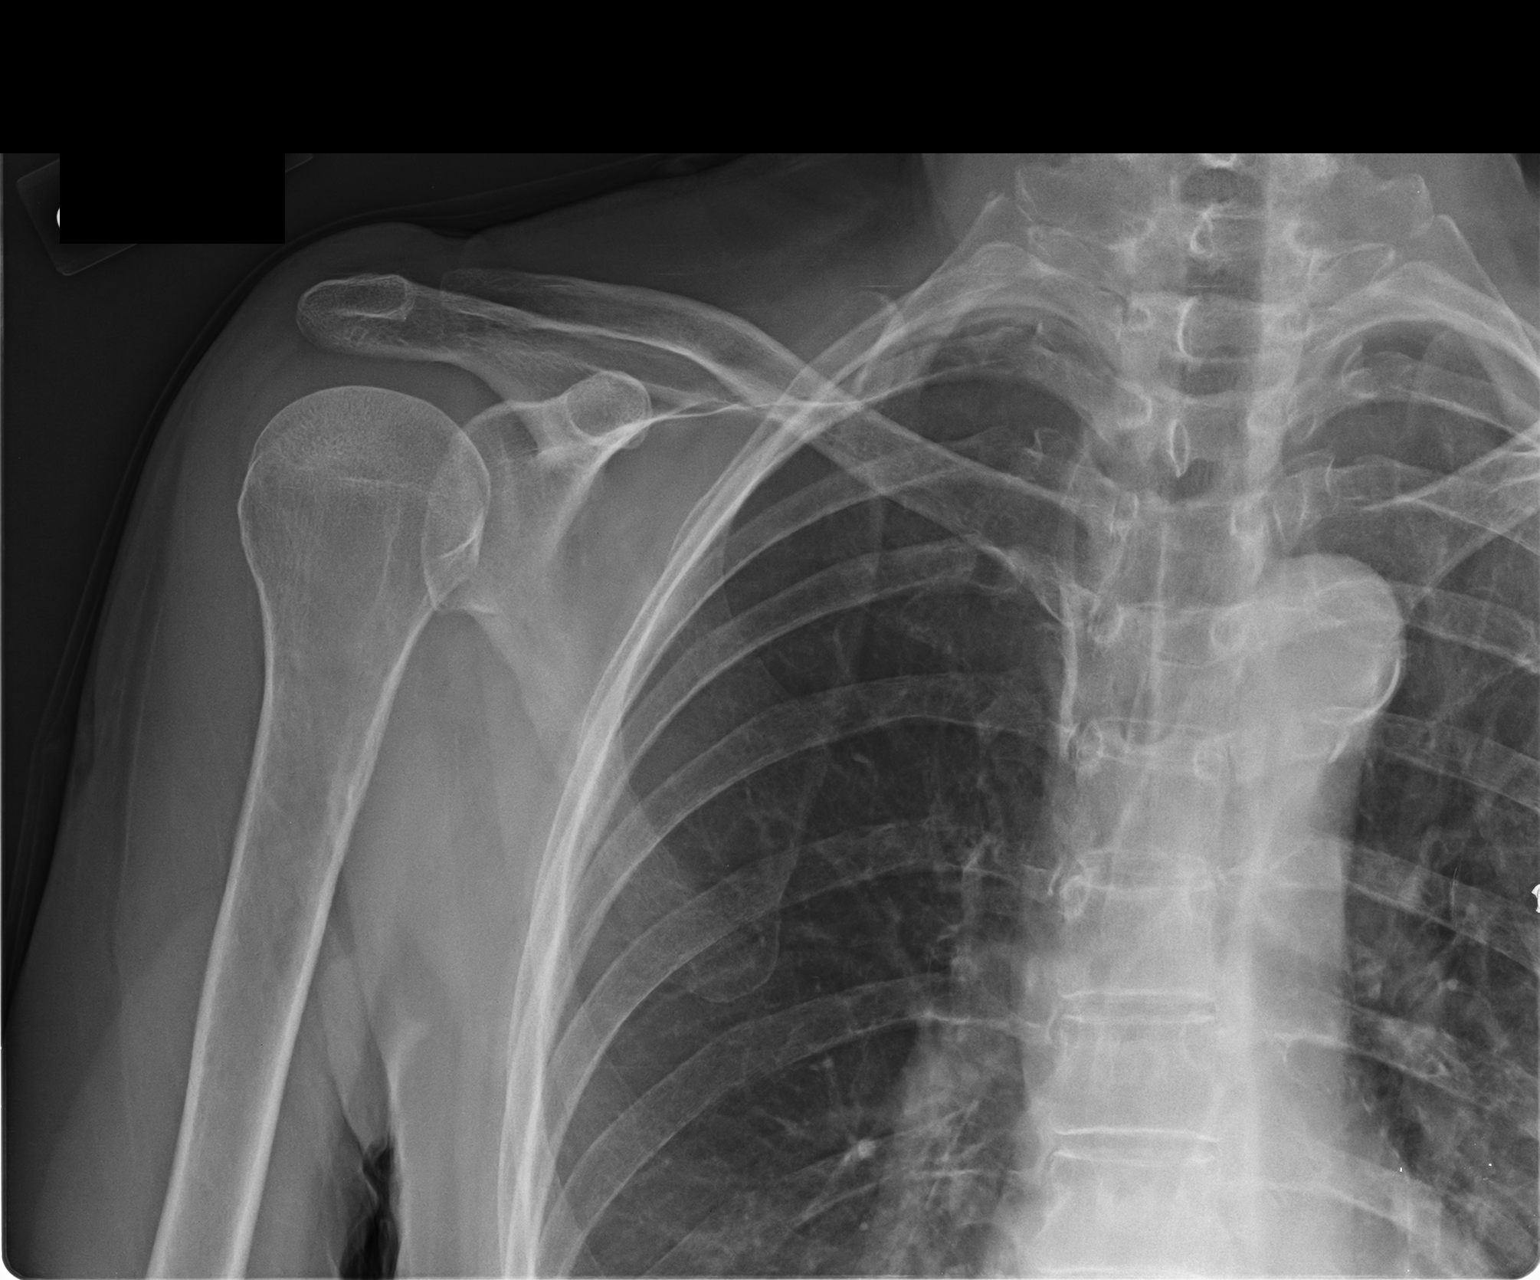

[shoulder y-view]
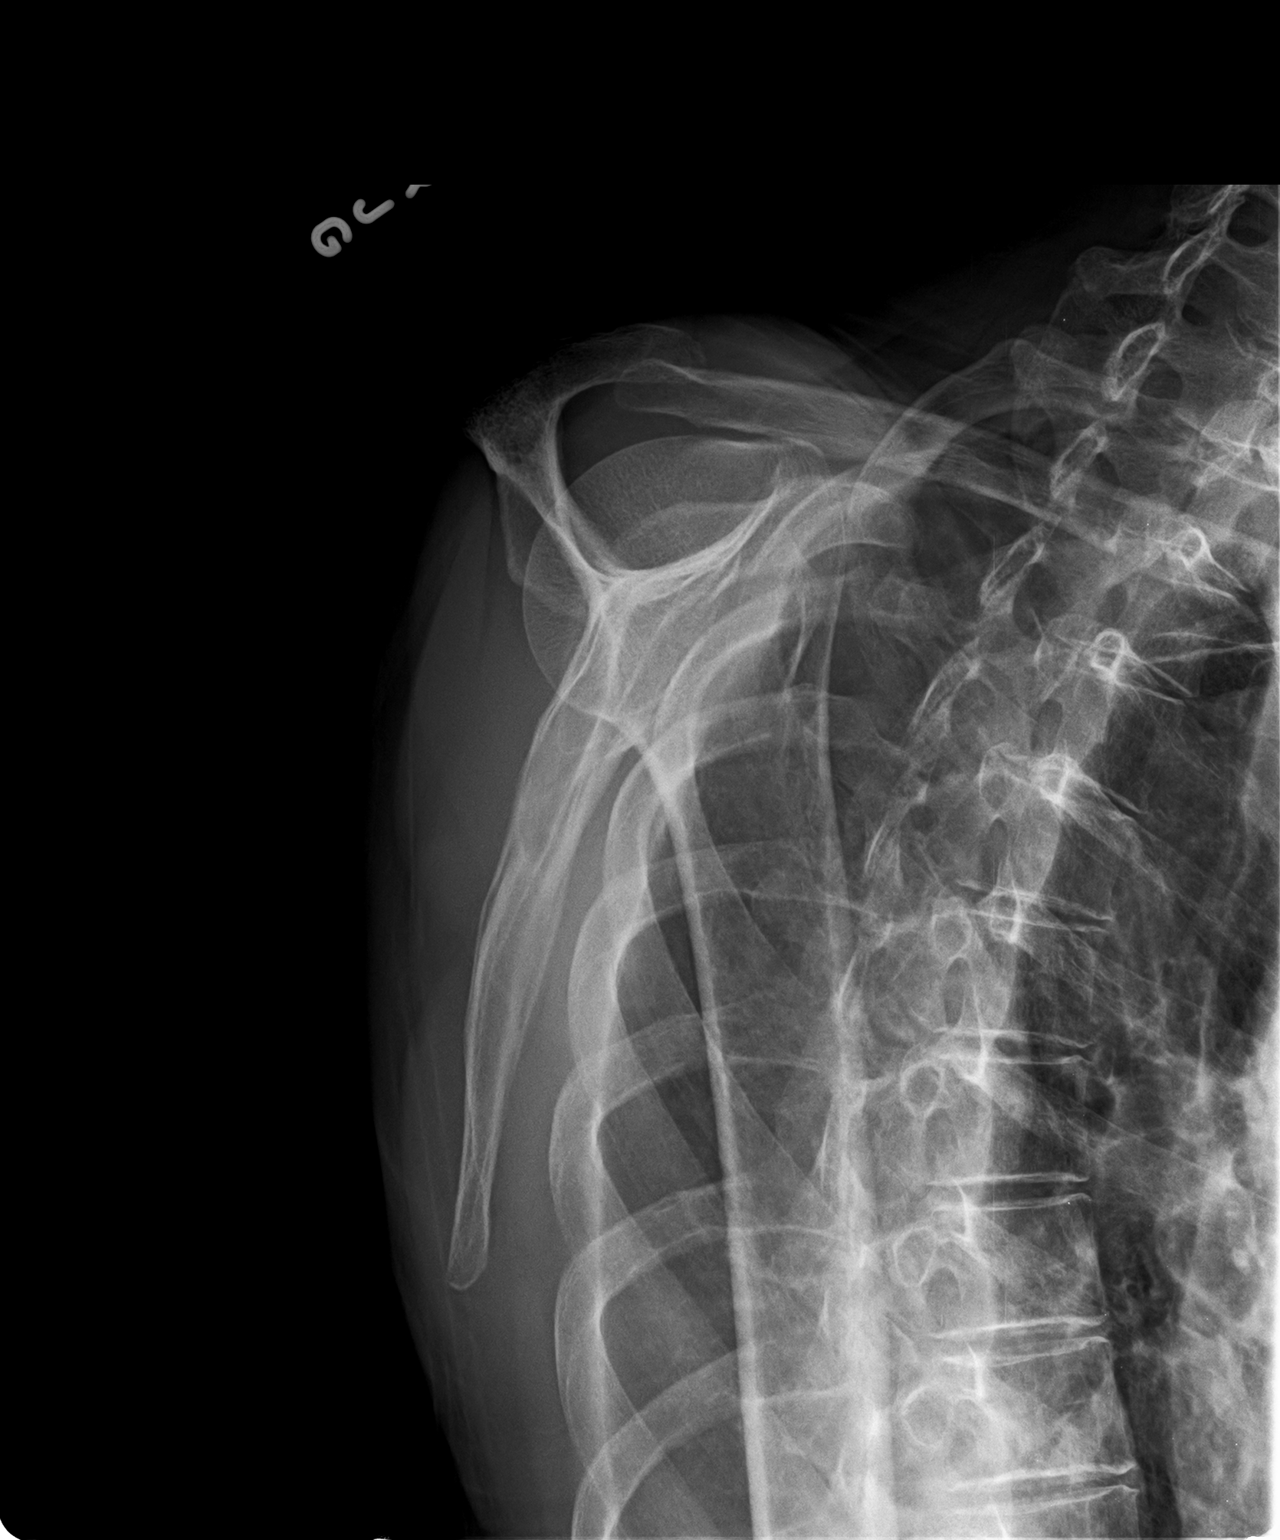

[3 of 3 positions shown; findings below may reference images not displayed]

DIAGNOSTIC STUDIES

EXAM
Right shoulder

INDICATION
fell on ice
FALL ON ICE, PAIN TO ANTERIOR SHOULDER.

TECHNIQUE
Internally and externally rotated views and lateral views were obtained.

COMPARISONS
None available

FINDINGS
No fractures or dislocations are seen. Joint spaces are well maintained.

IMPRESSION
Negative right shoulder.

Tech Notes:

FALL ON ICE, PAIN TO ANTERIOR SHOULDER.

## 2018-03-15 ENCOUNTER — Ambulatory Visit: Admit: 2018-03-14 | Discharge: 2018-03-15 | Payer: MEDICARE

## 2018-03-15 ENCOUNTER — Encounter: Admit: 2018-03-15 | Discharge: 2018-03-15 | Payer: MEDICARE

## 2018-03-15 DIAGNOSIS — R6889 Other general symptoms and signs: Secondary | ICD-10-CM

## 2018-03-21 ENCOUNTER — Encounter: Admit: 2018-03-21 | Discharge: 2018-03-21 | Payer: MEDICARE

## 2018-03-21 ENCOUNTER — Ambulatory Visit: Admit: 2018-03-21 | Discharge: 2018-03-22 | Payer: MEDICARE

## 2018-03-21 DIAGNOSIS — R6889 Other general symptoms and signs: Secondary | ICD-10-CM

## 2018-03-21 IMAGING — US ECHOCOMPL
1 series · 14 of 24 positions shown · non-contrast
Comparison: none

[Series 1: us echo 2d, wo/w m-mode, compl · 80 acquisitions, 14 frames shown]
[im 1/80]
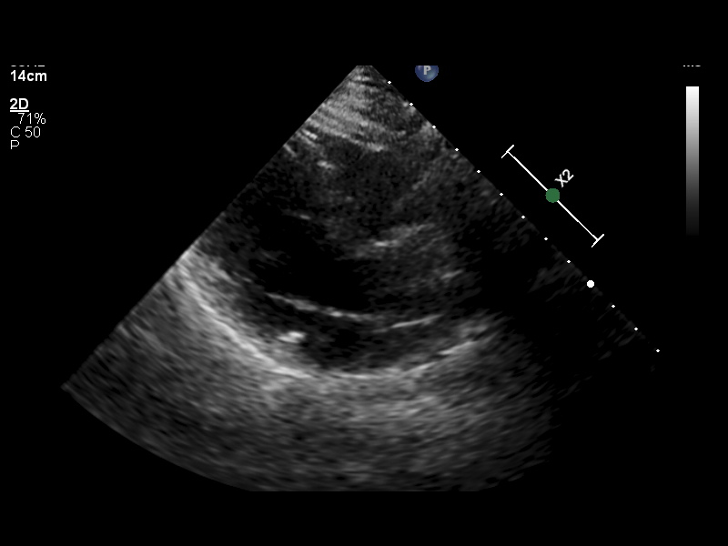
[im 7/80]
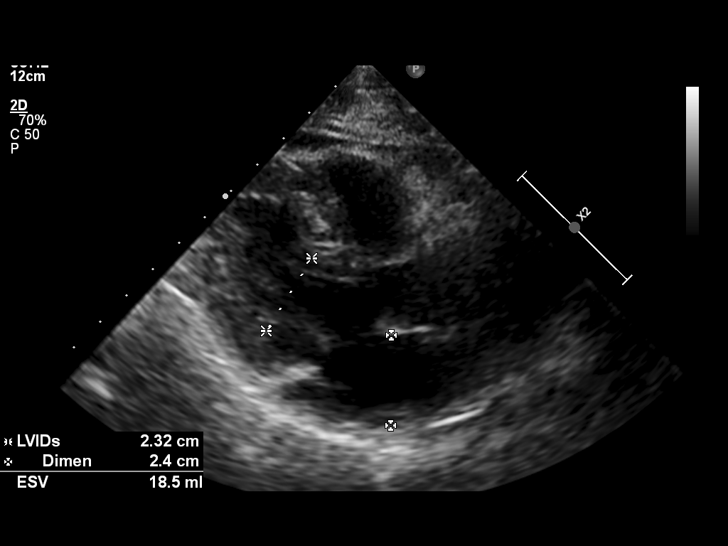
[im 14/80]
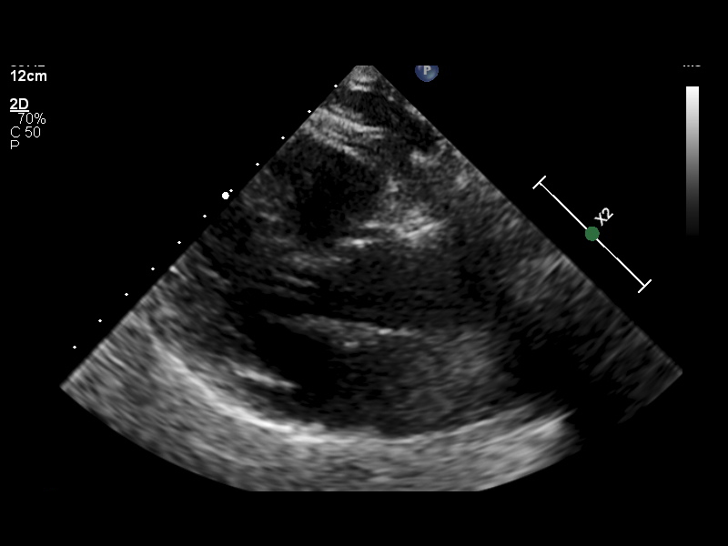
[im 21/80]
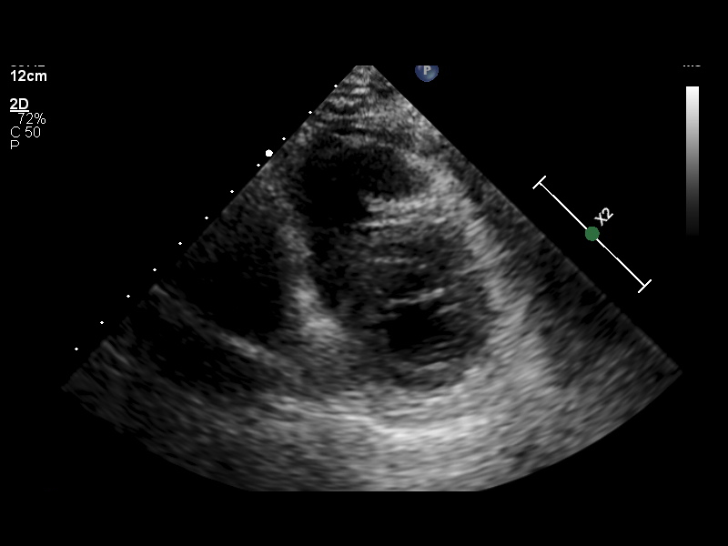
[im 25/80]
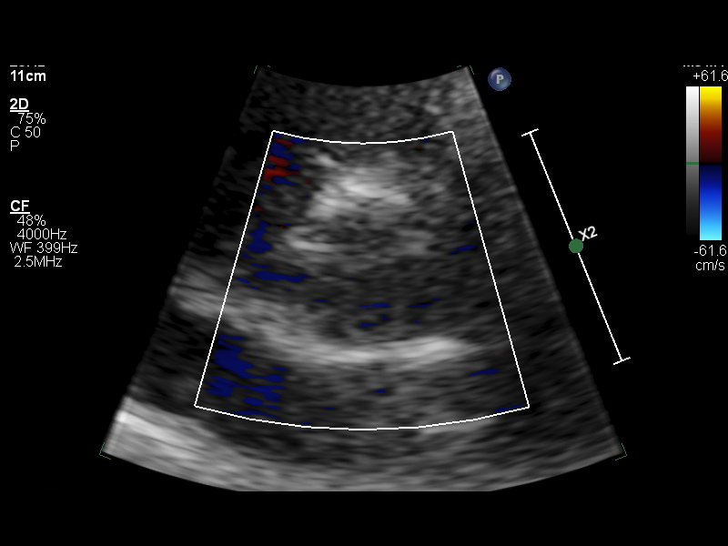
[im 31/80]
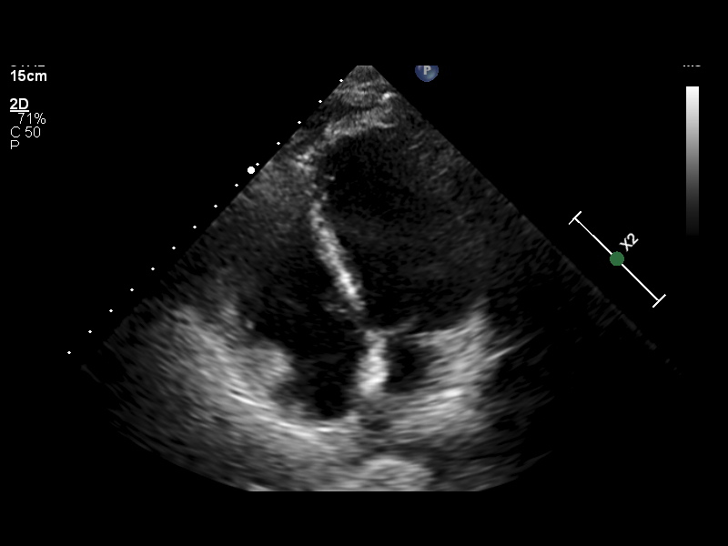
[im 38/80]
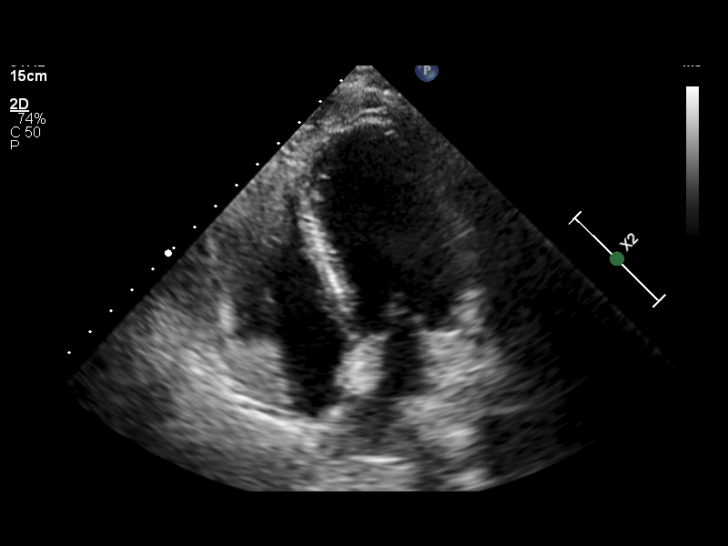
[im 38/80]
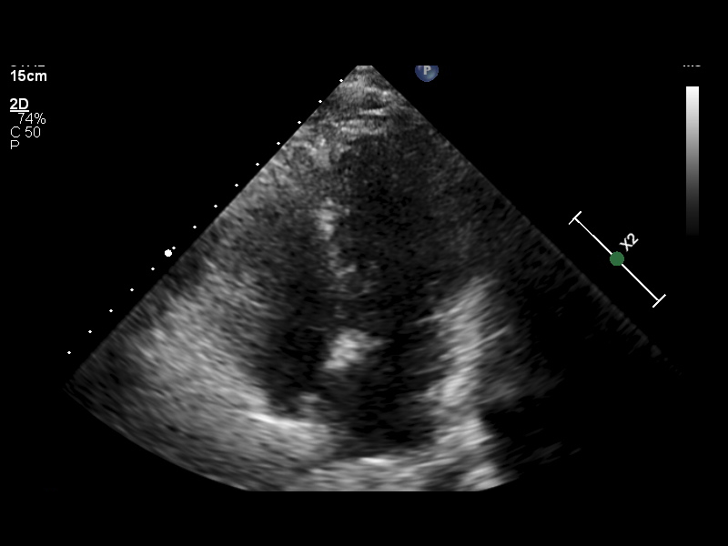
[im 45/80]
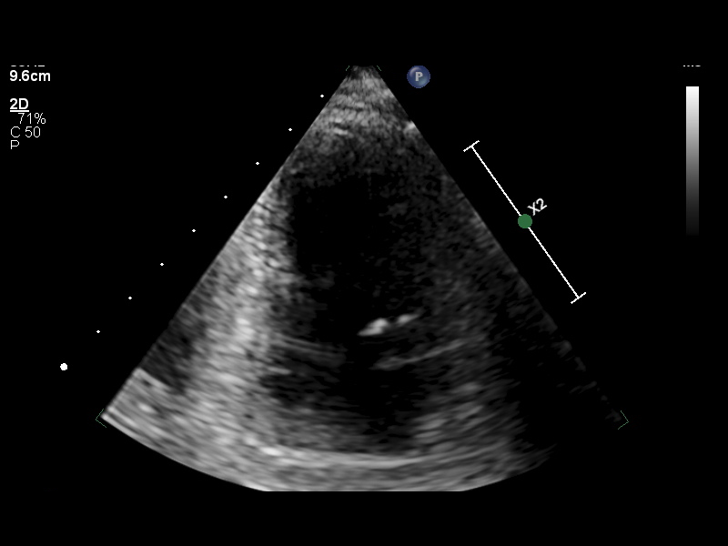
[im 55/80]
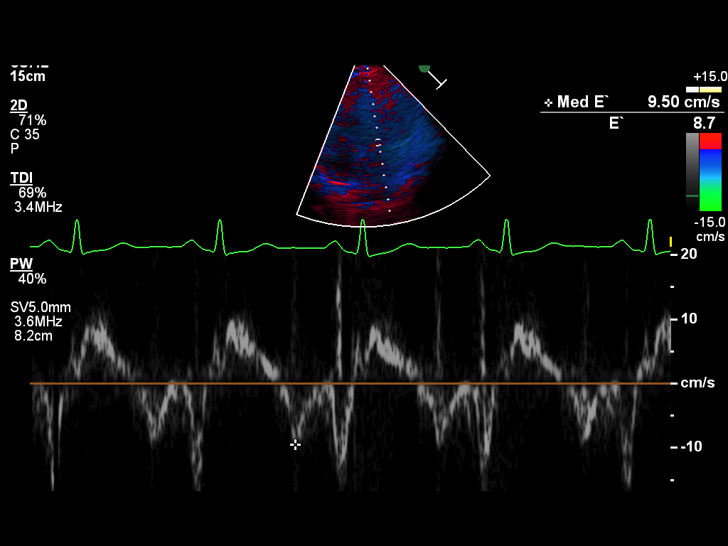
[im 62/80]
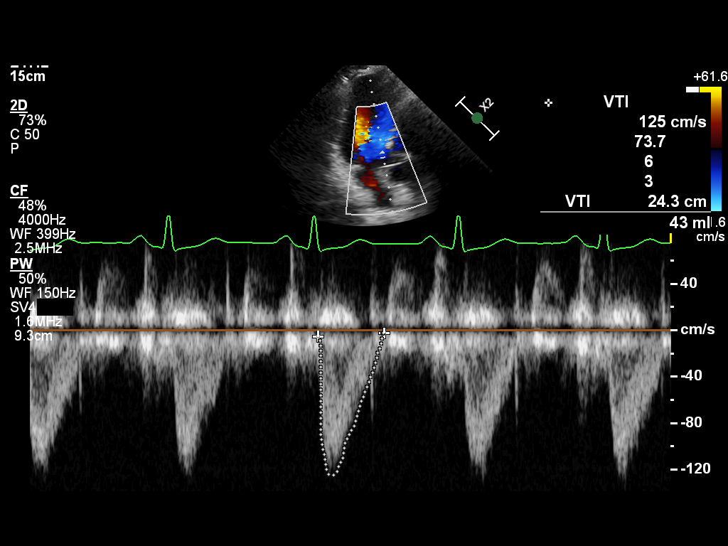
[im 69/80]
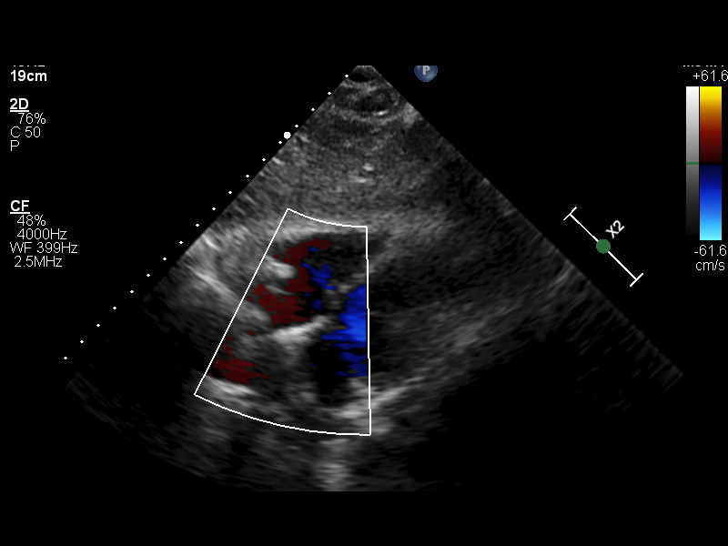
[im 73/80]
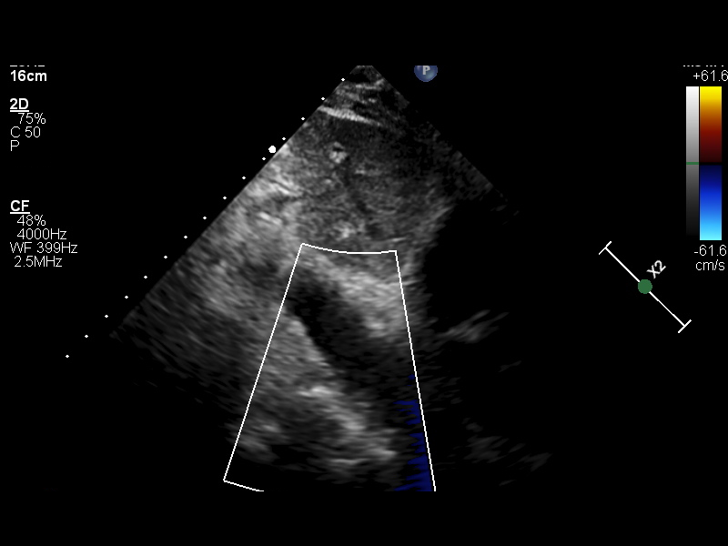
[im 80/80]
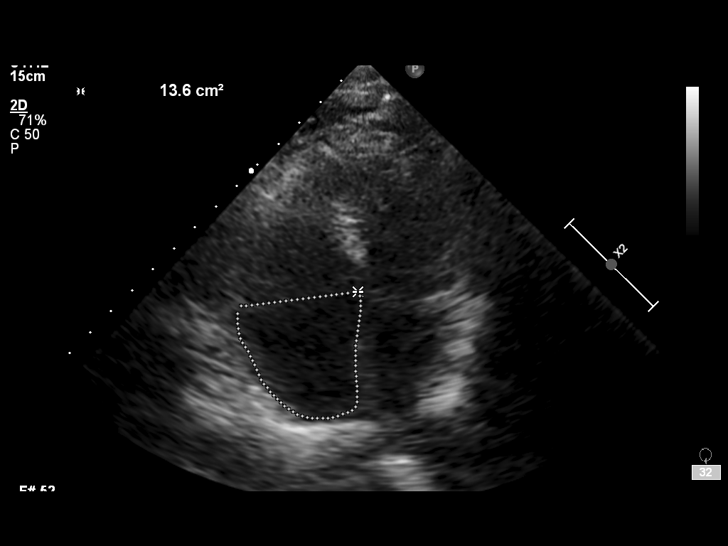

[14 of 24 positions shown; findings below may reference images not displayed]

FINAL REPORT IS SCANNED IN THE PATIENT'S EMR.

Tech Notes:

jl/tm

## 2018-03-24 ENCOUNTER — Encounter: Admit: 2018-03-24 | Discharge: 2018-03-24 | Payer: MEDICARE

## 2018-05-22 ENCOUNTER — Encounter: Admit: 2018-05-22 | Discharge: 2018-05-22 | Payer: MEDICARE

## 2018-05-22 DIAGNOSIS — M5412 Radiculopathy, cervical region: Principal | ICD-10-CM

## 2018-05-25 ENCOUNTER — Encounter: Admit: 2018-05-25 | Discharge: 2018-05-25 | Payer: MEDICARE

## 2018-07-04 ENCOUNTER — Encounter: Admit: 2018-07-04 | Discharge: 2018-07-04 | Payer: MEDICARE

## 2018-07-05 ENCOUNTER — Encounter: Admit: 2018-07-05 | Discharge: 2018-07-05 | Payer: MEDICARE

## 2018-07-05 ENCOUNTER — Ambulatory Visit: Admit: 2018-07-05 | Discharge: 2018-07-05 | Payer: MEDICARE

## 2018-07-05 ENCOUNTER — Ambulatory Visit: Admit: 2018-07-05 | Discharge: 2018-07-06 | Payer: MEDICARE

## 2018-07-05 DIAGNOSIS — R52 Pain, unspecified: ICD-10-CM

## 2018-07-05 DIAGNOSIS — M5030 Other cervical disc degeneration, unspecified cervical region: ICD-10-CM

## 2018-07-05 DIAGNOSIS — M5412 Radiculopathy, cervical region: Principal | ICD-10-CM

## 2018-07-05 DIAGNOSIS — R51 Headache: Principal | ICD-10-CM

## 2018-07-05 DIAGNOSIS — M7918 Myalgia, other site: ICD-10-CM

## 2018-07-05 MED ORDER — LIDOCAINE (PF) 10 MG/ML (1 %) IJ SOLN
2 mL | Freq: Once | INTRAMUSCULAR | 0 refills | Status: CP
Start: 2018-07-05 — End: ?

## 2018-07-05 MED ORDER — IOHEXOL 300 MG IODINE/ML IV SOLN
2 mL | Freq: Once | 0 refills | Status: CP
Start: 2018-07-05 — End: ?

## 2018-07-05 MED ORDER — TRIAMCINOLONE ACETONIDE 40 MG/ML IJ SUSP
80 mg | Freq: Once | EPIDURAL | 0 refills | Status: CP
Start: 2018-07-05 — End: ?

## 2018-07-05 MED ORDER — LIDOCAINE (PF) 10 MG/ML (1 %) IJ SOLN
5 mL | Freq: Once | INTRAMUSCULAR | 0 refills | Status: CP | PRN
Start: 2018-07-05 — End: ?

## 2018-07-05 NOTE — Progress Notes
PLAN: Cervical Epidural Steroid Injection C7-T1 and TPI neck

## 2018-07-05 NOTE — Procedures
Attending Surgeon: Evelina Bucy, MD    Anesthesia: Local    Pre-Procedure Diagnosis:   1. Cervical radiculopathy    2. DDD (degenerative disc disease), cervical        Post-Procedure Diagnosis:   1. Cervical radiculopathy    2. DDD (degenerative disc disease), cervical        Epidural Steroid Injection Cervical/Thoracic  Procedure: epidural - interlaminar    Laterality: n/a    Location: cervical ESI with imaging - C7-T1      Consent:   Consent obtained: written  Consent given by: patient  Risks discussed: allergic reaction, bleeding, bruising, infection, never damage, no change or worsening in pain, pneumo thorax, reaction to medication, seizure, swelling and weakness  Alternatives discussed: alternative treatment, delayed treatment and no treatment  Discussed with patient the purpose of the treatment/procedure, other ways of treating my condition, including no treatment/ procedure and the risks and benefits of the alternatives. Patient has decided to proceed with treatment/procedure.        Universal Protocol:  Relevant documents: relevant documents present and verified  Site marked: the operative site was marked  Patient identity confirmed: Patient identify confirmed verbally with patient.        Time out: Immediately prior to procedure a time out was called to verify the correct patient, procedure, equipment, support staff and site/side marked as required      Procedures Details:   Indications: pain   Prep: chlorhexidine  Patient position: prone  Estimated Blood Loss: minimal  Specimens: none  Amount Injected:   C7-T1: 4mL    Number of Levels: 1  Approach: midline  Guidance: fluoroscopy  Contrast: Procedure confirmed with contrast under live fluoroscopy.  Needle and Epidural Catheter: tuohy  Needle size: 18 G  Injection procedure: Incremental injection and Negative aspiration for blood  Patient tolerance: Patient tolerated the procedure well with no immediate Consent obtained: written  Consent given by: patient  Alternatives discussed: alternative treatment, delayed treatment and no treatment  Discussed with patient the purpose of the treatment/procedure, other ways of treating my condition, including no treatment/ procedure and the risks and benefits of the alternatives. Patient has decided to proceed with treatment/procedure.        Universal Protocol:  Relevant documents: relevant documents present and verified  Site marked: the operative site was marked  Patient identity confirmed: Patient identify confirmed verbally with patient.        Time out: Immediately prior to procedure a time out was called to verify the correct patient, procedure, equipment, support staff and site/side marked as required      Procedures Details:   Indications: muscle spasm, myalgia and painPrep: alcohol  Needle size: 27 G  Number of muscles: 3 or more  Approach: posterior  Medications administered: 5 mL lidocaine PF 1% (10 mg/mL)  Patient tolerance: Patient tolerated the procedure well with no immediate complications. Pressure was applied, and hemostasis was accomplished.  Comments:   PROCEDURE: Trigger Point Injections    Pre-Procedure Diagnoses:  1. Myalgia, other  2. Myofascial pain  3. Muscle spasm    Post-Procedure Diagnoses:  Same    Physician: Evelina Bucy, MD    ANESTHESIA: Lidocaine 1%    COMPLICATIONS: None.    Location(s) of pain: As noted above  Presence of point tenderness in a tight band of muscle in above area: Yes  Restricted range of motion: Yes  Conservative therapy attempted for at least 6 weeks: activity modification, pharmacotherapy  Other components of comprehensive management:  pharmacotherapy, activity modification    DESCRIPTION OF PROCEDURE: The procedure risks, hazards and alternatives were discussed with the patient and a proper consent was obtained. The area over each trigger point was prepped with alcohol utilizing sterile technique. After isolating it between two palpating fingertips a 27-gauge 1.5 needle was placed in the center of the myofascial spasms and a negative aspiration was performed. Then 0.5cc of the solution above was injected into each trigger point.     Trigger points in each of the muscle groups noted above were injected. A total of 5 ml of the local anesthetic solution was utilized.    The patient tolerated the procedure well without any apparent difficulties or complications.          Estimated blood loss: none or minimal  Specimens: none  Patient tolerated the procedure well with no immediate complications. Pressure was applied, and hemostasis was accomplished.

## 2018-07-05 NOTE — Discharge Instructions - Supplementary Instructions
4. Avoid strenuous activity today. You many resume your regular activities and exercise tomorrow.  5. Patients with diabetes may see an elevation in blood sugars for 7-10 days after the injection. It is important to pay close attention to               your diet, check your blood sugars daily and repost extreme elevations to the physician that treats your diabetes.  6. Patients taking daily blood thinners can resume their regular dose this evening.  7. It is important that you take all medications ordered by your pain physician. Taking medications as ordered is an important part of you           pain care plan. If you cannot continue the medication plan, please notify the physician.    Possible side effects to steroids that may occur:  ? Flushing or redness of the face  ? Irritability  ? Fluid retention  ? Change in women's menses      Follow up appointment as needed if in the event you are unable to keep an appointment please notify the scheduler 24 hours in advance at [phone]. If you have questions for the surgery center, call [facility] at [phone].

## 2018-07-11 ENCOUNTER — Encounter: Admit: 2018-07-11 | Discharge: 2018-07-11 | Payer: MEDICARE

## 2018-07-11 ENCOUNTER — Ambulatory Visit: Admit: 2018-07-11 | Discharge: 2018-07-12 | Payer: MEDICARE

## 2018-07-11 DIAGNOSIS — R51 Headache: Principal | ICD-10-CM

## 2018-07-11 NOTE — Progress Notes
Obtained patient's verbal consent to treat them and their agreement to [facility] financial policy and NPP via this telehealth visit during the [facility]    The following visit was completed via Zoom (Audio/Video).    Rita Gibson is a 68 y.o. female.    CC: follow up       History of Present Illness    She presents for follow up. Headaches are currently under very good control. They had worsened slightly in April which she attributes to missing one of her pain injections the month prior due to COVID. She just recently had repeat injection in the past week and this has helped greatly.     Pain can occur anywhere, is often severe, but does not always last very long. Other times may last much of the day, though this tends to be less often.          Medical History:   Diagnosis Date   ??? Generalized headaches      Surgical History:   Procedure Laterality Date   ??? ANKLE SURGERY     ??? HEART CATHETERIZATION     ??? TUBAL LIGATION       Social History     Socioeconomic History   ??? Marital status: Married     Spouse name: Not on file   ??? Number of children: Not on file   ??? Years of education: Not on file   ??? Highest education level: Some college, no degree   Occupational History   ??? Not on file   Tobacco Use   ??? Smoking status: Never Smoker   ??? Smokeless tobacco: Never Used   Substance and Sexual Activity   ??? Alcohol use: Not Currently   ??? Drug use: Never   ??? Sexual activity: Not on file   Other Topics Concern   ??? Not on file   Social History Narrative   ??? Not on file     Family History   Problem Relation Age of Onset   ??? Arthritis Mother    ??? Cancer Father        Review of Systems      Objective:         ??? baclofen (LIORESAL) 10 mg tablet Take 10 mg by mouth twice daily.   ??? buPROPion SR(+) (WELLBUTRIN-SR) 150 mg tablet Take 150 mg by mouth twice daily.   ??? clonazePAM (KLONOPIN) 1 mg tablet Take 1 mg by mouth twice daily.   ??? gabapentin (NEURONTIN) 300 mg capsule Take one capsule by mouth three

## 2018-07-12 DIAGNOSIS — G43709 Chronic migraine without aura, not intractable, without status migrainosus: Principal | ICD-10-CM

## 2018-07-18 ENCOUNTER — Encounter: Admit: 2018-07-18 | Discharge: 2018-07-18 | Payer: MEDICARE

## 2018-07-18 DIAGNOSIS — M7918 Myalgia, other site: Principal | ICD-10-CM

## 2018-07-19 ENCOUNTER — Encounter: Admit: 2018-07-19 | Discharge: 2018-07-19 | Payer: MEDICARE

## 2018-07-25 ENCOUNTER — Encounter: Admit: 2018-07-25 | Discharge: 2018-07-25

## 2018-07-25 ENCOUNTER — Ambulatory Visit: Admit: 2018-07-25 | Discharge: 2018-07-25

## 2018-07-25 DIAGNOSIS — R51 Headache: Secondary | ICD-10-CM

## 2018-07-25 DIAGNOSIS — G43709 Chronic migraine without aura, not intractable, without status migrainosus: Principal | ICD-10-CM

## 2018-07-25 NOTE — Progress Notes
Obtained patient's verbal consent to treat them and their agreement to [facility] financial policy and NPP via this telehealth visit during the [facility] Emergency    The following visit was completed via Zoom (Audio/Video).    Rita Gibson is a 68 y.o. female.    CC: follow up       History of Present Illness    She presents for follow up of new symptoms since her last appointment on 5/19. She believes that in retrospect she was in fact having headaches. Frequency is variable. This past week only had 3 days without headaches.     She presented to her eye doctor recently for double vision. 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 Double vision is variable. Believes it started about 1 month ago. Will have visual blurring and diagonal diplopia. Denies visual changes with position changes, has right sided tinnitus for years. No pulsatile tinnitus. Diplopia is transient, lasting only a few seconds, she is not sure if it remits when closing one eye.    She denies changes in speech or difficulties swallowing. Dr. Cheri Kearns is her ophthalmologist.         Medical History:   Diagnosis Date   ??? Generalized headaches      Surgical History:   Procedure Laterality Date   ??? ANKLE SURGERY     ??? HEART CATHETERIZATION     ??? TUBAL LIGATION       Social History     Socioeconomic History   ??? Marital status: Married     Spouse name: Not on file   ??? Number of children: Not on file   ??? Years of education: Not on file   ??? Highest education level: Some college, no degree   Occupational History   ??? Not on file   Tobacco Use   ??? Smoking status: Never Smoker   ??? Smokeless tobacco: Never Used   Substance and Sexual Activity   ??? Alcohol use: Not Currently   ??? Drug use: Never   ??? Sexual activity: Not on file   Other Topics Concern   ??? Not on file   Social History Narrative   ??? Not on file     Family History   Problem Relation Age of Onset   ??? Arthritis Mother    ??? Cancer Father Review of Systems      Objective:         ??? baclofen (LIORESAL) 10 mg tablet Take 10 mg by mouth twice daily.   ??? buPROPion SR(+) (WELLBUTRIN-SR) 150 mg tablet Take 150 mg by mouth twice daily.   ??? clonazePAM (KLONOPIN) 1 mg tablet Take 1 mg by mouth twice daily.   ??? gabapentin (NEURONTIN) 300 mg capsule Take one capsule by mouth three times daily.   ??? KETOROLAC TROMETHAMINE (KETOROLAC IJ) Inject  to area(s) as directed.   ??? omeprazole DR(+) (PRILOSEC) 40 mg capsule Take 40 mg by mouth daily before breakfast.   ??? rosuvastatin (CRESTOR) 5 mg tablet Take 5 mg by mouth at bedtime daily.     Vitals:    07/25/18 1304   BP: 116/69   Pulse: 82   Weight: 48.5 kg (107 lb)   Height: 162.6 cm (64)   PainSc: Zero     Body mass index is 18.37 kg/m???.     Physical Exam    Alert and in no distress.  Converses and answers questions appropriately.  Speech  is normal without dysarthria.  Face is symmetric with normal movements.         Assessment and Plan:  Impression:  1. 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 Unfortunately, these records are not available today. 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   2. Chronic headaches - Currently on gabapentin and has pain injections which keep headaches reasonably well controlled. These have been attributed to chronic migraine, though features noted on eye exam could suggest a secondary cause to her headaches.    Plan:  1. Obtain MRI head w/wo contrast.  2. Obtain ophthalmology records.  3. Pending records and MRI results may consider LP.    Total time 0 minutes.  Estimated counseling time 25 minutes.  Counseled patient regarding possible diagnoses, their evaluation, and potential treatment courses.

## 2018-07-30 ENCOUNTER — Ambulatory Visit: Admit: 2018-07-30 | Discharge: 2018-07-30

## 2018-07-30 ENCOUNTER — Encounter: Admit: 2018-07-30 | Discharge: 2018-07-30

## 2018-07-30 DIAGNOSIS — G43709 Chronic migraine without aura, not intractable, without status migrainosus: Principal | ICD-10-CM

## 2018-07-30 MED ORDER — GADOBENATE DIMEGLUMINE 529 MG/ML (0.1MMOL/0.2ML) IV SOLN
10 mL | Freq: Once | INTRAVENOUS | 0 refills | Status: CP
Start: 2018-07-30 — End: ?
  Administered 2018-07-30: 15:00:00 10 mL via INTRAVENOUS

## 2018-08-09 ENCOUNTER — Encounter: Admit: 2018-08-09 | Discharge: 2018-08-09

## 2018-08-09 ENCOUNTER — Ambulatory Visit: Admit: 2018-08-09 | Discharge: 2018-08-09

## 2018-08-09 DIAGNOSIS — R51 Headache: Secondary | ICD-10-CM

## 2018-08-09 DIAGNOSIS — M792 Neuralgia and neuritis, unspecified: Secondary | ICD-10-CM

## 2018-08-09 DIAGNOSIS — M5030 Other cervical disc degeneration, unspecified cervical region: Secondary | ICD-10-CM

## 2018-08-09 DIAGNOSIS — M5412 Radiculopathy, cervical region: Secondary | ICD-10-CM

## 2018-08-09 DIAGNOSIS — M7918 Myalgia, other site: Secondary | ICD-10-CM

## 2018-08-09 LAB — COMPREHENSIVE METABOLIC PANEL
Lab: 0.9 mg/dL (ref 0.4–1.00)
Lab: 105 MMOL/L (ref 98–110)
Lab: 14 mg/dL (ref 7–25)
Lab: 142 MMOL/L (ref 137–147)
Lab: 4.1 MMOL/L (ref 3.5–5.1)
Lab: 6.6 g/dL (ref 6.0–8.0)
Lab: 88 mg/dL (ref 70–100)
Lab: 9.8 mg/dL (ref 8.5–10.6)

## 2018-08-09 MED ORDER — GABAPENTIN 300 MG PO CAP
600 mg | ORAL_CAPSULE | Freq: Three times a day (TID) | ORAL | 3 refills | Status: DC
Start: 2018-08-09 — End: 2018-11-20

## 2018-08-09 MED ORDER — BUPIVACAINE (PF) 0.25 % (2.5 MG/ML) IJ SOLN
5 mL | Freq: Once | INTRAMUSCULAR | 0 refills | Status: CP | PRN
Start: 2018-08-09 — End: ?
  Administered 2018-08-09: 17:00:00 5 mL via INTRAMUSCULAR

## 2018-08-09 MED ORDER — LIDOCAINE (PF) 20 MG/ML (2 %) IJ SOLN
5 mL | Freq: Once | INTRAMUSCULAR | 0 refills | Status: CP | PRN
Start: 2018-08-09 — End: ?
  Administered 2018-08-09: 17:00:00 5 mL via INTRAMUSCULAR

## 2018-08-09 NOTE — Progress Notes
[facility]      Subjective     Chief Complaint: neck pain    HPI: Rita Gibson is a 68 y.o. female who  has a past medical history of Generalized headaches. who now presents for evaluation.    Patient presents to clinic for evaluation of low neck, accompanied by patient's spouse Rita Gibson.    Patient reports 50% relief after CESI and cervical TPI performed on 07/05/2018. Yet patient states she continues to have frequent neck spasms and HA pain.     The pain is in the low neck region, will radiate to the baseline of the skull to the right side of the top to the head to the forehead and back of the eyes bilaterally.   Will also note pain that radiates to bilateral shoulders, R>L  Pain is intermittent, episodic  Numbness/tingling: none  The pain ranges 2-5/10  The pain is described as dull, sore, needles  The pain is exacerbated by household chores, lifting arms above shoulder level  The pain is partially alleviated by rest, interventions  +muscle stiffness/tightness  Denies strength loss in BUE    Patient reports currently taking Gabapentin 300mg  TID and Baclofen BID. Patient reports mild relief of pain with medication use, denies any SEs from medication use.    Hx of spine surgery: none    Patient denies fevers, chills, infection, bleeding issues, anticoagulants, new muscle weakness, numbness, tingling, bowel/bladder incontinence, or saddle anesthesia.         PRIOR MEDICATIONS:   Effective  Gabapentin  Baclofen      Ineffective  NSAID    Unable to tolerate  Nortriptyline    Never  Lyrica  Cymbalta  Tizanidine      PRIOR INTERVENTIONS:  No spine surgery   Effective  CESI (>3 months benefit typically)  TPI  ?Supraorbital nerve blocks    Ineffective  Physician-directed PT  Exercise        Rita Gibson denies any recent fevers, chills, infection, antibiotics, bowel or bladder incontinence, saddle anesthesia, bleeding issues, or recent anticoagulant. ROS: All 14 systems reviewed and found to be negative except as above and as follows. +sinus pressure, eye discharge, photophobia, visual disturbance, back pain, neck pain, neck stiffness, HA, confusion.     Past Medical History:  Medical History:   Diagnosis Date   ??? Generalized headaches        Family History:  Family History   Problem Relation Age of Onset   ??? Arthritis Mother    ??? Cancer Father        Social History:  [place] in [place]   Social History     Socioeconomic History   ??? Marital status: Married     Spouse name: Not on file   ??? Number of children: Not on file   ??? Years of education: Not on file   ??? Highest education level: Some college, no degree   Occupational History   ??? Not on file   Tobacco Use   ??? Smoking status: Never Smoker   ??? Smokeless tobacco: Never Used   Substance and Sexual Activity   ??? Alcohol use: Not Currently   ??? Drug use: Never   ??? Sexual activity: Not on file   Other Topics Concern   ??? Not on file   Social History Narrative   ??? Not on file       Allergies:  Allergies   Allergen Reactions   ??? Isometheptene  CHEST TIGHTNESS, CHILLS and MUSCLE PAIN   ??? Sulfa (Sulfonamide Antibiotics) UNKNOWN       Medications:  Current Outpatient Medications on File Prior to Visit   Medication Sig Dispense Refill   ??? baclofen (LIORESAL) 10 mg tablet Take 10 mg by mouth twice daily.     ??? buPROPion SR(+) (WELLBUTRIN-SR) 150 mg tablet Take 150 mg by mouth twice daily.     ??? clonazePAM (KLONOPIN) 1 mg tablet Take 1 mg by mouth twice daily.     ??? gabapentin (NEURONTIN) 300 mg capsule Take one capsule by mouth three times daily. 90 capsule 3   ??? KETOROLAC TROMETHAMINE (KETOROLAC IJ) Inject  to area(s) as directed.     ??? omeprazole DR(+) (PRILOSEC) 40 mg capsule Take 40 mg by mouth daily before breakfast.     ??? rosuvastatin (CRESTOR) 5 mg tablet Take 5 mg by mouth at bedtime daily.       No current facility-administered medications on file prior to visit.        Physical examination: BP 128/69  - Pulse 68  - Temp 36.8 ???C (98.2 ???F) (Oral)  - Resp 14  - Ht 162.6 cm (64)  - Wt 48.1 kg (106 lb)  - SpO2 100%  - BMI 18.19 kg/m???   Pain Score: Zero    General: The patient is a well-developed, well nourished 68 y.o. female in no acute distress.   HEENT: Head is normocephalic and atraumatic. Pupils are equal and reactive to light bilaterally.   Cardiac: Based on palpation, pulse appears to be regular rate and rhythm.   Pulmonary: The patient has unlabored respirations and bilateral symmetric chest excursion.   Abdomen: Soft, nontender, and nondistended. There is no rebound or guarding.   Extremities: No clubbing, cyanosis, or edema.     Neurologic:   The patient is alert and oriented times 3.   Cranial nerves II through XII are intact without any focal deficits.   There is no sensory deficit to light touch or pinprick in the affected areas. There is no allodynia noted.    +moderate pain with palpation at the base of the occiput bilaterally, no radiating pain noted    Moderate TTP to upper trapezius and levator scapulae with increased muscle tone bilaterally, R>L.    Musculoskeletal:   Gait is normal.    C-Spine   There is no point vertebral tenderness in the midline.    There is moderate paraspinal tenderness. Paraspinal muscle tone is increased.  ROM with flexion, extension, rotation, and lateral bending is intact but more stiff.  Strength is equal and adequate bilaterally in the flexors and extensors of the bilateral upper extremities.    Hoffman's is negative bilaterally.      MRI C-Spine Results:    C2-3 mild disc desiccation  C3-4 mod loss of disc height  C4-5 mild disc dessication. Left paracentral disc protrusion. mild R FA.   C5-6 +FA. Broad DOC. Left foraminal and paracentral disc protrusion. Miild-mod L NFS. Mild R NFS. Some effacement of thecal sac.  C6-7 DDD.   Spondylosis most pronounced at C5-6.       Last Cr and LFT's:  Creatinine   Date Value Ref Range Status 08/09/2018 0.90 0.4 - 1.00 MG/DL Final     AST (SGOT)   Date Value Ref Range Status   08/09/2018 20 7 - 40 U/L Final     ALT (SGPT)   Date Value Ref Range Status   08/09/2018 20 7 -  56 U/L Final     Alk Phosphatase   Date Value Ref Range Status   08/09/2018 104 25 - 110 U/L Final     Total Bilirubin   Date Value Ref Range Status   08/09/2018 0.4 0.3 - 1.2 MG/DL Final          Assessment:    Rita Gibson is a 68 y.o. female who  has a past medical history of Generalized headaches. who presents for evaluation of neck pain.     The pain complaints are most likely due to:    1. Cervical radiculopathy  [place] AMB SPINE INJECT INTERLAM CRV/THRC   2. DDD (degenerative disc disease), cervical  [place] AMB SPINE INJECT INTERLAM CRV/THRC   3. Neuropathic pain  COMPREHENSIVE METABOLIC PANEL   4. Myalgia, other site  [place] AMB SPINE PROC TRIGGER POINT INJECTION    Trigger Point       Patient has had an adequate trial of > 3 months of rest, exercise, NSAID's, multimodal treatment, and the passage of time without improvement of symptoms. The pain has significant impact on the daily quality of life.     Plan:    1. Discussed plan of care options with patient, will perform TPI in clinic today.  2. Will plan to repeat CESI and TPI in 3 months.  3. Will plan to increase Gabapentin 600mg  TID for neuropathic pain. Will obtain CMP prior to increase. Discussed risks and benefits of medication therapy and possible SEs, patient verbalized understanding and all questions answered.   4. Patient will discuss Baclofen prescription with PCP, may consider TID dosing regimen.  5. May still consider supraorbital nerve blocks or occipital nerve blocks if headache pain persists.  6. May consider SPG block in future.  7. Encouraged to continue with conservative therapies, including cervical stretching/strengthening. May consider referral to PT in future to aid in in-home regimen.  8. Follow-up after procedure. Risks/benefits of all pharmacologic and interventional treatments discussed and questions answered.     CC: MD M. Assunta Curtis

## 2018-08-09 NOTE — Procedures
Trigger Point  Locations: L upper trapezius, R upper trapezius, R cervical, L cervical, L levator scapulae and R levator scapulae  Consent:   Consent obtained: verbal and written  Consent given by: patient  Alternatives discussed: alternative treatment  Discussed with patient the purpose of the treatment/procedure, other ways of treating my condition, including no treatment/ procedure and the risks and benefits of the alternatives. Patient has decided to proceed with treatment/procedure.        Universal Protocol:  Relevant documents: relevant documents present and verified  Test results: test results available and properly labeled  Imaging studies: imaging studies not available  Required items: required blood products, implants, devices, and special equipment not available  Site marked: the operative site was not marked  Patient identity confirmed: Patient identify confirmed verbally with patient.        Time out: Immediately prior to procedure a "time out" was called to verify the correct patient, procedure, equipment, support staff and site/side marked as required      Procedures Details:   Indications: myalgia and cervicalgiaPrep: alcohol  Local anesthetic: subcutaneous lidocaine  Needle size: 27 G  Number of muscles: 3 or more  Medications administered: 5 mL bupivacaine PF 0.25 %; 5 mL lidocaine PF 20 mg/mL (2 %)  Patient tolerance: Patient tolerated the procedure well with no immediate complications. Pressure was applied, and hemostasis was accomplished.

## 2018-08-15 ENCOUNTER — Encounter: Admit: 2018-08-15 | Discharge: 2018-08-15

## 2018-08-15 DIAGNOSIS — G43719 Chronic migraine without aura, intractable, without status migrainosus: Secondary | ICD-10-CM

## 2018-08-15 NOTE — Telephone Encounter
Received notification from Dr. Vinson Moselle:  "Thank you Clarise Cruz. It appears the optometrist exam was most suggestive of glaucoma. I would like to refer for second opinion to our ophthalmology dept. I do not believe I have a neurologic explanation for these symptoms. "  Called patient to discuss: LVM, placed referral.

## 2018-09-07 IMAGING — CR CHEST
2 series · 2 of 2 positions shown · non-contrast
Comparison: none

[rib upper obl]
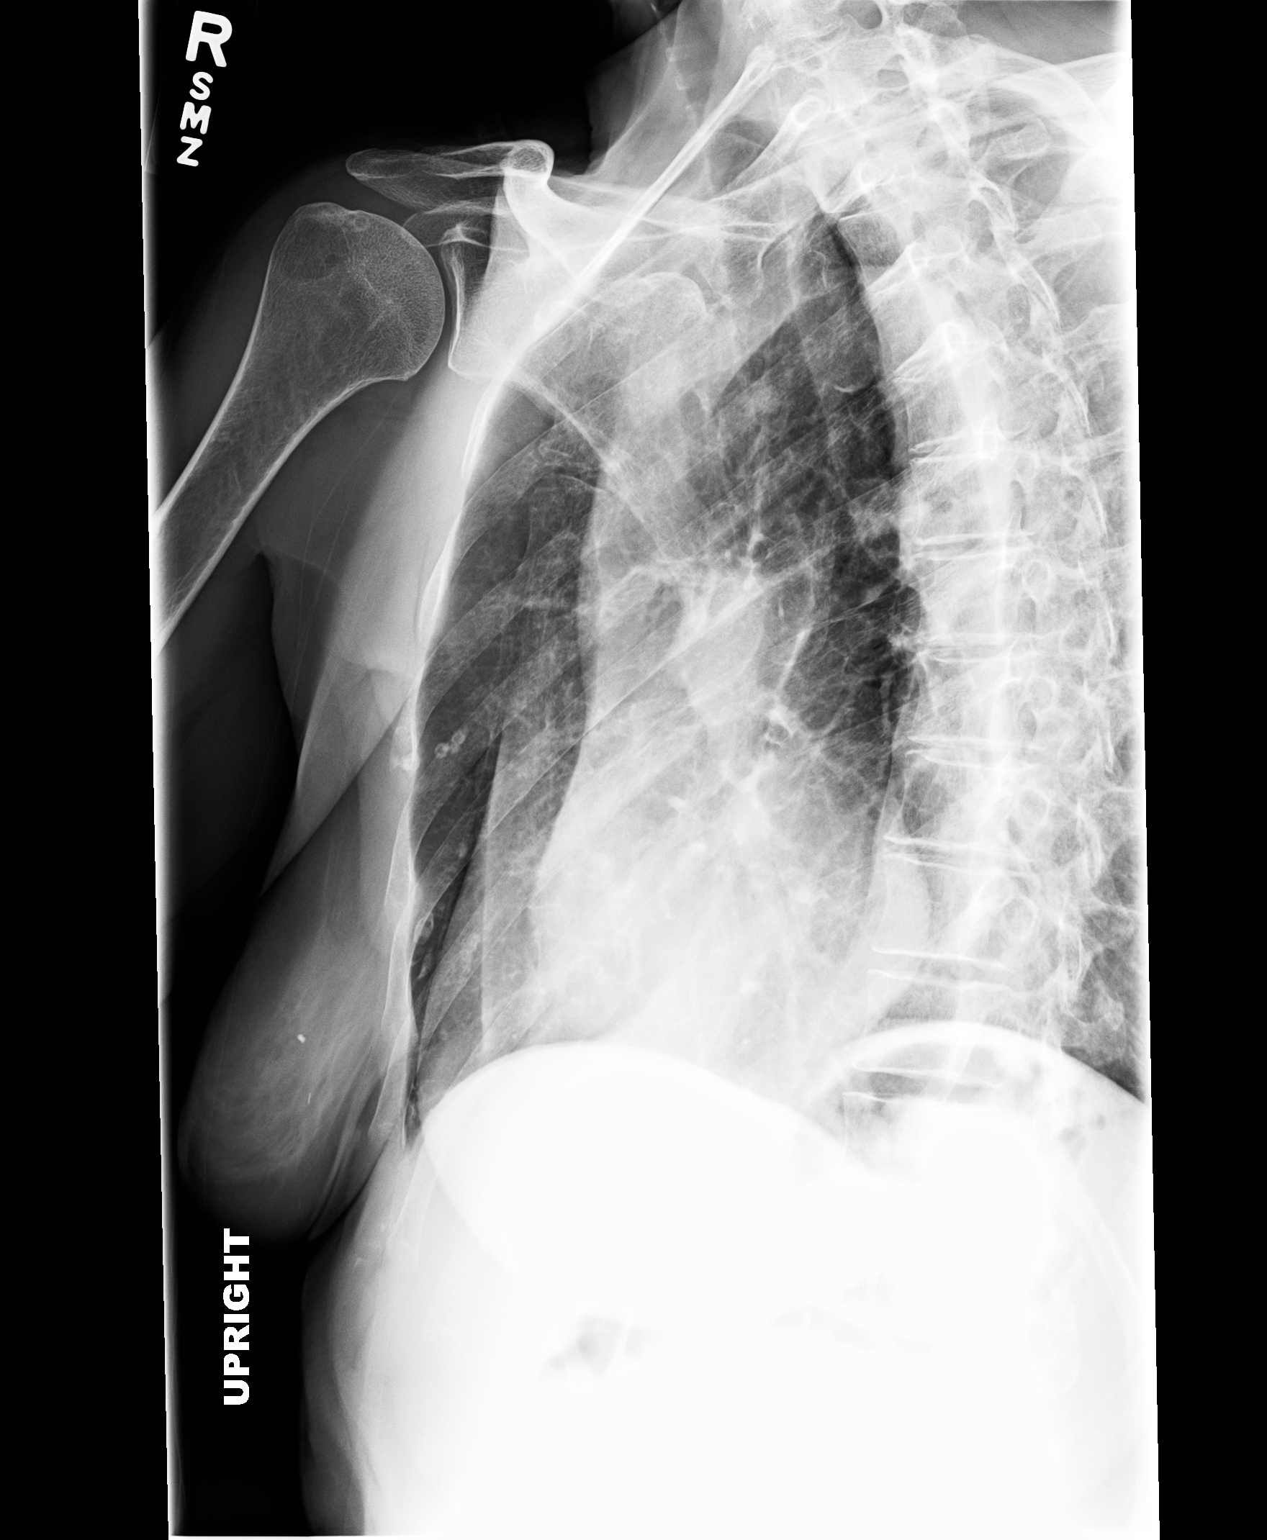

[rib lower]
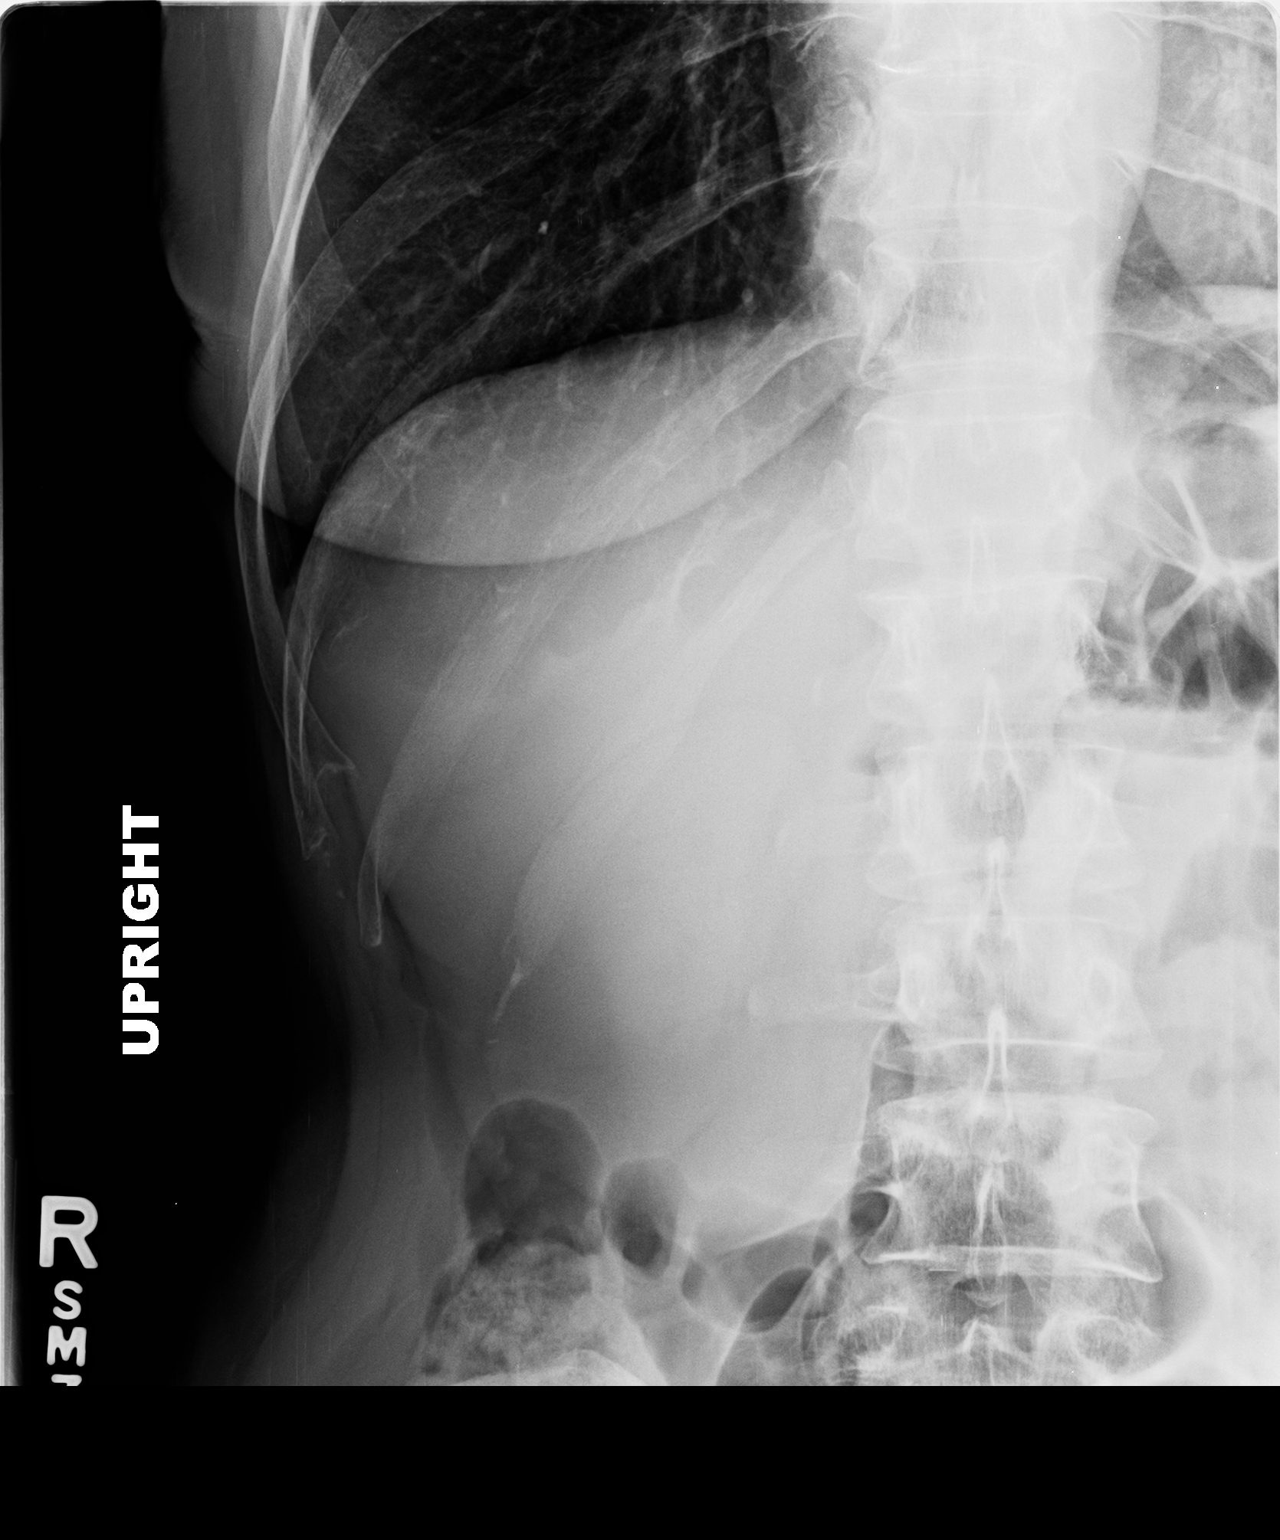

[2 of 2 positions shown; findings below may reference images not displayed]

DIAGNOSTIC STUDIES

EXAM
Right ribs

INDICATION
right rib pain
fall x2;rt posterior mid/lower rib pain with bruising in this area

TECHNIQUE
Detailed views of the right ribs were obtained.

COMPARISONS
None available

FINDINGS
No fractures or other osseous abnormalities are seen.

IMPRESSION
Negative right ribs.

Tech Notes:

fall x2;rt posterior mid/lower rib pain with bruising in this area

## 2018-09-14 ENCOUNTER — Ambulatory Visit: Admit: 2018-09-14 | Discharge: 2018-09-15

## 2018-09-14 ENCOUNTER — Encounter: Admit: 2018-09-14 | Discharge: 2018-09-14

## 2018-09-14 DIAGNOSIS — G43909 Migraine, unspecified, not intractable, without status migrainosus: Secondary | ICD-10-CM

## 2018-09-14 DIAGNOSIS — H469 Unspecified optic neuritis: Principal | ICD-10-CM

## 2018-09-14 DIAGNOSIS — R51 Headache: Secondary | ICD-10-CM

## 2018-09-14 NOTE — Assessment & Plan Note
Likely due to glaucoma. Pt had normal MRI brain    Will rule out other causes of optic neuropathy (infectious, inflammatory, nutritional)    Pt does have FH of glaucoma    If bloodwork normal, consider sleep study to r/o OSA - pt does snore per husband

## 2018-09-14 NOTE — Progress Notes
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 Pt had normal MRI brain. Color plates are normal - no optic disc pallor seen and there is notching     Will rule out other causes of optic neuropathy (infectious, inflammatory, nutritional)    Pt does have FH of glaucoma    If bloodwork normal, consider sleep study to r/o OSA - pt does snore per husband    Return for HVF, pachy, gonio           Return in about 3 weeks (around 10/05/2018) for , [employer] Return - glaucoma eval, gonio, pachy.    Corky Sox, MD  [facility]      HPI:  Patient presents with:  Migraine: NP; referral from Optom in [place] for Disc drusen, ocular htn, APD. Last cee was 2 months ago. Patient states that 04/2018 she noticed that she could not read her clock without glasses without squinting as she had been able to before. With glasses on she can see her clock. She had increased blurred and doubled va. No double vision recently  Double Vision: 04/2018 when reading up close on her phone the image would blur and separate and be too blurry to read then a second or two later would be back to normal. Only happened at night when looking close up. Occured up to five time and has not occured recently.  Eye Problem: After driving over an hour she saw a red spot on the highway suddenly. Happened three times while driving. Lasted a few seconds. Hasn't happened again.   FYI: Glasses were not changed as per optom bc no rx change was necessary.  Pt has been having falls - not due to vision, more just loses balance      Exam:  Base Eye Exam     Visual Acuity (Snellen - Linear)       Right Left    Dist cc 20/25 -2 20/25 -3    Correction:  Glasses          Tonometry (Tonopen, 3:18 PM)       Right Left    Pressure 19 17          Pupils       Dark Light Shape React APD    Right 5 4 Round Brisk 2+    Left 5 4 Round Brisk None          Visual Fields       Left Right Full Full          Extraocular Movement       Right Left     Full, Ortho Full, Ortho   good convergence           Neuro/Psych     Oriented x3:  Yes    Mood/Affect:  Normal          Dilation     Both eyes:  1.0% Tropicamide, 2.5% Phenylephrine @ 3:38 PM            Additional Tests     Color       Right Left    Celedonio Savage - Rittler 6/6 6/6            Slit Lamp and Fundus Exam     Slit Lamp Exam       Right Left    Lids/Lashes  Normal Normal    Conjunctiva/Sclera White and quiet White and quiet    Cornea Clear Clear    Anterior Chamber Deep and quiet Deep and quiet    Iris Flat Flat    Lens 1+ NS 1+ NS          Fundus Exam       Right Left    Vitreous Normal Normal    Disc notching, no pallor Sharp, healthy rim    C/D Ratio 0.7 0.2    Macula Flat Flat    Vessels Normal caliber and number Normal caliber and number    Periphery Attached, no breaks or tears Attached, no breaks or tears            Refraction     Wearing Rx       Sphere Cylinder Axis Add    Right -4.00 +1.75 088 +2.50    Left -4.25 +1.25 095 +2.50    Age:  34yrs    Type:  PAL                SCAN COMP OPHTHAL DIAG IMG, OP NERVE     OCT Left Eye   Left Eye  Optic Nerve Findings: Normal    Right Eye   Right Eye  Optic Nerve Findings: Abnormal Thinning   Baseline OU

## 2018-09-15 ENCOUNTER — Ambulatory Visit: Admit: 2018-09-15 | Discharge: 2018-09-15

## 2018-09-15 DIAGNOSIS — H469 Unspecified optic neuritis: Principal | ICD-10-CM

## 2018-09-15 LAB — CBC AND DIFF
Lab: 3.7 M/UL — ABNORMAL LOW (ref 4.0–5.0)
Lab: 33 % — ABNORMAL LOW (ref 36–45)
Lab: 4.6 10*3/uL (ref 4.5–11.0)

## 2018-09-15 LAB — FOLATE, SERUM: Lab: 9.3 ng/mL (ref >3.9)

## 2018-09-15 LAB — SYPHILIS AB SCREEN: Lab: NEGATIVE

## 2018-09-15 LAB — VITAMIN B12: Lab: 346 pg/mL (ref 180–914)

## 2018-09-16 LAB — ANGIOTENSIN CONV ENZYME (ACE): Lab: 67 FL (ref 80–100)

## 2018-09-16 LAB — LYME DISEASE W/ REFLEX TO WESTERN BLOT: Lab: NEGATIVE

## 2018-09-18 LAB — ANTI-NUCLEAR ANTIBODY(ANA): Lab: <80 {titer} — ABNORMAL LOW (ref <80)

## 2018-09-18 LAB — T SPOT TB (QUANTIFERON TB)
Lab: 1
Lab: NEGATIVE

## 2018-10-13 ENCOUNTER — Encounter: Admit: 2018-10-13 | Discharge: 2018-10-13

## 2018-10-13 DIAGNOSIS — M542 Cervicalgia: Secondary | ICD-10-CM

## 2018-10-15 ENCOUNTER — Encounter: Admit: 2018-10-15 | Discharge: 2018-10-15

## 2018-10-16 ENCOUNTER — Encounter: Admit: 2018-10-16 | Discharge: 2018-10-16

## 2018-10-16 ENCOUNTER — Ambulatory Visit: Admit: 2018-10-16 | Discharge: 2018-10-17

## 2018-10-16 DIAGNOSIS — M5412 Radiculopathy, cervical region: Principal | ICD-10-CM

## 2018-10-16 DIAGNOSIS — M5030 Other cervical disc degeneration, unspecified cervical region: Secondary | ICD-10-CM

## 2018-10-16 DIAGNOSIS — M542 Cervicalgia: Secondary | ICD-10-CM

## 2018-10-16 DIAGNOSIS — M7918 Myalgia, other site: Secondary | ICD-10-CM

## 2018-10-16 MED ORDER — LATANOPROST 0.005 % OP DROP
1 [drp] | Freq: Every evening | OPHTHALMIC | 11 refills | 28.00000 days | Status: DC
Start: 2018-10-16 — End: 2019-10-04

## 2018-10-16 MED ORDER — IOHEXOL 240 MG IODINE/ML IV SOLN
2.5 mL | Freq: Once | EPIDURAL | 0 refills | Status: CP
Start: 2018-10-16 — End: ?
  Administered 2018-10-16: 21:00:00 2.5 mL via EPIDURAL

## 2018-10-16 MED ORDER — TRIAMCINOLONE ACETONIDE 40 MG/ML IJ SUSP
80 mg | Freq: Once | EPIDURAL | 0 refills | Status: CP
Start: 2018-10-16 — End: ?
  Administered 2018-10-16: 21:00:00 80 mg via EPIDURAL

## 2018-10-16 NOTE — Procedures
Attending Surgeon: Evelina Bucy, MD    Anesthesia: Local    Pre-Procedure Diagnosis:   1. Cervical radiculopathy    2. Myalgia, other site    3. DDD (degenerative disc disease), cervical        Post-Procedure Diagnosis:   1. Cervical radiculopathy    2. Myalgia, other site    3. DDD (degenerative disc disease), cervical        ESI CRV/THRC  Procedure: epidural - interlaminar    Laterality: n/a    Location: cervical ESI with imaging - C7-T1      Consent:   Consent obtained: written  Consent given by: patient  Risks discussed: allergic reaction, bleeding, bruising, infection, never damage, no change or worsening in pain, pneumo thorax, reaction to medication, seizure, swelling and weakness  Alternatives discussed: alternative treatment, delayed treatment and no treatment  Discussed with patient the purpose of the treatment/procedure, other ways of treating my condition, including no treatment/ procedure and the risks and benefits of the alternatives. Patient has decided to proceed with treatment/procedure.        Universal Protocol:  Relevant documents: relevant documents present and verified  Site marked: the operative site was marked  Patient identity confirmed: Patient identify confirmed verbally with patient.        Time out: Immediately prior to procedure a time out was called to verify the correct patient, procedure, equipment, support staff and site/side marked as required      Procedures Details:   Indications: pain   Prep: chlorhexidine  Patient position: prone  Estimated Blood Loss: minimal  Specimens: none  Amount Injected:   C7-T1: 4mL    Number of Levels: 1  Approach: midline  Guidance: fluoroscopy  Contrast: Procedure confirmed with contrast under live fluoroscopy.  Needle and Epidural Catheter: tuohy  Needle size: 18 G  Injection procedure: Incremental injection and Negative aspiration for blood  Patient tolerance: Patient tolerated the procedure well with no immediate complications. Pressure was applied, and hemostasis was accomplished.  Outcome: Pain unchanged  Comments:   CERVICAL INTERLAMINAR EPIDURAL STEROID INJECTION    PROCEDURE:  1) C7-T1 interlaminar epidural steroid injection  2) Fluoroscopic needle guidance    REASON FOR PROCEDURE: Cervical radiculopathy, DDD (cervical)    ATTENDING PHYSICIAN: Evelina Bucy, MD    MEDICATIONS INJECTED: 2 mL of triamciniolone (80 mg) and 2 mL of sterile, preservative-free normal saline    LOCAL ANESTHETIC INJECTED: 1 mL of 1% lidocaine    SEDATION MEDICATIONS: None    ESTIMATED BLOOD LOSS: None    SPECIMENS REMOVED: None    COMPLICATIONS: None    TECHNIQUE: Time-out was taken to identify the correct patient, procedure and side prior to starting the procedure. With the patient lying in a prone position with the neck in a slightly  flexed position, the area was prepped and draped in sterile fashion using DuraPrep and a fenestrated drape. The area was determined under fluoroscopic guidance. A 27-gauge, 1.25-inch  needle was used to anesthetize the needle entry site and subcutaneous tissues.     The 18-gauge, 3.5-inch Tuohy needle was advanced through the ligamentum flavum using loss of resistance technique. Once the tip of the needle was thought to be in the desired position, contrast was injected to confirm only epidural spread and no vascular runoff via A-P and lateral  views. The injectate was then injected slowly with intermittent negative aspiration.    The procedure was completed without complications and was tolerated well. The patient was monitored after the  procedure. The patient (or responsible party) was given post-procedure and  discharge instructions to follow at home. The patient was discharged in stable condition.    Evelina Bucy, MD, MBA      Trigger Point Injection  Locations: L cervical, R cervical, L upper trapezius, R upper trapezius, L levator scapulae and R levator scapulae  Consent:   Consent obtained: written Consent given by: patient  Alternatives discussed: alternative treatment, delayed treatment and no treatment  Discussed with patient the purpose of the treatment/procedure, other ways of treating my condition, including no treatment/ procedure and the risks and benefits of the alternatives. Patient has decided to proceed with treatment/procedure.        Universal Protocol:  Relevant documents: relevant documents present and verified  Site marked: the operative site was marked  Patient identity confirmed: Patient identify confirmed verbally with patient.        Time out: Immediately prior to procedure a time out was called to verify the correct patient, procedure, equipment, support staff and site/side marked as required      Procedures Details:   Indications: muscle spasm and myalgiaPrep: 2% chlorhexidine  Needle size: 27 G  Number of muscles: 3 or more  Approach: posterior  Patient tolerance: Patient tolerated the procedure well with no immediate complications. Pressure was applied, and hemostasis was accomplished.  Comments:   PROCEDURE: Trigger Point Injections    Pre-Procedure Diagnoses:  1. Myalgia, other  2. Myofascial pain  3. Muscle spasm    Post-Procedure Diagnoses:  Same    Physician: Evelina Bucy, MD    ANESTHESIA: Lidocaine 1%    COMPLICATIONS: None.    Location(s) of pain: As noted above  Presence of point tenderness in a tight band of muscle in above area: Yes  Restricted range of motion: Yes  Conservative therapy attempted for at least 6 weeks: activity modification, pharmacotherapy  Other components of comprehensive management: pharmacotherapy, activity modification    DESCRIPTION OF PROCEDURE: The procedure risks, hazards and alternatives were discussed with the patient and a proper consent was obtained. The area over each trigger point was prepped with alcohol utilizing sterile technique. After isolating it between two palpating fingertips a 27-gauge 1.5 needle was placed in the center of the myofascial spasms and a negative aspiration was performed. Then 0.5cc of the solution above was injected into each trigger point.     Trigger points in each of the muscle groups noted above were injected. A total of 5 ml of the local anesthetic solution was utilized.    The patient tolerated the procedure well without any apparent difficulties or complications.          Estimated blood loss: none or minimal  Specimens: none  Patient tolerated the procedure well with no immediate complications. Pressure was applied, and hemostasis was accomplished.

## 2018-10-16 NOTE — Patient Instructions
Procedure Completed Today: Cervical Epidural Steroid Injection    Important information following your procedure today: You may drive today    1. Pain relief may not be immediate. It is possible you may even experience an increase in pain during the first 24-48 hours followed by a gradual decrease of your pain.  2. Though the procedure is generally safe and complications are rare, we do ask that you be aware of any of the following:   ? Any swelling, persistent redness, new bleeding, or drainage from the site of the injection.  ? You should not experience a severe headache.  ? You should not run a fever over 101??? F.  ? New onset of sharp, severe back & or neck pain.  ? New onset of upper or lower extremity numbness or weakness.  ? New difficulty controlling bowel or bladder function after the injection.  ? New shortness of breath.    If any of these occur, please call to report this occurrence to a nurse at [phone]. If you are calling after 4:00 p.m., on weekends or holidays please call [phone] and ask to have the resident physician on call for the physician paged or go to your local emergency room.  3. You may experience soreness at the injection site. Ice can be applied at 20 minute intervals. Avoid application of direct heat, hot showers or hot tubs today.  4. Avoid strenuous activity today. You may resume your regular activities and exercise tomorrow.  5. Patients with diabetes may see an elevation in blood sugars for 7-10 days after the injection. It is important to pay close attention to your diet, check your blood sugars daily and report extreme elevations to the physician that treats your diabetes.  6. Patients taking a daily blood thinner can resume their regular dose this evening.  7. It is important that you take all medications ordered by your pain physician. Taking medication as ordered is an important part of your pain care plan. If you cannot continue the medication plan, please notify the physician.     Possible side effects to steroids that may occur:  ? Flushing or redness of the face  ? Irritability  ? Fluid retention  ? Change in women???s menses    The following medications were used: Lidocaine , Triamcinolone   and Contrast Dye

## 2018-10-16 NOTE — Progress Notes
Assessment and Plan:    Problem   Low-Tension Glaucoma of Both Eyes, Severe Stage    Asymmetric    PT had MRI and bloodwork (all WNL) to r/o other causes of optic neuropathy         Low-tension glaucoma of both eyes, severe stage  Severe OD, very mild OS    Start latanoprost QHS OU    Recommend sleep study - sleep apnea could be causing nocturnal hypotension that can lead to low tension glaucoma    Pt prefers to arrange sleep study closer to home in [place]               Return in about 8 weeks (around 12/11/2018) for Glaucoma, Return Short - IOP check.    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  Glaucoma Evaluation today  Eye Pain: No pain or discomfort reported today.  Other: Pt unsure if mom had glaucoma or cataracts for eye history.  Medications Only: No eye drops currently being used. Pt is still noticing decreased vision OD        Exam:  Base Eye Exam     Visual Acuity (Snellen - Linear)       Right Left    Dist cc 20/30push 20/25    Dist ph cc NI  NI    Correction:  Glasses          Tonometry (Tonopen, 8:30 AM)       Right Left    Pressure 22 17          Tonometry #2 (Applanation, 8:44 AM)       Right Left    Pressure 20 18          Pachymetry (10/16/2018)       Right Left    Thickness 576 577          Gonioscopy       Right Left    Temporal 4 4    Nasal 4 4    Superior 4 4    Inferior 4 4          Pupils       Dark Light React APD    Right 4.0 3.0 + 2+ APD    Left 4.0 3.0 + 0          Visual Fields    24-2 HVF done today           Extraocular Movement       Right Left     Ortho Ortho     -- -- --   --  --   -- -- --    -- -- --   --  --   -- -- --             Neuro/Psych     Oriented x3:  Yes    Mood/Affect:  Normal            Slit Lamp and Fundus Exam     Slit Lamp Exam       Right Left    Lids/Lashes Normal Normal    Conjunctiva/Sclera White and quiet White and quiet    Cornea Clear Clear Anterior Chamber Deep and quiet Deep and quiet    Iris Flat Flat    Lens 1+ NS 1+ NS  Fundus Exam       Right Left    Vitreous Normal Normal    Disc notching, no pallor Sharp, healthy rim    C/D Ratio 0.7 0.2            Refraction     Wearing Rx       Sphere Cylinder Axis Add    Right -4.00 +1.75 088 +2.50    Left -4.25 +1.25 095 +2.50    Type:  PAL                VISUAL FIELD, EXTEND     Visual Field Examination Type  [facility]rey Automated  Laterality   Both eyes  Right Eye  Threshold: 24-2  Reliability:  Good  Left Eye  Threshold: 24-2  Reliability:  Good   Central island OD; small enlarged BS OS       GONIOSCOPY           Interpretation / Result:  See exam notes for details    Open OU

## 2018-10-16 NOTE — Progress Notes

## 2018-10-16 NOTE — Patient Instructions
Start latanoprost one drop at bedtime both eyes    Recommend sleep study to rule out sleep apnea    [email or web address] - low tension glaucoma

## 2018-10-16 NOTE — Progress Notes
SPINE CENTER  INTERVENTIONAL PAIN PROCEDURE HISTORY AND PHYSICAL    Chief Complaint   Patient presents with   ??? Neck - Pain   ??? Neck Pain   ??? Headache       HISTORY OF PRESENT ILLNESS:  Neck pain with radicular component. +myalgia    Medical History:   Diagnosis Date   ??? Generalized headaches    ??? Migraines        Surgical History:   Procedure Laterality Date   ??? ANKLE SURGERY     ??? HEART CATHETERIZATION     ??? TUBAL LIGATION         family history includes Arthritis in her mother; Cancer in her father; Cataract in her mother; Glaucoma in her mother.    Social History     Socioeconomic History   ??? Marital status: Married     Spouse name: Not on file   ??? Number of children: Not on file   ??? Years of education: Not on file   ??? Highest education level: Some college, no degree   Occupational History   ??? Not on file   Tobacco Use   ??? Smoking status: Never Smoker   ??? Smokeless tobacco: Never Used   Substance and Sexual Activity   ??? Alcohol use: Not Currently   ??? Drug use: Never   ??? Sexual activity: Not on file   Other Topics Concern   ??? Not on file   Social History Narrative   ??? Not on file       Allergies   Allergen Reactions   ??? Isometheptene CHEST TIGHTNESS, CHILLS and MUSCLE PAIN   ??? Sulfa (Sulfonamide Antibiotics) UNKNOWN       Vitals:    10/16/18 1437   BP: 129/74   Pulse: 75   Temp: 36.8 ???C (98.2 ???F)   SpO2: 99%   Weight: 48.1 kg (106 lb)   Height: 162.6 cm (64)       REVIEW OF SYSTEMS: 10 point ROS obtained and negative      PHYSICAL EXAM:    General: The patient is a well-developed, well nourished 68 y.o. female in no acute distress.   HEENT: Head is normocephalic and atraumatic.   Cardiac: Based on palpation, pulse appears to be regular rate and rhythm.   Pulmonary: The patient has unlabored respirations and bilateral symmetric chest excursion.   Abdomen: Soft, nontender, and nondistended.   Extremities: No clubbing, cyanosis, or edema.     Neurologic:   The patient is alert and oriented times 3. Moderate TTP to upper trapezius and levator scapulae with increased muscle tone bilaterally, R>L.    Musculoskeletal:     C-Spine   There is moderate paraspinal tenderness. Paraspinal muscle tone is increased.          IMPRESSION:    1. Cervical radiculopathy    2. Myalgia, other site    3. DDD (degenerative disc disease), cervical         PLAN: Cervical Epidural Steroid Injection C7-T1 and TPI neck/upper back

## 2018-10-17 ENCOUNTER — Ambulatory Visit: Admit: 2018-10-16 | Discharge: 2018-10-16

## 2018-10-17 ENCOUNTER — Ambulatory Visit: Admit: 2018-10-16 | Discharge: 2018-10-17

## 2018-10-17 DIAGNOSIS — M5030 Other cervical disc degeneration, unspecified cervical region: Secondary | ICD-10-CM

## 2018-10-17 DIAGNOSIS — M7918 Myalgia, other site: Secondary | ICD-10-CM

## 2018-10-17 DIAGNOSIS — H401233 Low-tension glaucoma, bilateral, severe stage: Secondary | ICD-10-CM

## 2018-10-17 DIAGNOSIS — Z882 Allergy status to sulfonamides status: Secondary | ICD-10-CM

## 2018-11-07 ENCOUNTER — Encounter: Admit: 2018-11-07 | Discharge: 2018-11-07 | Payer: MEDICARE

## 2018-11-07 NOTE — Telephone Encounter
Pt says that Dr. Delma Freeze ordered a test that her insurance will not cover in the hospital or clinic but they are saying they will cover it at home. Would the at home version of the test give Dr. Delma Freeze the information she needs ?

## 2018-11-10 ENCOUNTER — Encounter: Admit: 2018-11-10 | Discharge: 2018-11-10 | Payer: MEDICARE

## 2018-11-13 ENCOUNTER — Encounter: Admit: 2018-11-13 | Discharge: 2018-11-13 | Payer: MEDICARE

## 2018-11-20 ENCOUNTER — Encounter

## 2018-11-20 DIAGNOSIS — M5030 Other cervical disc degeneration, unspecified cervical region: Secondary | ICD-10-CM

## 2018-11-20 DIAGNOSIS — R51 Headache: Secondary | ICD-10-CM

## 2018-11-20 DIAGNOSIS — M5412 Radiculopathy, cervical region: Secondary | ICD-10-CM

## 2018-11-20 DIAGNOSIS — M7918 Myalgia, other site: Secondary | ICD-10-CM

## 2018-11-20 MED ORDER — GABAPENTIN 300 MG PO CAP
900 mg | ORAL_CAPSULE | Freq: Three times a day (TID) | ORAL | 3 refills | Status: DC
Start: 2018-11-20 — End: 2019-03-05

## 2018-11-20 NOTE — Progress Notes
Obtained patient's verbal consent to treat them and their agreement to [facility] financial policy and NPP via this telehealth visit during the [facility] Emergency    [facility] - Interventional Pain      Subjective     Chief Complaint: neck pain    HPI: Shweta Aman is a 68 y.o. female who  has a past medical history of Generalized headaches and Migraines. who now presents for evaluation.    Patient presents to clinic for evaluation of low neck, accompanied by patient's spouse Rosanne Ashing.    Patient reports about 50% relief after CESI and cervical TPI performed on 10/16/2018. Patient states she does feel that she has more intense pain episodes.     The pain is in the low neck region, will radiate to the baseline of the skull to the right side of the top to the head to the forehead and back of the eyes bilaterally.   Will also note pain that radiates to bilateral shoulders, R>L  Pain is intermittent, episodic  Pain is typically worse in the late afternoon  Numbness/tingling: none  The pain ranges 1-10/10  The pain is described as dull, sore, needles, stabbing   The pain is exacerbated by household chores, lifting arms above shoulder level  The pain is partially alleviated by rest, interventions  +muscle stiffness/tightness  +sleep disturbance   Denies strength loss in BUE    Patient reports currently taking Gabapentin 600mg  TID and Baclofen BID. Patient reports mild relief of pain with medication use, denies any SEs from medication use.    Patient reports recent dx of Glaucoma, told of 90% loss in the right eye.     Hx of spine surgery: none    Patient denies fevers, chills, infection, bleeding issues, anticoagulants, new muscle weakness, numbness, tingling, bowel/bladder incontinence, or saddle anesthesia.         PRIOR MEDICATIONS:   Effective  Gabapentin  Baclofen      Ineffective  NSAID    Unable to tolerate  Nortriptyline    Never  Lyrica  Cymbalta  Tizanidine PRIOR INTERVENTIONS:  No spine surgery   Effective  CESI (>3 months benefit typically)  TPI  ?Supraorbital nerve blocks    Ineffective  Physician-directed PT  Exercise        Azilee Pirro denies any recent fevers, chills, infection, antibiotics, bowel or bladder incontinence, saddle anesthesia, bleeding issues, or recent anticoagulant.     ROS: All 14 systems reviewed and found to be negative except as above and as follows. +sinus pressure, eye discharge, photophobia, visual disturbance, back pain, neck pain, neck stiffness, HA, confusion.     Past Medical History:  Medical History:   Diagnosis Date   ? Generalized headaches    ? Migraines        Family History:  Family History   Problem Relation Age of Onset   ? Arthritis Mother    ? Cataract Mother    ? Glaucoma Mother    ? Cancer Father    ? Amblyopia Neg Hx    ? Blindness Neg Hx    ? Strabismus Neg Hx    ? Retinal Detachment Neg Hx    ? Macular Degen Neg Hx        Social History:  [place] in [place]   Social History     Socioeconomic History   ? Marital status: Married     Spouse name: Not on file   ? Number of  children: Not on file   ? Years of education: Not on file   ? Highest education level: Some college, no degree   Occupational History   ? Not on file   Tobacco Use   ? Smoking status: Never Smoker   ? Smokeless tobacco: Never Used   Substance and Sexual Activity   ? Alcohol use: Not Currently   ? Drug use: Never   ? Sexual activity: Not on file   Other Topics Concern   ? Not on file   Social History Narrative   ? Not on file       Allergies:  Allergies   Allergen Reactions   ? Isometheptene CHEST TIGHTNESS, CHILLS and MUSCLE PAIN   ? Sulfa (Sulfonamide Antibiotics) UNKNOWN       Medications:  Current Outpatient Medications on File Prior to Visit   Medication Sig Dispense Refill   ? alendronate (FOSAMAX) 70 mg tablet      ? baclofen (LIORESAL) 10 mg tablet Take 10 mg by mouth twice daily. ? buPROPion SR(+) (WELLBUTRIN-SR) 150 mg tablet Take 150 mg by mouth twice daily.     ? calcium carbonate (CALCIUM 500 PO) Take  by mouth.     ? cholecalciferol (VITAMIN D-3) 1,000 units tablet Take 1,000 Units by mouth daily.     ? clonazePAM (KLONOPIN) 1 mg tablet Take 1 mg by mouth twice daily.     ? KETOROLAC TROMETHAMINE (KETOROLAC IJ) Inject  to area(s) as directed.     ? latanoprost (XALATAN) 0.005 % ophthalmic solution Apply one drop to both eyes at bedtime daily. 2.5 mL 11   ? omeprazole DR(+) (PRILOSEC) 40 mg capsule Take 40 mg by mouth daily before breakfast.     ? rosuvastatin (CRESTOR) 5 mg tablet Take 5 mg by mouth at bedtime daily.       No current facility-administered medications on file prior to visit.        Physical examination:   There were no vitals taken for this visit.  Pain Score: One    Exam performed by instructing patient to perform various maneuvers and provide feedback while I personally visualized the patient exam.     General: The patient is a well-developed, well nourished 68 y.o. female in no acute distress.   HEENT: Head is normocephalic and atraumatic.   Pulmonary: The patient has unlabored respirations and bilateral symmetric chest excursion.   Abdomen: Soft, nontender, and nondistended. There is no rebound or guarding per patient assessment.  Extremities: No clubbing, cyanosis, or edema as seen on video.    Neurologic:   The patient is alert and oriented times 3.   Cranial nerves II through XII are intact without any focal deficits.   There is no sensory deficit to light touch or pinprick in the affected areas. There is no allodynia noted.    +moderate pain with palpation at the base of the occiput bilaterally, no radiating pain noted    Moderate TTP to upper trapezius and levator scapulae with increased muscle tone bilaterally, R>L.    Musculoskeletal:   Gait is normal.    C-Spine    There is moderate paraspinal tenderness. Paraspinal muscle tone is increased. ROM with flexion, extension, rotation, and lateral bending is intact but more stiff.  Patient believes strength is equal and adequate bilaterally in the flexors and extensors of the bilateral upper extremities.          MRI C-Spine Results:  11/2015  C2-3 mild disc desiccation  C3-4 mod loss of disc height  C4-5 mild disc dessication. Left paracentral disc protrusion. mild R FA.   C5-6 +FA. Broad DOC. Left foraminal and paracentral disc protrusion. Miild-mod L NFS. Mild R NFS. Some effacement of thecal sac.  C6-7 DDD.   Spondylosis most pronounced at C5-6.       Last Cr and LFT's:  Creatinine   Date Value Ref Range Status   08/09/2018 0.90 0.4 - 1.00 MG/DL Final     AST (SGOT)   Date Value Ref Range Status   08/09/2018 20 7 - 40 U/L Final     ALT (SGPT)   Date Value Ref Range Status   08/09/2018 20 7 - 56 U/L Final     Alk Phosphatase   Date Value Ref Range Status   08/09/2018 104 25 - 110 U/L Final     Total Bilirubin   Date Value Ref Range Status   08/09/2018 0.4 0.3 - 1.2 MG/DL Final          Assessment:    Perlina Kohman is a 68 y.o. female who  has a past medical history of Generalized headaches and Migraines. who presents for evaluation of neck pain.     The pain complaints are most likely due to:    1. Cervical radiculopathy  [place] AMB SPINE INJECT INTERLAM CRV/THRC   2. DDD (degenerative disc disease), cervical  [place] AMB SPINE INJECT INTERLAM CRV/THRC   3. Myalgia, other site  [place] AMB SPINE PROC TRIGGER POINT INJECTION   4. Chronic intractable headache, unspecified headache type   AMB NERVE BLOCK CLINIC       Patient has had an adequate trial of > 3 months of rest, exercise, NSAID's, multimodal treatment, and the passage of time without improvement of symptoms. The pain has significant impact on the daily quality of life.     Plan:    1. Discussed plan of care options with patient. Will schedule for bilateral SPG blocks for series of 3 in clinic at next available appointment. 2. Will plan to repeat CESI and TPI in 2 months.  3. Will plan to increase Gabapentin 900mg  TID for neuropathic pain. Discussed risks and benefits of medication therapy and possible SEs, patient verbalized understanding and all questions answered.   4. May consider facet-directed therapies in future.  5. May still consider supraorbital nerve blocks.  6. Follow-up after procedure.     Risks/benefits of all pharmacologic and interventional treatments discussed and questions answered.     Todays visit took place via face-to-face encounter utilizing Zoom application. Visit Start Time 0940 Visit End Time 1015.

## 2018-11-27 LAB — COVID-19 (SARS-COV-2) PCR

## 2018-11-28 ENCOUNTER — Encounter: Admit: 2018-11-28 | Discharge: 2018-11-28 | Payer: MEDICARE

## 2018-11-28 ENCOUNTER — Ambulatory Visit: Admit: 2018-11-28 | Discharge: 2018-11-29 | Payer: MEDICARE

## 2018-11-28 NOTE — Progress Notes
Obtained patient's verbal consent to treat them and their agreement to [facility] financial policy and NPP via this telehealth visit during the [facility] Emergency    The following visit was completed via Zoom (Audio/Video).    Rita Gibson is a 68 y.o. female.    CC: headache follow up       History of Present Illness    She presents for follow up. Headaches have been worse recently.     Currently on 3 tabs of gabapentin three times daily. Has not had any headaches over the past 1 week. Did have a brief one above the left eye this morning which went away. Has light and sound sensitivity and nausea with headaches. Headaches may last up to 7 days at their worst. Scheduled to have SPG blocks on the 9th, 16th, and 23rd of October.    Current meds:  baclofen  gabapentin  trigger point injections periodically    Prior headache medications:  mirtazapine  amlodipine  amitriptyline  nortriptyline  fioricet  pregabalin  xanax  topiramate  tizanidine  verapail  depakote  propranolol  CoQ10       Medical History:   Diagnosis Date   ? Generalized headaches    ? Migraines      Surgical History:   Procedure Laterality Date   ? ANKLE SURGERY     ? HEART CATHETERIZATION     ? TUBAL LIGATION       Social History     Socioeconomic History   ? Marital status: Married     Spouse name: Not on file   ? Number of children: Not on file   ? Years of education: Not on file   ? Highest education level: Some college, no degree   Occupational History   ? Not on file   Tobacco Use   ? Smoking status: Never Smoker   ? Smokeless tobacco: Never Used   Substance and Sexual Activity   ? Alcohol use: Not Currently   ? Drug use: Never   ? Sexual activity: Not on file   Other Topics Concern   ? Not on file   Social History Narrative   ? Not on file     Family History   Problem Relation Age of Onset   ? Arthritis Mother    ? Cataract Mother    ? Glaucoma Mother    ? Cancer Father    ? Amblyopia Neg Hx    ? Blindness Neg Hx ? Strabismus Neg Hx    ? Retinal Detachment Neg Hx    ? Macular Degen Neg Hx        Review of Systems      Objective:         ? alendronate (FOSAMAX) 70 mg tablet    ? baclofen (LIORESAL) 10 mg tablet Take 10 mg by mouth twice daily.   ? buPROPion SR(+) (WELLBUTRIN-SR) 150 mg tablet Take 150 mg by mouth twice daily.   ? calcium carbonate (CALCIUM 500 PO) Take  by mouth.   ? cholecalciferol (VITAMIN D-3) 1,000 units tablet Take 1,000 Units by mouth daily.   ? clonazePAM (KLONOPIN) 1 mg tablet Take 1 mg by mouth twice daily.   ? gabapentin (NEURONTIN) 300 mg capsule Take three capsules by mouth three times daily. Take 600/600/900mg  x7days, then 900/600/900mg  x7days, then 900/900/900mg    ? KETOROLAC TROMETHAMINE (KETOROLAC IJ) Inject  to area(s) as directed.   ? latanoprost (XALATAN) 0.005 % ophthalmic solution  Apply one drop to both eyes at bedtime daily.   ? omeprazole DR(+) (PRILOSEC) 40 mg capsule Take 40 mg by mouth daily before breakfast.   ? rosuvastatin (CRESTOR) 5 mg tablet Take 5 mg by mouth at bedtime daily.     There were no vitals filed for this visit.  There is no height or weight on file to calculate BMI.     Physical Exam    Alert and in no distress.  Converses and answers questions appropriately.  Speech is normal without dysarthria.  Face is symmetric with normal movements.         Assessment and Plan:    Chronic migraine with total headache frequency often in excess of 15 days per month, most of which have migrainous features. We discussed alternative options for headache treatment including CGRP antagonists and botox. She has a plan arranged for a series of SPG blocks. Would be reasonable to go forward with this plan, then can re-discuss medical options in the future depending on response.    Total time 15 minutes.  Estimated counseling time 10 minutes.  Counseled patient regarding headache treatment.

## 2018-12-01 ENCOUNTER — Encounter: Admit: 2018-12-01 | Discharge: 2018-12-01 | Payer: MEDICARE

## 2018-12-01 ENCOUNTER — Ambulatory Visit: Admit: 2018-12-01 | Discharge: 2018-12-01 | Payer: MEDICARE

## 2018-12-01 DIAGNOSIS — R519 Chronic intractable headache, unspecified headache type: Secondary | ICD-10-CM

## 2018-12-01 DIAGNOSIS — M792 Neuralgia and neuritis, unspecified: Secondary | ICD-10-CM

## 2018-12-01 DIAGNOSIS — G43909 Migraine, unspecified, not intractable, without status migrainosus: Secondary | ICD-10-CM

## 2018-12-01 MED ORDER — BUPIVACAINE (PF) 0.5 % (5 MG/ML) IJ SOLN
4 mL | Freq: Once | INTRAMUSCULAR | 0 refills | Status: CP | PRN
Start: 2018-12-01 — End: ?
  Administered 2018-12-01: 22:00:00 4 mL via INTRAMUSCULAR

## 2018-12-01 MED ORDER — LIDOCAINE (PF) 20 MG/ML (2 %) IJ SOLN
4 mL | Freq: Once | INTRAMUSCULAR | 0 refills | Status: CP | PRN
Start: 2018-12-01 — End: ?
  Administered 2018-12-01: 22:00:00 4 mL via INTRAMUSCULAR

## 2018-12-01 NOTE — Procedures
Attending Surgeon: Evelina Bucy, MD    Anesthesia: Local    Pre-Procedure Diagnosis:   1. Chronic intractable headache, unspecified headache type    2. Neuropathic pain        Post-Procedure Diagnosis:   1. Chronic intractable headache, unspecified headache type    2. Neuropathic pain        Nerve Block  Nerve: sphenopalantine ganglion  Laterality: bilateral      Consent:   Consent obtained: verbal  Consent given by: patient  Alternatives discussed: referral, no treatment, delayed treatment and alternative treatment  Discussed with patient the purpose of the treatment/procedure, other ways of treating my condition, including no treatment/ procedure and the risks and benefits of the alternatives. Patient has decided to proceed with treatment/procedure.        Universal Protocol:  Relevant documents: relevant documents present and verified  Site marked: the operative site was marked  Patient identity confirmed: Patient identify confirmed verbally with patient.        Time out: Immediately prior to procedure a time out was called to verify the correct patient, procedure, equipment, support staff and site/side marked as required        Procedures Details:   Indications: Neuritis and Pain Relief  Preparation: Patient was prepped and draped in the usual sterile fashion.  Prep: 2% chlorhexidine  Patient position: supine  Guidance: anatomical  Medications administered: 4 mL bupivacaine PF 0.5 %; 4 mL lidocaine PF 20 mg/mL (2 %)  Outcome: Pain improved  Patient tolerance: tolerated well, no immediate complications  Comments:     PROCEDURE: bilateral Sphenopalatine Ganglion Block    PREPROCEDURE DIAGNOSES: Headache, Migraine    POSTPROCEDURE DIAGNOSES: Same    SURGEON: Evelina Bucy, MD    MEDICATION: 1 mL of 0.5% bupivacaine each side    LOCAL ANESTHETIC: 2% lidocaine    SEDATION: None.    ESTIMATED BLOOD LOSS: None.    SPECIMENS REMOVED: None    COMPLICATIONS: None. TECHNIQUE: A time-out was taken to identify the correct patient, procedure, and site prior to starting the procedure. Lying in a supine position with the back slightly elevated and the head in a sniffing position, an 18ga-IV catheter with the needle removed was used to spray 2% lidocaine into the nasal passage bilaterally with a total of 1.5 mL per side. A sterile cotton-tipped applicator with an 18ga IV catheter inserted in the distal hollow tip, was soaked in 0.5% bupivacaine and then gently inserted into the nares and advanced along the superior border of the middle turbinate until it came to rest against the posterior nasopharynx. A second applicator was placed in similar fashion on the other side.    The applicators were left in place for 20 minutes. A syringe with 0.5% bupivacaine was then attached to the IV catheter inserted into the tip of each applicator and 0.38mL of 0.5% bupivacaine was injected.    The applicators were then removed after another 10 minutes had elapsed.     The procedure was completed without complications and was tolerated well. The patient was monitored after the procedure. The patient was given postprocedure and discharge instructions to follow at home. The patient was discharged in stable condition.           Estimated blood loss: none or minimal  Specimens: none  Patient tolerated the procedure well with no immediate complications. Pressure was applied, and hemostasis was accomplished.

## 2018-12-08 ENCOUNTER — Ambulatory Visit: Admit: 2018-12-08 | Discharge: 2018-12-08 | Payer: MEDICARE

## 2018-12-08 ENCOUNTER — Encounter: Admit: 2018-12-08 | Discharge: 2018-12-08 | Payer: MEDICARE

## 2018-12-08 DIAGNOSIS — G43909 Migraine, unspecified, not intractable, without status migrainosus: Secondary | ICD-10-CM

## 2018-12-08 DIAGNOSIS — R519 Chronic intractable headache, unspecified headache type: Secondary | ICD-10-CM

## 2018-12-08 DIAGNOSIS — M792 Neuralgia and neuritis, unspecified: Secondary | ICD-10-CM

## 2018-12-08 MED ORDER — LIDOCAINE (PF) 20 MG/ML (2 %) IJ SOLN
4 mL | Freq: Once | INTRAMUSCULAR | 0 refills | Status: CP | PRN
Start: 2018-12-08 — End: ?
  Administered 2018-12-08: 14:00:00 4 mL via INTRAMUSCULAR

## 2018-12-08 MED ORDER — BUPIVACAINE (PF) 0.5 % (5 MG/ML) IJ SOLN
4 mL | Freq: Once | INTRAMUSCULAR | 0 refills | Status: CP | PRN
Start: 2018-12-08 — End: ?
  Administered 2018-12-08: 14:00:00 4 mL via INTRAMUSCULAR

## 2018-12-08 NOTE — Patient Instructions
It was nice to see you today. Thank you for choosing to visit our clinic.       Your time is important and if you had to wait today, we do apologize. Our goal is to run exactly on time; however, on occasion, we get behind in clinic due to unexpected patient issues. Thank you for your patience.    General Instructions:   How to reach me: Please send a MyChart message to the [facility] or leave a voicemail for my nurse, Melissa, at [phone]   How to get a medication refill: Please use the MyChart Refill request or contact your pharmacy directly to request medication refills. We do not do same day refills on controlled substances.   How to receive your test results: If you have signed up for MyChart, you will receive your test results and messages from me this way. Otherwise, you will get a phone call or letter. If you are expecting results and have not heard from my office within 2 weeks of your testing, please send a MyChart message or call my office.   Scheduling: Our scheduling phone number is [phone].   Appointment Reminders on your cell phone: Make sure we have your cell phone number, and Text [place] to [phone].   Support for many chronic illnesses is available through [facility]: [facility] or [phone].   For questions on nights, weekends or holidays, call the Operator at [phone], and ask for the doctor on call for Anesthesia Pain.      Again, thank you for coming in today.    _________________________________________________________________________________________________________

## 2018-12-08 NOTE — Procedures
Attending Surgeon: Evelina Bucy, MD    Anesthesia: Local    Pre-Procedure Diagnosis:   1. Chronic intractable headache, unspecified headache type    2. Neuropathic pain        Post-Procedure Diagnosis:   1. Chronic intractable headache, unspecified headache type    2. Neuropathic pain        Nerve Block  Nerve: sphenopalantine ganglion  Laterality: bilateral      Consent:   Consent obtained: verbal  Consent given by: patient  Alternatives discussed: referral, no treatment, delayed treatment and alternative treatment  Discussed with patient the purpose of the treatment/procedure, other ways of treating my condition, including no treatment/ procedure and the risks and benefits of the alternatives. Patient has decided to proceed with treatment/procedure.        Universal Protocol:  Relevant documents: relevant documents present and verified  Site marked: the operative site was marked  Patient identity confirmed: Patient identify confirmed verbally with patient.        Time out: Immediately prior to procedure a time out was called to verify the correct patient, procedure, equipment, support staff and site/side marked as required        Procedures Details:   Indications: Neuritis and Pain Relief  Preparation: Patient was prepped and draped in the usual sterile fashion.  Prep: 2% chlorhexidine  Patient position: supine  Guidance: anatomical  Medications administered: 4 mL bupivacaine PF 0.5 %; 4 mL lidocaine PF 20 mg/mL (2 %)  Outcome: Pain improved  Patient tolerance: tolerated well, no immediate complications  Comments:     PROCEDURE: bilateral Sphenopalatine Ganglion Block    PREPROCEDURE DIAGNOSES: Headache, Migraine    POSTPROCEDURE DIAGNOSES: Same    SURGEON: Evelina Bucy, MD    MEDICATION: 1 mL of 0.5% bupivacaine each side    LOCAL ANESTHETIC: 2% lidocaine    SEDATION: None.    ESTIMATED BLOOD LOSS: None.    SPECIMENS REMOVED: None    COMPLICATIONS: None. TECHNIQUE: A time-out was taken to identify the correct patient, procedure, and site prior to starting the procedure. Lying in a supine position with the back slightly elevated and the head in a sniffing position, an 18ga-IV catheter with the needle removed was used to spray 2% lidocaine into the nasal passage bilaterally with a total of 1.5 mL per side. A sterile cotton-tipped applicator with an 18ga IV catheter inserted in the distal hollow tip, was soaked in 0.5% bupivacaine and then gently inserted into the nares and advanced along the superior border of the middle turbinate until it came to rest against the posterior nasopharynx. A second applicator was placed in similar fashion on the other side.    The applicators were left in place for 20 minutes. A syringe with 0.5% bupivacaine was then attached to the IV catheter inserted into the tip of each applicator and 0.38mL of 0.5% bupivacaine was injected.    The applicators were then removed after another 10 minutes had elapsed.     The procedure was completed without complications and was tolerated well. The patient was monitored after the procedure. The patient was given postprocedure and discharge instructions to follow at home. The patient was discharged in stable condition.           Estimated blood loss: none or minimal  Specimens: none  Patient tolerated the procedure well with no immediate complications. Pressure was applied, and hemostasis was accomplished.

## 2018-12-15 ENCOUNTER — Encounter: Admit: 2018-12-15 | Discharge: 2018-12-15 | Payer: MEDICARE

## 2018-12-15 ENCOUNTER — Ambulatory Visit: Admit: 2018-12-15 | Discharge: 2018-12-15 | Payer: MEDICARE

## 2018-12-15 DIAGNOSIS — R519 Chronic intractable headache, unspecified headache type: Secondary | ICD-10-CM

## 2018-12-15 DIAGNOSIS — G43909 Migraine, unspecified, not intractable, without status migrainosus: Secondary | ICD-10-CM

## 2018-12-15 DIAGNOSIS — M792 Neuralgia and neuritis, unspecified: Secondary | ICD-10-CM

## 2018-12-15 MED ORDER — BUPIVACAINE (PF) 0.5 % (5 MG/ML) IJ SOLN
4 mL | Freq: Once | INTRAMUSCULAR | 0 refills | Status: CP | PRN
Start: 2018-12-15 — End: ?
  Administered 2018-12-15: 23:00:00 4 mL via INTRAMUSCULAR

## 2018-12-15 MED ORDER — LIDOCAINE (PF) 20 MG/ML (2 %) IJ SOLN
4 mL | Freq: Once | INTRAMUSCULAR | 0 refills | Status: CP | PRN
Start: 2018-12-15 — End: ?
  Administered 2018-12-15: 23:00:00 4 mL via INTRAMUSCULAR

## 2018-12-15 NOTE — Procedures
Attending Surgeon: Evelina Bucy, MD    Anesthesia: Local    Pre-Procedure Diagnosis:   1. Chronic intractable headache, unspecified headache type    2. Neuropathic pain        Post-Procedure Diagnosis:   1. Chronic intractable headache, unspecified headache type    2. Neuropathic pain        Nerve Block  Nerve: sphenopalantine ganglion  Laterality: bilateral      Consent:   Consent obtained: verbal  Consent given by: patient  Alternatives discussed: referral, no treatment, delayed treatment and alternative treatment  Discussed with patient the purpose of the treatment/procedure, other ways of treating my condition, including no treatment/ procedure and the risks and benefits of the alternatives. Patient has decided to proceed with treatment/procedure.        Universal Protocol:  Relevant documents: relevant documents present and verified  Site marked: the operative site was marked  Patient identity confirmed: Patient identify confirmed verbally with patient.        Time out: Immediately prior to procedure a time out was called to verify the correct patient, procedure, equipment, support staff and site/side marked as required        Procedures Details:   Indications: Neuritis and Pain Relief  Preparation: Patient was prepped and draped in the usual sterile fashion.  Prep: 2% chlorhexidine  Patient position: supine  Guidance: anatomical  Medications administered: 4 mL bupivacaine PF 0.5 %; 4 mL lidocaine PF 20 mg/mL (2 %)  Outcome: Pain improved  Patient tolerance: tolerated well, no immediate complications  Comments:     PROCEDURE: bilateral Sphenopalatine Ganglion Block    PREPROCEDURE DIAGNOSES: Headache, Migraine    POSTPROCEDURE DIAGNOSES: Same    SURGEON: Evelina Bucy, MD    MEDICATION: 1 mL of 0.5% bupivacaine each side    LOCAL ANESTHETIC: 2% lidocaine    SEDATION: None.    ESTIMATED BLOOD LOSS: None.    SPECIMENS REMOVED: None    COMPLICATIONS: None. TECHNIQUE: A time-out was taken to identify the correct patient, procedure, and site prior to starting the procedure. Lying in a supine position with the back slightly elevated and the head in a sniffing position, an 18ga-IV catheter with the needle removed was used to spray 2% lidocaine into the nasal passage bilaterally with a total of 1.5 mL per side. A sterile cotton-tipped applicator with an 18ga IV catheter inserted in the distal hollow tip, was soaked in 0.5% bupivacaine and then gently inserted into the nares and advanced along the superior border of the middle turbinate until it came to rest against the posterior nasopharynx. A second applicator was placed in similar fashion on the other side.    The applicators were left in place for 20 minutes. A syringe with 0.5% bupivacaine was then attached to the IV catheter inserted into the tip of each applicator and 0.38mL of 0.5% bupivacaine was injected.    The applicators were then removed after another 10 minutes had elapsed.     The procedure was completed without complications and was tolerated well. The patient was monitored after the procedure. The patient was given postprocedure and discharge instructions to follow at home. The patient was discharged in stable condition.           Estimated blood loss: none or minimal  Specimens: none  Patient tolerated the procedure well with no immediate complications. Pressure was applied, and hemostasis was accomplished.

## 2018-12-18 ENCOUNTER — Ambulatory Visit: Admit: 2018-12-18 | Discharge: 2018-12-19 | Payer: MEDICARE

## 2018-12-18 ENCOUNTER — Encounter: Admit: 2018-12-18 | Discharge: 2018-12-18 | Payer: MEDICARE

## 2018-12-18 DIAGNOSIS — R519 Generalized headaches: Secondary | ICD-10-CM

## 2018-12-18 DIAGNOSIS — G43909 Migraine, unspecified, not intractable, without status migrainosus: Secondary | ICD-10-CM

## 2018-12-18 DIAGNOSIS — H401233 Low-tension glaucoma, bilateral, severe stage: Secondary | ICD-10-CM

## 2018-12-18 NOTE — Progress Notes
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 Tivis Ringer for CPAP     IOP improved on latanoprost QHS OU - continue drops    Recheck IOP, visual field in 4 months           Return in about 4 months (around 04/20/2019) for Glaucoma, Ophth Visual Field Return.    Corky Sox, MD  [facility]      HPI:  Patient presents with:  Eye Problem: 8 week FUV for Glaucoma; Pt states that she has noticed any difference, she continues to use Xalatan QHS; Pt denies eye pain    Pt has not concerns    Exam:  Base Eye Exam     Visual Acuity (Snellen - Linear)       Right Left    Dist cc 20/25 -2 20/25 -2    Dist ph cc NI           Tonometry (Tonopen, 9:15 AM)       Right Left    Pressure 14 12          Pupils       Dark Shape React APD    Right 4 Round 3 2+    Left 4 Round 3 0          Visual Fields (Counting fingers)       Left Right     Full Full          Extraocular Movement       Right Left     Full, Ortho Full, Ortho          Neuro/Psych     Oriented x3:  Yes    Mood/Affect:  Normal            Slit Lamp and Fundus Exam     Slit Lamp Exam       Right Left    Lids/Lashes Normal Normal    Conjunctiva/Sclera White and quiet White and quiet    Cornea Clear Clear    Anterior Chamber Deep and quiet Deep and quiet    Iris Flat Flat    Lens 1+ NS 1+ NS          Fundus Exam       Right Left    Vitreous Normal Normal    Disc notching, no pallor Sharp, healthy rim    C/D Ratio 0.7 0.2            Refraction     Wearing Rx       Sphere Cylinder Axis Add    Right -4.00 +1.75 088 +2.50 Left -4.25 +1.25 095 +2.50    Type:  PAL

## 2018-12-19 ENCOUNTER — Encounter: Admit: 2018-12-19 | Discharge: 2018-12-19 | Payer: MEDICARE

## 2018-12-19 NOTE — Progress Notes
My chart message to patient regarding sleep study results. Direct nurse contact information provided to discuss further.

## 2018-12-27 ENCOUNTER — Encounter: Admit: 2018-12-27 | Discharge: 2018-12-27 | Payer: MEDICARE

## 2018-12-27 DIAGNOSIS — G4733 Obstructive sleep apnea (adult) (pediatric): Secondary | ICD-10-CM

## 2019-01-03 ENCOUNTER — Encounter: Admit: 2019-01-03 | Discharge: 2019-01-03 | Payer: MEDICARE

## 2019-01-26 ENCOUNTER — Encounter: Admit: 2019-01-26 | Discharge: 2019-01-26 | Payer: MEDICARE

## 2019-01-26 DIAGNOSIS — M5412 Radiculopathy, cervical region: Secondary | ICD-10-CM

## 2019-01-29 ENCOUNTER — Ambulatory Visit: Admit: 2019-01-29 | Discharge: 2019-01-29 | Payer: MEDICARE

## 2019-01-29 ENCOUNTER — Encounter: Admit: 2019-01-29 | Discharge: 2019-01-29 | Payer: MEDICARE

## 2019-01-29 DIAGNOSIS — M5030 Other cervical disc degeneration, unspecified cervical region: Secondary | ICD-10-CM

## 2019-01-29 DIAGNOSIS — M7918 Myalgia, other site: Secondary | ICD-10-CM

## 2019-01-29 DIAGNOSIS — M5412 Radiculopathy, cervical region: Secondary | ICD-10-CM

## 2019-01-29 DIAGNOSIS — G43909 Migraine, unspecified, not intractable, without status migrainosus: Secondary | ICD-10-CM

## 2019-01-29 DIAGNOSIS — R519 Generalized headaches: Secondary | ICD-10-CM

## 2019-01-29 MED ORDER — LIDOCAINE (PF) 10 MG/ML (1 %) IJ SOLN
2 mL | Freq: Once | INTRAMUSCULAR | 0 refills | Status: DC
Start: 2019-01-29 — End: 2019-01-30

## 2019-01-29 MED ORDER — IOHEXOL 300 MG IODINE/ML IV SOLN
2 mL | Freq: Once | 0 refills | Status: DC
Start: 2019-01-29 — End: 2019-01-30

## 2019-01-29 MED ORDER — TRIAMCINOLONE ACETONIDE 40 MG/ML IJ SUSP
80 mg | Freq: Once | EPIDURAL | 0 refills | Status: CP
Start: 2019-01-29 — End: ?
  Administered 2019-01-29: 20:00:00 80 mg via EPIDURAL

## 2019-01-29 MED ORDER — IOHEXOL 240 MG IODINE/ML IV SOLN
2.5 mL | Freq: Once | EPIDURAL | 0 refills | Status: CP
Start: 2019-01-29 — End: ?
  Administered 2019-01-29: 20:00:00 2.5 mL via EPIDURAL

## 2019-01-29 NOTE — Patient Instructions
Procedure Completed Today: Cervical Epidural Steroid Injection    Important information following your procedure today: You may drive today    1. Pain relief may not be immediate. It is possible you may even experience an increase in pain during the first 24-48 hours followed by a gradual decrease of your pain.  2. Though the procedure is generally safe and complications are rare, we do ask that you be aware of any of the following:   ? Any swelling, persistent redness, new bleeding, or drainage from the site of the injection.  ? You should not experience a severe headache.  ? You should not run a fever over 101? F.  ? New onset of sharp, severe back & or neck pain.  ? New onset of upper or lower extremity numbness or weakness.  ? New difficulty controlling bowel or bladder function after the injection.  ? New shortness of breath.    If any of these occur, please call to report this occurrence to a nurse at [phone]. If you are calling after 4:00 p.m., on weekends or holidays please call [phone] and ask to have the resident physician on call for the physician paged or go to your local emergency room.  3. You may experience soreness at the injection site. Ice can be applied at 20 minute intervals. Avoid application of direct heat, hot showers or hot tubs today.  4. Avoid strenuous activity today. You may resume your regular activities and exercise tomorrow.  5. Patients with diabetes may see an elevation in blood sugars for 7-10 days after the injection. It is important to pay close attention to your diet, check your blood sugars daily and report extreme elevations to the physician that treats your diabetes.  6. Patients taking a daily blood thinner can resume their regular dose this evening. 7. It is important that you take all medications ordered by your pain physician. Taking medication as ordered is an important part of your pain care plan. If you cannot continue the medication plan, please notify the physician.     Possible side effects to steroids that may occur:  ? Flushing or redness of the face  ? Irritability  ? Fluid retention  ? Change in women?s menses    The following medications were used: Lidocaine , Triamcinolone   and Contrast Dye

## 2019-01-29 NOTE — Progress Notes
SPINE CENTER  INTERVENTIONAL PAIN PROCEDURE HISTORY AND PHYSICAL    No chief complaint on file.  Pain    HISTORY OF PRESENT ILLNESS:  Neck pain, radicular pain. +myalgia    Medical History:   Diagnosis Date   ? Generalized headaches    ? Migraines        Surgical History:   Procedure Laterality Date   ? ANKLE SURGERY     ? HEART CATHETERIZATION     ? TUBAL LIGATION         family history includes Arthritis in her mother; Cancer in her father; Cataract in her mother; Glaucoma in her mother.    Social History     Socioeconomic History   ? Marital status: Married     Spouse name: Not on file   ? Number of children: Not on file   ? Years of education: Not on file   ? Highest education level: Some college, no degree   Occupational History   ? Not on file   Tobacco Use   ? Smoking status: Never Smoker   ? Smokeless tobacco: Never Used   Substance and Sexual Activity   ? Alcohol use: Not Currently   ? Drug use: Never   ? Sexual activity: Not on file   Other Topics Concern   ? Not on file   Social History Narrative   ? Not on file       Allergies   Allergen Reactions   ? Isometheptene CHEST TIGHTNESS, CHILLS and MUSCLE PAIN   ? Sulfa (Sulfonamide Antibiotics) UNKNOWN       Vitals:    01/29/19 1324   BP: (!) 146/80   Patient Position: Sitting   Pulse: 72   Resp: 18   Temp: 36.8 ?C (98.2 ?F)   SpO2: 99%   Weight: 48.1 kg (106 lb)   Height: 162.6 cm (64)   PainSc: Three       REVIEW OF SYSTEMS: 10 point ROS obtained and negative      PHYSICAL EXAM:    General: The patient is a well-developed, well nourished 67 y.o. female in no acute distress.   HEENT: Head is normocephalic and atraumatic.   Pulmonary: The patient has unlabored respirations and bilateral symmetric chest excursion.   Abdomen: Soft, nontender, and nondistended.   Extremities: No clubbing, cyanosis, or edema     Neurologic:   The patient is alert and oriented times 3.   Cranial nerves II through XII are intact without any focal deficits. Moderate TTP to upper trapezius and levator scapulae with increased muscle tone bilaterally, R>L.    Musculoskeletal:     C-Spine    There is moderate paraspinal tenderness. Paraspinal muscle tone is increased.        IMPRESSION:    1. Cervical radiculopathy    2. Myalgia, other site    3. DDD (degenerative disc disease), cervical         PLAN: Other CESI and TPI neck/upper back

## 2019-01-29 NOTE — Procedures
Attending Surgeon: Evelina Bucy, MD    Anesthesia: Local    Pre-Procedure Diagnosis:   1. Cervical radiculopathy    2. Myalgia, other site    3. DDD (degenerative disc disease), cervical        Post-Procedure Diagnosis:   1. Cervical radiculopathy    2. Myalgia, other site    3. DDD (degenerative disc disease), cervical        ESI CRV/THRC  Procedure: epidural - interlaminar    Laterality: n/a    Location: cervical ESI with imaging - C7-T1      Consent:   Consent obtained: written  Consent given by: patient  Risks discussed: allergic reaction, bleeding, bruising, infection, never damage, no change or worsening in pain, pneumo thorax, reaction to medication, seizure, swelling and weakness  Alternatives discussed: alternative treatment, delayed treatment and no treatment  Discussed with patient the purpose of the treatment/procedure, other ways of treating my condition, including no treatment/ procedure and the risks and benefits of the alternatives. Patient has decided to proceed with treatment/procedure.        Universal Protocol:  Relevant documents: relevant documents present and verified  Site marked: the operative site was marked  Patient identity confirmed: Patient identify confirmed verbally with patient.        Time out: Immediately prior to procedure a time out was called to verify the correct patient, procedure, equipment, support staff and site/side marked as required      Procedures Details:   Indications: pain   Prep: chlorhexidine  Patient position: prone  Estimated Blood Loss: minimal  Specimens: none  Amount Injected:   C7-T1: 4mL    Number of Levels: 1  Approach: midline  Guidance: fluoroscopy  Contrast: Procedure confirmed with contrast under live fluoroscopy.  Needle and Epidural Catheter: tuohy  Needle size: 18 G  Injection procedure: Incremental injection and Negative aspiration for blood Patient tolerance: Patient tolerated the procedure well with no immediate complications. Pressure was applied, and hemostasis was accomplished.  Outcome: Pain unchanged  Comments:   CERVICAL INTERLAMINAR EPIDURAL STEROID INJECTION    PROCEDURE:  1) C7-T1 interlaminar epidural steroid injection  2) Fluoroscopic needle guidance    REASON FOR PROCEDURE: Cervical radiculopathy, DDD (cervical)    ATTENDING PHYSICIAN: Evelina Bucy, MD    MEDICATIONS INJECTED: 2 mL of triamciniolone (80 mg) and 2 mL of sterile, preservative-free normal saline    LOCAL ANESTHETIC INJECTED: 1 mL of 1% lidocaine    SEDATION MEDICATIONS: None    ESTIMATED BLOOD LOSS: None    SPECIMENS REMOVED: None    COMPLICATIONS: None    TECHNIQUE: Time-out was taken to identify the correct patient, procedure and side prior to starting the procedure. With the patient lying in a prone position with the neck in a slightly  flexed position, the area was prepped and draped in sterile fashion using DuraPrep and a fenestrated drape. The area was determined under fluoroscopic guidance. A 27-gauge, 1.25-inch  needle was used to anesthetize the needle entry site and subcutaneous tissues.     The 18-gauge, 3.5-inch Tuohy needle was advanced through the ligamentum flavum using loss of resistance technique. Once the tip of the needle was thought to be in the desired position, contrast was injected to confirm only epidural spread and no vascular runoff via A-P and lateral  views. The injectate was then injected slowly with intermittent negative aspiration.    The procedure was completed without complications and was tolerated well. The patient was monitored after the procedure.  The patient (or responsible party) was given post-procedure and  discharge instructions to follow at home. The patient was discharged in stable condition.    Evelina Bucy, MD, MBA      Trigger Point Injection Locations: L cervical, R cervical, L upper trapezius, R upper trapezius, L levator scapulae and R levator scapulae  Consent:   Consent obtained: written  Consent given by: patient  Alternatives discussed: alternative treatment, delayed treatment and no treatment  Discussed with patient the purpose of the treatment/procedure, other ways of treating my condition, including no treatment/ procedure and the risks and benefits of the alternatives. Patient has decided to proceed with treatment/procedure.        Universal Protocol:  Relevant documents: relevant documents present and verified  Site marked: the operative site was marked  Patient identity confirmed: Patient identify confirmed verbally with patient.        Time out: Immediately prior to procedure a time out was called to verify the correct patient, procedure, equipment, support staff and site/side marked as required      Procedures Details:   Indications: muscle spasm and myalgiaPrep: 2% chlorhexidine  Needle size: 27 G  Number of muscles: 3 or more  Approach: posterior  Patient tolerance: Patient tolerated the procedure well with no immediate complications. Pressure was applied, and hemostasis was accomplished.  Comments:   PROCEDURE: Trigger Point Injections    Pre-Procedure Diagnoses:  1. Myalgia, other  2. Myofascial pain  3. Muscle spasm    Post-Procedure Diagnoses:  Same    Physician: Evelina Bucy, MD    ANESTHESIA: Lidocaine 1%    COMPLICATIONS: None.    Location(s) of pain: As noted above  Presence of point tenderness in a tight band of muscle in above area: Yes  Restricted range of motion: Yes  Conservative therapy attempted for at least 6 weeks: activity modification, pharmacotherapy  Other components of comprehensive management: pharmacotherapy, activity modification DESCRIPTION OF PROCEDURE: The procedure risks, hazards and alternatives were discussed with the patient and a proper consent was obtained. The area over each trigger point was prepped with alcohol utilizing sterile technique. After isolating it between two palpating fingertips a 27-gauge 1.5 needle was placed in the center of the myofascial spasms and a negative aspiration was performed. Then 0.5cc of the solution above was injected into each trigger point.     Trigger points in each of the muscle groups noted above were injected. A total of 5 ml of the local anesthetic solution was utilized.    The patient tolerated the procedure well without any apparent difficulties or complications.          Estimated blood loss: none or minimal  Specimens: none  Patient tolerated the procedure well with no immediate complications. Pressure was applied, and hemostasis was accomplished.

## 2019-01-29 NOTE — Progress Notes

## 2019-03-05 ENCOUNTER — Encounter: Admit: 2019-03-05 | Discharge: 2019-03-05 | Payer: MEDICARE

## 2019-03-05 ENCOUNTER — Ambulatory Visit: Admit: 2019-03-05 | Discharge: 2019-03-06 | Payer: MEDICARE

## 2019-03-05 DIAGNOSIS — M5412 Radiculopathy, cervical region: Secondary | ICD-10-CM

## 2019-03-05 DIAGNOSIS — M5030 Other cervical disc degeneration, unspecified cervical region: Secondary | ICD-10-CM

## 2019-03-05 DIAGNOSIS — M7918 Myalgia, other site: Secondary | ICD-10-CM

## 2019-03-05 MED ORDER — GABAPENTIN 300 MG PO CAP
900 mg | ORAL_CAPSULE | Freq: Three times a day (TID) | ORAL | 5 refills | Status: AC
Start: 2019-03-05 — End: ?

## 2019-03-05 NOTE — Progress Notes
Obtained patient's verbal consent to treat them and their agreement to [facility] financial policy and NPP via this telehealth visit during the [facility] Emergency    [facility] - Interventional Pain      Subjective     Chief Complaint: neck/head pain    Interval HPI: Rita Gibson is a 69 y.o. female who  has a past medical history of Generalized headaches and Migraines. who now presents for evaluation.    Patient presents to clinic for evaluation of low neck, accompanied by patient's spouse Rita Gibson.  ?  Patient reports about 90% relief after CESI and cervical TPI performed on 01/29/2019. Patient states her pain is overall improved by the injections, including neck and head pain. Patient states that her pain is now less severe and more intermittent.     Patient unsure of level of relief from SPG blocks performed in 11/2018, yet her pain has significantly improved over the past few months.     Patient reports her pain is relatively unchanged since last office visit.   ?  The pain is in the low neck region, will radiate to the base of the skull to the right side of the top to the head to the middle forehead and back of the eyes bilaterally.   Will also occasional pain that radiates to bilateral shoulders, R>L  Pain is intermittent  Numbness/tingling: none  The pain ranges 1-5/10 (average 3/10)  The pain is described as dull, sore, needles, stabbing, sharp   The pain is exacerbated by household chores, lifting arms above shoulder level  The pain is partially alleviated by rest, heating pad, prescribed medications  +muscle stiffness/tightness  +sleep disturbance (yet improved)  Denies strength loss in BUE  ?  Patient reports currently taking Gabapentin 900mg  TID and Baclofen BID as prescribed by PCP. Patient reports mild relief of pain with medication use, denies any SEs from medication use.  ?  Patient reports recent dx of Glaucoma, told of 90% loss in the right eye.   ? Hx of spine surgery: none  ?  Patient denies fevers, chills, infection, bleeding issues, anticoagulants, new muscle weakness, numbness, tingling, bowel/bladder incontinence, or saddle anesthesia.       ROS: All 14 systems reviewed and found to be negative except as above and as follows.  Review of Systems   Musculoskeletal: Positive for back pain.   Neurological: Positive for dizziness and headaches.   Hematological: Bruises/bleeds easily.   Psychiatric/Behavioral: Positive for sleep disturbance.   All other systems reviewed and are negative.           PRIOR MEDICATIONS:   Effective  Gabapentin  Baclofen      Ineffective  NSAID    Unable to tolerate  Nortriptyline    Never  Lyrica  Cymbalta  Tizanidine      PRIOR INTERVENTIONS:  No spine surgery   Effective  CESI  TPI  Supraorbital nerve blocks    Ineffective  Physician-directed PT  Exercise        Rita Gibson denies any recent fevers, chills, infection, antibiotics, bowel or bladder incontinence, saddle anesthesia, bleeding issues, or recent anticoagulant.       Past Medical History:  Medical History:   Diagnosis Date   ? Generalized headaches    ? Migraines        Family History:  Family History   Problem Relation Age of Onset   ? Arthritis Mother    ? Cataract Mother    ?  Glaucoma Mother    ? Cancer Father    ? Amblyopia Neg Hx    ? Blindness Neg Hx    ? Strabismus Neg Hx    ? Retinal Detachment Neg Hx    ? Macular Degen Neg Hx        [place]   Social History     Socioeconomic History   ? Marital status: Married     Spouse name: Not on file   ? Number of children: Not on file   ? Years of education: Not on file   ? Highest education level: Some college, no degree   Occupational History   ? Not on file   Tobacco Use   ? Smoking status: Never Smoker   ? Smokeless tobacco: Never Used   Substance and Sexual Activity   ? Alcohol use: Not Currently   ? Drug use: Never   ? Sexual activity: Not on file   Other Topics Concern ? Not on file   Social History Narrative   ? Not on file       Allergies:  Allergies   Allergen Reactions   ? Isometheptene CHEST TIGHTNESS, CHILLS and MUSCLE PAIN   ? Sulfa (Sulfonamide Antibiotics) UNKNOWN       Medications:  Current Outpatient Medications on File Prior to Visit   Medication Sig Dispense Refill   ? alendronate (FOSAMAX) 70 mg tablet      ? baclofen (LIORESAL) 10 mg tablet Take 10 mg by mouth twice daily.     ? buPROPion SR(+) (WELLBUTRIN-SR) 150 mg tablet Take 150 mg by mouth twice daily.     ? calcium carbonate (CALCIUM 500 PO) Take  by mouth.     ? cholecalciferol (VITAMIN D-3) 1,000 units tablet Take 1,000 Units by mouth daily.     ? clonazePAM (KLONOPIN) 1 mg tablet Take 1 mg by mouth twice daily.     ? KETOROLAC TROMETHAMINE (KETOROLAC IJ) Inject  to area(s) as directed.     ? latanoprost (XALATAN) 0.005 % ophthalmic solution Apply one drop to both eyes at bedtime daily. 2.5 mL 11   ? omeprazole DR(+) (PRILOSEC) 40 mg capsule Take 40 mg by mouth daily before breakfast.     ? rosuvastatin (CRESTOR) 5 mg tablet Take 5 mg by mouth at bedtime daily.       No current facility-administered medications on file prior to visit.        Physical examination:   There were no vitals taken for this visit.       Female Opioid Risk Score: 0 (08/09/2018  8:42 AM)  Opioid Risk Category: Low Risk (08/09/2018  8:42 AM)    Exam performed by instructing patient to perform various maneuvers and provide feedback while I personally visualized the patient exam.     General: The patient is a well-developed, well nourished 69 y.o. female in no acute distress.   HEENT: Head is normocephalic and atraumatic. +tinted glasses  Pulmonary: The patient has unlabored respirations and bilateral symmetric chest excursion.   Abdomen: Soft, nontender, and nondistended. There is no rebound or guarding per patient assessment.  Extremities: No clubbing, cyanosis, or edema as seen on video.    Neurologic: The patient is alert and oriented times 3.    There is no sensory deficit to light touch or pinprick in the affected areas. There is no allodynia noted.    +mild pain with palpation at the base of the occiput  bilaterally, R>L, no radiating pain noted    Mild-moderate TTP to upper trapezius and levator scapulae with increased muscle tone bilaterally, R>L.    Musculoskeletal:   Gait is normal.    C-Spine    There is mild paraspinal tenderness. Paraspinal muscle tone is increased.  ROM with flexion, extension, rotation, and lateral bending is intact but stiff and tight.   Patient believes strength is equal and adequate bilaterally in the flexors and extensors of the bilateral upper extremities.          MRI C-Spine Results:  11/2015  C2-3 mild disc desiccation  C3-4 mod loss of disc height  C4-5 mild disc dessication. Left paracentral disc protrusion. mild R FA.   C5-6 +FA. Broad DOC. Left foraminal and paracentral disc protrusion. Miild-mod L NFS. Mild R NFS. Some effacement of thecal sac.  C6-7 DDD.   Spondylosis most pronounced at C5-6.       Last Cr and LFT's:  Creatinine   Date Value Ref Range Status   08/09/2018 0.90 0.4 - 1.00 MG/DL Final     AST (SGOT)   Date Value Ref Range Status   08/09/2018 20 7 - 40 U/L Final     ALT (SGPT)   Date Value Ref Range Status   08/09/2018 20 7 - 56 U/L Final     Alk Phosphatase   Date Value Ref Range Status   08/09/2018 104 25 - 110 U/L Final     Total Bilirubin   Date Value Ref Range Status   08/09/2018 0.4 0.3 - 1.2 MG/DL Final          Assessment:    Arthena Boetcher is a 69 y.o. female who  has a past medical history of Generalized headaches and Migraines. who presents for evaluation of neck pain.     The pain complaints are most likely due to:    1. Cervical radiculopathy  [place] AMB SPINE INJECT INTERLAM CRV/THRC   2. DDD (degenerative disc disease), cervical  [place] [facility] SPINE INJECT INTERLAM CRV/THRC   3. Myalgia, other site  [place] [facility] SPINE PROC TRIGGER POINT INJECTION Patient has had an adequate trial of > 3 months of rest, exercise, NSAID's, multimodal treatment, and the passage of time without improvement of symptoms. The pain has significant impact on the daily quality of life.     Plan:    1. Discussed plan of care options with patient. Will schedule for  repeat CESI and TPI in 2 months.  3. Will continue Gabapentin as prescribed. Discussed risks and benefits of medication therapy and possible SEs, patient verbalized understanding and all questions answered.   4. May consider facet-directed therapies in future.  5. May still consider supraorbital nerve blocks in future.  6. Follow-up after procedure.     Risks/benefits of all pharmacologic and interventional treatments discussed and questions answered.     Todays visit took place via face-to-face encounter utilizing Zoom application. Visit Start Time 0817 Visit End Time [phone].

## 2019-03-30 ENCOUNTER — Encounter: Admit: 2019-03-30 | Discharge: 2019-03-30 | Payer: MEDICARE

## 2019-03-30 DIAGNOSIS — R69 Illness, unspecified: Secondary | ICD-10-CM

## 2019-04-06 ENCOUNTER — Encounter: Admit: 2019-04-06 | Discharge: 2019-04-06 | Payer: MEDICARE

## 2019-04-06 ENCOUNTER — Ambulatory Visit: Admit: 2019-04-06 | Discharge: 2019-04-06 | Payer: MEDICARE

## 2019-04-06 DIAGNOSIS — G43909 Migraine, unspecified, not intractable, without status migrainosus: Secondary | ICD-10-CM

## 2019-04-06 DIAGNOSIS — R519 Generalized headaches: Secondary | ICD-10-CM

## 2019-04-06 DIAGNOSIS — H401233 Low-tension glaucoma, bilateral, severe stage: Secondary | ICD-10-CM

## 2019-04-06 NOTE — Assessment & Plan Note
Latanoprost QHS OU    IOP at goal    Pt now on CPAP

## 2019-04-06 NOTE — Progress Notes
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    Rita Sox, MD  [facility]      HPI:  Patient presents with:  Eye Exam: Fu; pt states that she has to increase font on phone to read. When eyes are tired words distort more.   Treatment: latanoprost qhs ou.     Pt did get a CPAP since last visit. Is having a trouble with fit - getting new mask.      Exam:  Base Eye Exam     Visual Acuity (Snellen - Linear)       Right Left    Dist cc 20/25 -2+1 20/25 -2+3    Correction: Glasses          Tonometry (Tonopen, 9:45 AM)       Right Left    Pressure 14 13          Pupils       APD    Right +3    Left None          Visual Fields    24.2 today           Extraocular Movement       Right Left     Full Full          Neuro/Psych     Oriented x3: Yes    Mood/Affect: Normal            Slit Lamp and Fundus Exam     Slit Lamp Exam       Right Left    Lids/Lashes Normal Normal    Conjunctiva/Sclera White and quiet White and quiet    Cornea Clear Clear    Anterior Chamber Deep and quiet Deep and quiet    Iris Flat Flat    Lens 1+ NS 1+ NS          Fundus Exam       Right Left    Vitreous Normal Normal    Disc notching, no pallor Sharp, healthy rim    C/D Ratio 0.7 0.2            Refraction     Wearing Rx       Sphere Cylinder Axis Add    Right -4.00 +1.75 088 +2.50    Left -4.25 +1.25 095 +2.50    Type: PAL

## 2019-04-10 ENCOUNTER — Encounter: Admit: 2019-04-10 | Discharge: 2019-04-10 | Payer: MEDICARE

## 2019-04-10 NOTE — Telephone Encounter
Phone call from patient stating she is trying to get supplies from DME and has been told she is past her 90 day compliance. Patient has scheduled appointment but may need to start over per Medicare guidelines. Patient states she knew about the 31-90 days but just forgot. Patient states she may call DME back and pay out of pocket for supplies.

## 2019-04-11 ENCOUNTER — Encounter: Admit: 2019-04-11 | Discharge: 2019-04-11 | Payer: MEDICARE

## 2019-04-11 DIAGNOSIS — Z1231 Encounter for screening mammogram for malignant neoplasm of breast: Secondary | ICD-10-CM

## 2019-04-19 ENCOUNTER — Encounter: Admit: 2019-04-19 | Discharge: 2019-04-19 | Payer: MEDICARE

## 2019-04-19 DIAGNOSIS — R519 Generalized headaches: Secondary | ICD-10-CM

## 2019-04-19 DIAGNOSIS — G43909 Migraine, unspecified, not intractable, without status migrainosus: Secondary | ICD-10-CM

## 2019-04-19 DIAGNOSIS — G4761 Periodic limb movement disorder: Secondary | ICD-10-CM

## 2019-04-19 DIAGNOSIS — G4733 Obstructive sleep apnea (adult) (pediatric): Secondary | ICD-10-CM

## 2019-04-19 NOTE — Assessment & Plan Note
-  Not bothersome to patient or bed partner at this time. Continue to monitor

## 2019-04-20 ENCOUNTER — Encounter: Admit: 2019-04-20 | Discharge: 2019-04-20 | Payer: MEDICARE

## 2019-04-20 DIAGNOSIS — M5412 Radiculopathy, cervical region: Secondary | ICD-10-CM

## 2019-04-23 ENCOUNTER — Encounter: Admit: 2019-04-23 | Discharge: 2019-04-23 | Payer: MEDICARE

## 2019-04-23 ENCOUNTER — Ambulatory Visit: Admit: 2019-04-23 | Discharge: 2019-04-23 | Payer: MEDICARE

## 2019-04-23 DIAGNOSIS — M5030 Other cervical disc degeneration, unspecified cervical region: Secondary | ICD-10-CM

## 2019-04-23 DIAGNOSIS — M7918 Myalgia, other site: Secondary | ICD-10-CM

## 2019-04-23 DIAGNOSIS — M5412 Radiculopathy, cervical region: Secondary | ICD-10-CM

## 2019-04-23 MED ORDER — TRIAMCINOLONE ACETONIDE 40 MG/ML IJ SUSP
80 mg | Freq: Once | EPIDURAL | 0 refills | Status: CP
Start: 2019-04-23 — End: ?
  Administered 2019-04-23: 19:00:00 80 mg via EPIDURAL

## 2019-04-23 MED ORDER — IOPAMIDOL 41 % IT SOLN
2.5 mL | Freq: Once | EPIDURAL | 0 refills | Status: CP
Start: 2019-04-23 — End: ?
  Administered 2019-04-23: 19:00:00 2.5 mL via EPIDURAL

## 2019-04-23 MED ORDER — IOPAMIDOL 41 % IT SOLN
2.5 mL | Freq: Once | INTRATHECAL | 0 refills | Status: DC
Start: 2019-04-23 — End: 2019-04-23

## 2019-04-23 NOTE — Progress Notes

## 2019-04-23 NOTE — Progress Notes
[facility]  INTERVENTIONAL PAIN PROCEDURE HISTORY AND PHYSICAL    Chief Complaint   Patient presents with   ? Procedure       HISTORY OF PRESENT ILLNESS:  Neck pain with radicular component. +myalgia    Medical History:   Diagnosis Date   ? Generalized headaches    ? Migraines        Surgical History:   Procedure Laterality Date   ? ANKLE SURGERY     ? HEART CATHETERIZATION     ? TUBAL LIGATION         family history includes Arthritis in her mother; Cancer in her father; Cataract in her mother; Glaucoma in her mother.    Social History     Socioeconomic History   ? Marital status: Married     Spouse name: Not on file   ? Number of children: Not on file   ? Years of education: Not on file   ? Highest education level: Some college, no degree   Occupational History   ? Not on file   Tobacco Use   ? Smoking status: Never Smoker   ? Smokeless tobacco: Never Used   Substance and Sexual Activity   ? Alcohol use: Not Currently   ? Drug use: Never   ? Sexual activity: Not on file   Other Topics Concern   ? Not on file   Social History Narrative   ? Not on file       Allergies   Allergen Reactions   ? Isometheptene CHEST TIGHTNESS, CHILLS and MUSCLE PAIN   ? Sulfa (Sulfonamide Antibiotics) UNKNOWN       Vitals:    04/23/19 1224   BP: (!) 140/100   Pulse: 72   Resp: 17   Temp: 36.7 ?C (98 ?F)   SpO2: 98%   Weight: 47.6 kg (105 lb)   Height: 162.6 cm (64)   PainSc: Two       REVIEW OF SYSTEMS: 10 point ROS obtained and negative       PHYSICAL EXAM:    General: The patient is a well-developed, well nourished 69 y.o. female in no acute distress.   HEENT: Head is normocephalic and atraumatic. +tinted glasses  Pulmonary: The patient has unlabored respirations and bilateral symmetric chest excursion.   Abdomen: Soft, nontender, and nondistended.   Extremities: No clubbing, cyanosis, or edema     Neurologic:   The patient is alert and oriented times 3.      Mild-moderate TTP to upper trapezius and levator scapulae with increased muscle tone bilaterally, R>L.    Musculoskeletal:     C-Spine    There is mild paraspinal tenderness.         IMPRESSION:    1. Cervical radiculopathy    2. Myalgia, other site    3. DDD (degenerative disc disease), cervical         PLAN: Cervical Epidural Steroid Injection C7-T1 and TPI neck/upper back

## 2019-04-23 NOTE — Procedures
Attending Surgeon: Evelina Bucy, MD    Anesthesia: Local    Pre-Procedure Diagnosis:   1. Cervical radiculopathy    2. Myalgia, other site    3. DDD (degenerative disc disease), cervical        Post-Procedure Diagnosis:   1. Cervical radiculopathy    2. Myalgia, other site    3. DDD (degenerative disc disease), cervical        ESI CRV/THRC  Procedure: epidural - interlaminar    Laterality: n/a    Location: cervical ESI with imaging - C7-T1      Consent:   Consent obtained: written  Consent given by: patient  Risks discussed: allergic reaction, bleeding, bruising, infection, never damage, no change or worsening in pain, pneumo thorax, reaction to medication, seizure, swelling and weakness  Alternatives discussed: alternative treatment, delayed treatment and no treatment  Discussed with patient the purpose of the treatment/procedure, other ways of treating my condition, including no treatment/ procedure and the risks and benefits of the alternatives. Patient has decided to proceed with treatment/procedure.        Universal Protocol:  Relevant documents: relevant documents present and verified  Site marked: the operative site was marked  Patient identity confirmed: Patient identify confirmed verbally with patient.        Time out: Immediately prior to procedure a time out was called to verify the correct patient, procedure, equipment, support staff and site/side marked as required      Procedures Details:   Indications: pain   Prep: chlorhexidine  Patient position: prone  Estimated Blood Loss: minimal  Specimens: none  Amount Injected:   C7-T1: 4mL    Number of Levels: 1  Approach: midline  Guidance: fluoroscopy  Contrast: Procedure confirmed with contrast under live fluoroscopy.  Needle and Epidural Catheter: tuohy  Needle size: 18 G  Injection procedure: Incremental injection and Negative aspiration for blood  Patient tolerance: Patient tolerated the procedure well with no immediate complications. Pressure was applied, and hemostasis was accomplished.  Outcome: Pain unchanged  Comments:   CERVICAL INTERLAMINAR EPIDURAL STEROID INJECTION    PROCEDURE:  1) C7-T1 interlaminar epidural steroid injection  2) Fluoroscopic needle guidance    REASON FOR PROCEDURE: Cervical radiculopathy, DDD (cervical)    ATTENDING PHYSICIAN: Evelina Bucy, MD    MEDICATIONS INJECTED: 2 mL of triamciniolone (80 mg) and 2 mL of sterile, preservative-free normal saline    LOCAL ANESTHETIC INJECTED: 1 mL of 1% lidocaine    SEDATION MEDICATIONS: None    ESTIMATED BLOOD LOSS: None    SPECIMENS REMOVED: None    COMPLICATIONS: None    TECHNIQUE: Time-out was taken to identify the correct patient, procedure and side prior to starting the procedure. With the patient lying in a prone position with the neck in a slightly  flexed position, the area was prepped and draped in sterile fashion using DuraPrep and a fenestrated drape. The area was determined under fluoroscopic guidance. A 27-gauge, 1.25-inch  needle was used to anesthetize the needle entry site and subcutaneous tissues.     The 18-gauge, 3.5-inch Tuohy needle was advanced through the ligamentum flavum using loss of resistance technique. Once the tip of the needle was thought to be in the desired position, contrast was injected to confirm only epidural spread and no vascular runoff via A-P and lateral  views. The injectate was then injected slowly with intermittent negative aspiration.    The procedure was completed without complications and was tolerated well. The patient was monitored after the  procedure. The patient (or responsible party) was given post-procedure and  discharge instructions to follow at home. The patient was discharged in stable condition.    Evelina Bucy, MD, MBA      Trigger Point Injection  Locations: L cervical, R cervical, R levator scapulae, L levator scapulae, L upper trapezius and R upper trapezius  Consent:   Consent obtained: written  Consent given by: patient Alternatives discussed: alternative treatment, delayed treatment and no treatment  Discussed with patient the purpose of the treatment/procedure, other ways of treating my condition, including no treatment/ procedure and the risks and benefits of the alternatives. Patient has decided to proceed with treatment/procedure.        Universal Protocol:  Relevant documents: relevant documents present and verified  Site marked: the operative site was marked  Patient identity confirmed: Patient identify confirmed verbally with patient.        Time out: Immediately prior to procedure a time out was called to verify the correct patient, procedure, equipment, support staff and site/side marked as required      Procedures Details:   Indications: muscle spasm and myalgiaPrep: 2% chlorhexidine  Needle size: 27 G  Number of muscles: 3 or more  Approach: posterior  Patient tolerance: Patient tolerated the procedure well with no immediate complications. Pressure was applied, and hemostasis was accomplished.  Comments:   PROCEDURE: Trigger Point Injections    Pre-Procedure Diagnoses:  1. Myalgia, other  2. Myofascial pain  3. Muscle spasm    Post-Procedure Diagnoses:  Same    Physician: Evelina Bucy, MD    ANESTHESIA: Lidocaine 1%    COMPLICATIONS: None.    Location(s) of pain: As noted above  Presence of point tenderness in a tight band of muscle in above area: Yes  Restricted range of motion: Yes  Conservative therapy attempted for at least 6 weeks: activity modification, pharmacotherapy  Other components of comprehensive management: pharmacotherapy, activity modification    DESCRIPTION OF PROCEDURE: The procedure risks, hazards and alternatives were discussed with the patient and a proper consent was obtained. The area over each trigger point was prepped with alcohol utilizing sterile technique. After isolating it between two palpating fingertips a 27-gauge 1.5 needle was placed in the center of the myofascial spasms and a negative aspiration was performed. Then 0.5cc of the solution above was injected into each trigger point.     Trigger points in each of the muscle groups noted above were injected. A total of 5 ml of the local anesthetic solution was utilized.    The patient tolerated the procedure well without any apparent difficulties or complications.          Estimated blood loss: none or minimal  Specimens: none  Patient tolerated the procedure well with no immediate complications. Pressure was applied, and hemostasis was accomplished.

## 2019-04-23 NOTE — Patient Instructions
Procedure Completed Today: Cervical Epidural Steroid Injection    Important information following your procedure today: You may drive today    1. Pain relief may not be immediate. It is possible you may even experience an increase in pain during the first 24-48 hours followed by a gradual decrease of your pain.  2. Though the procedure is generally safe and complications are rare, we do ask that you be aware of any of the following:   ? Any swelling, persistent redness, new bleeding, or drainage from the site of the injection.  ? You should not experience a severe headache.  ? You should not run a fever over 101? F.  ? New onset of sharp, severe back & or neck pain.  ? New onset of upper or lower extremity numbness or weakness.  ? New difficulty controlling bowel or bladder function after the injection.  ? New shortness of breath.    If any of these occur, please call to report this occurrence to a nurse at [phone]. If you are calling after 4:00 p.m., on weekends or holidays please call [phone] and ask to have the resident physician on call for the physician paged or go to your local emergency room.  3. You may experience soreness at the injection site. Ice can be applied at 20 minute intervals. Avoid application of direct heat, hot showers or hot tubs today.  4. Avoid strenuous activity today. You may resume your regular activities and exercise tomorrow.  5. Patients with diabetes may see an elevation in blood sugars for 7-10 days after the injection. It is important to pay close attention to your diet, check your blood sugars daily and report extreme elevations to the physician that treats your diabetes.  6. Patients taking a daily blood thinner can resume their regular dose this evening.  7. It is important that you take all medications ordered by your pain physician. Taking medication as ordered is an important part of your pain care plan. If you cannot continue the medication plan, please notify the physician.     Possible side effects to steroids that may occur:  ? Flushing or redness of the face  ? Irritability  ? Fluid retention  ? Change in women?s menses    The following medications were used: Triamcinolone   and Contrast Dye

## 2019-04-24 ENCOUNTER — Ambulatory Visit: Admit: 2019-04-24 | Discharge: 2019-04-24 | Payer: MEDICARE

## 2019-04-24 ENCOUNTER — Encounter: Admit: 2019-04-24 | Discharge: 2019-04-24 | Payer: MEDICARE

## 2019-04-24 DIAGNOSIS — Z1231 Encounter for screening mammogram for malignant neoplasm of breast: Secondary | ICD-10-CM

## 2019-04-24 DIAGNOSIS — R922 Inconclusive mammogram: Secondary | ICD-10-CM

## 2019-04-27 ENCOUNTER — Encounter: Admit: 2019-04-27 | Discharge: 2019-04-27 | Payer: MEDICARE

## 2019-04-27 ENCOUNTER — Ambulatory Visit: Admit: 2019-04-27 | Discharge: 2019-04-28 | Payer: MEDICARE

## 2019-04-27 DIAGNOSIS — M5412 Radiculopathy, cervical region: Secondary | ICD-10-CM

## 2019-04-27 DIAGNOSIS — G43909 Migraine, unspecified, not intractable, without status migrainosus: Secondary | ICD-10-CM

## 2019-04-27 DIAGNOSIS — R519 Generalized headaches: Secondary | ICD-10-CM

## 2019-04-27 DIAGNOSIS — M5030 Other cervical disc degeneration, unspecified cervical region: Secondary | ICD-10-CM

## 2019-04-27 DIAGNOSIS — M7918 Myalgia, other site: Secondary | ICD-10-CM

## 2019-04-27 MED ORDER — GABAPENTIN 600 MG PO TAB
1200 mg | ORAL_TABLET | ORAL | 11 refills | Status: AC
Start: 2019-04-27 — End: ?

## 2019-04-27 NOTE — Progress Notes
Obtained patient's verbal consent to treat them and their agreement to [employer] financial policy and NPP via this telehealth visit during the [facility]    [facility] - Interventional Pain      Subjective     Chief Complaint: neck/head pain    Interval HPI: Rita Gibson is a 69 y.o. female who  has a past medical history of Generalized headaches and Migraines. who now presents for evaluation.    Patient presents to clinic for evaluation of low neck.  ?  Patient reports about 90-100% relief after CESI and cervical TPI performed on 04/23/2019. Patient states her pain is overall improved by the injections, and is very happy with level of relief. Patient states she also had noted improvement of sleep quality. Patient reports the pain relief lasted about 10 weeks, then gradually returns to baseline.      Patient reports her pain is relatively unchanged since last office visit.   ?  The pain is in the low neck region, will radiate to the base of the skull to the right side of the top to the head to the middle forehead and back of the eyes bilaterally.   Will also occasional pain that radiates to bilateral shoulders, R>L  Pain is intermittent  Numbness/tingling: none  The pain ranges 1-2/10  The pain is described as dull, sore, needles, stabbing, sharp   The pain is exacerbated by household chores, lifting arms above shoulder level  The pain is partially alleviated by rest, heating pad, ice, prescribed medications  +muscle stiffness/tightness  Denies strength loss in BUE/BLE  ?  Patient reports currently taking Baclofen BID as prescribed by PCP.     Patient taking Gabapentin 900mg  as prescribed, reports mild relief of pain with medication use, denies any SEs from medication use.  ?  ?  Hx of spine surgery: none  ?  Patient denies fevers, chills, infection, bleeding issues, anticoagulants, new muscle weakness, numbness, tingling, bowel/bladder incontinence, or saddle anesthesia. ROS: All 14 systems reviewed and found to be negative except as above and as follows.  Review of Systems   Musculoskeletal: Positive for back pain.   Neurological: Positive for dizziness and headaches.   Hematological: Bruises/bleeds easily.   Psychiatric/Behavioral: Positive for sleep disturbance.   All other systems reviewed and are negative.           PRIOR MEDICATIONS:   Effective  Gabapentin  Baclofen      Ineffective  NSAID    Unable to tolerate  Nortriptyline    Never  Lyrica  Cymbalta  Tizanidine      PRIOR INTERVENTIONS:  No spine surgery   Effective  CESI  TPI  Supraorbital nerve blocks    Ineffective  Physician-directed PT  Exercise        Rita Gibson denies any recent fevers, chills, infection, antibiotics, bowel or bladder incontinence, saddle anesthesia, bleeding issues, or recent anticoagulant.       Past Medical History:  Medical History:   Diagnosis Date   ? Generalized headaches    ? Migraines        Family History:  Family History   Problem Relation Age of Onset   ? Arthritis Mother    ? Cataract Mother    ? Glaucoma Mother    ? Cancer Father    ? Amblyopia Neg Hx    ? Blindness Neg Hx    ? Strabismus Neg Hx    ? Retinal Detachment Neg  Hx    ? Macular Degen Neg Hx        Social History:  [place]   Social History     Socioeconomic History   ? Marital status: Married     Spouse name: Not on file   ? Number of children: Not on file   ? Years of education: Not on file   ? Highest education level: Some college, no degree   Occupational History   ? Not on file   Tobacco Use   ? Smoking status: Never Smoker   ? Smokeless tobacco: Never Used   Substance and Sexual Activity   ? Alcohol use: Not Currently   ? Drug use: Never   ? Sexual activity: Not on file   Other Topics Concern   ? Not on file   Social History Narrative   ? Not on file       Allergies:  Allergies   Allergen Reactions   ? Isometheptene CHEST TIGHTNESS, CHILLS and MUSCLE PAIN   ? Sulfa (Sulfonamide Antibiotics) UNKNOWN Medications:  Current Outpatient Medications on File Prior to Visit   Medication Sig Dispense Refill   ? alendronate (FOSAMAX) 70 mg tablet      ? baclofen (LIORESAL) 10 mg tablet Take 10 mg by mouth twice daily.     ? buPROPion SR(+) (WELLBUTRIN-SR) 150 mg tablet Take 150 mg by mouth twice daily.     ? calcium carbonate (CALCIUM 500 PO) Take  by mouth.     ? cholecalciferol (VITAMIN D-3) 1,000 units tablet Take 1,000 Units by mouth daily.     ? clonazePAM (KLONOPIN) 1 mg tablet Take 1 mg by mouth twice daily.     ? gabapentin (NEURONTIN) 300 mg capsule Take three capsules by mouth three times daily. 270 capsule 5   ? KETOROLAC TROMETHAMINE (KETOROLAC IJ) Inject  to area(s) as directed.     ? latanoprost (XALATAN) 0.005 % ophthalmic solution Apply one drop to both eyes at bedtime daily. 2.5 mL 11   ? omeprazole DR(+) (PRILOSEC) 40 mg capsule Take 40 mg by mouth daily before breakfast.     ? rosuvastatin (CRESTOR) 5 mg tablet Take 5 mg by mouth at bedtime daily.       No current facility-administered medications on file prior to visit.        Physical examination:   Ht 162.6 cm (64)  - Wt 47.6 kg (105 lb)  - BMI 18.02 kg/m?        Female Opioid Risk Score: 0 (08/09/2018  8:42 AM)  Opioid Risk Category: Low Risk (08/09/2018  8:42 AM)    Exam performed by instructing patient to perform various maneuvers and provide feedback while I personally visualized the patient exam.     General: The patient is a well-developed, well nourished 69 y.o. female in no acute distress.   HEENT: Head is normocephalic and atraumatic. +tinted glasses  Pulmonary: The patient has unlabored respirations and bilateral symmetric chest excursion.   Abdomen: Soft, nontender, and nondistended. There is no rebound or guarding per patient assessment.  Extremities: No clubbing, cyanosis, or edema as seen on video.    Neurologic:   The patient is alert and oriented times 3.    There is no sensory deficit to light touch or pinprick in the affected areas. There is no allodynia noted.    +mild pain with palpation at the base of the occiput bilaterally, R>L, no radiating pain noted    Mild  TTP to upper trapezius and levator scapulae with increased muscle tone bilaterally, R>L.    Musculoskeletal:   Gait is normal.    C-Spine    There is mild paraspinal tenderness. Paraspinal muscle tone is increased.  ROM with flexion, extension, rotation, and lateral bending is intact but stiff and tight - most noted with bending.   Patient believes strength is equal and adequate bilaterally in the flexors and extensors of the bilateral upper extremities.          MRI C-Spine Results:  11/2015  C2-3 mild disc desiccation  C3-4 mod loss of disc height  C4-5 mild disc dessication. Left paracentral disc protrusion. mild R FA.   C5-6 +FA. Broad DOC. Left foraminal and paracentral disc protrusion. Miild-mod L NFS. Mild R NFS. Some effacement of thecal sac.  C6-7 DDD.   Spondylosis most pronounced at C5-6.       Last Cr and LFT's:  Creatinine   Date Value Ref Range Status   08/09/2018 0.90 0.4 - 1.00 MG/DL Final     AST (SGOT)   Date Value Ref Range Status   08/09/2018 20 7 - 40 U/L Final     ALT (SGPT)   Date Value Ref Range Status   08/09/2018 20 7 - 56 U/L Final     Alk Phosphatase   Date Value Ref Range Status   08/09/2018 104 25 - 110 U/L Final     Total Bilirubin   Date Value Ref Range Status   08/09/2018 0.4 0.3 - 1.2 MG/DL Final          Assessment:    Rita Gibson is a 69 y.o. female who  has a past medical history of Generalized headaches and Migraines. who presents for evaluation of neck pain.     The pain complaints are most likely due to:    1. Cervical radiculopathy  [place] [facility] SPINE INJECT INTERLAM CRV/THRC   2. DDD (degenerative disc disease), cervical  [place] [facility] SPINE INJECT INTERLAM CRV/THRC   3. Myalgia, other site  [place] [facility] SPINE PROC TRIGGER POINT INJECTION       Patient has had an adequate trial of > 3 months of rest, exercise, NSAID's, multimodal treatment, and the passage of time without improvement of symptoms. The pain has significant impact on the daily quality of life.     Plan:    1. Discussed plan of care options with patient. Will schedule for  repeat CESI and TPI in 3 months.  3. Will increase Gabapentin 1200mg  TID for neuropathic pain. Discussed risks and benefits of medication therapy and possible SEs, patient verbalized understanding and all questions answered.   4. May consider facet-directed therapies in future.  5. May still consider supraorbital nerve blocks in future.  6. Follow-up annually.     Risks/benefits of all pharmacologic and interventional treatments discussed and questions answered.     Todays visit took place via face-to-face encounter utilizing Zoom application. Visit Start Time 1115 Visit End Time 1133.

## 2019-05-21 ENCOUNTER — Encounter: Admit: 2019-05-21 | Discharge: 2019-05-21 | Payer: MEDICARE

## 2019-05-21 NOTE — Telephone Encounter
Patient called c/o of new itching and irritation OU. She denied any decrease in vision. She stated the pain was more of a soreness OU. She informed me that she is not currently using artificial tears, just latanoprost. Instructed patient to use Systane, Refresh, or Theratears over the next 24-48 hours to see if symptoms improve. Should symptoms worsen, advised patient to call back for further evaluation and potential appointment. Patient verbalized understanding and agreed with treatment plan. No further action at this time.

## 2019-07-27 ENCOUNTER — Encounter: Admit: 2019-07-27 | Discharge: 2019-07-27 | Payer: MEDICARE

## 2019-07-27 DIAGNOSIS — M5412 Radiculopathy, cervical region: Secondary | ICD-10-CM

## 2019-07-30 ENCOUNTER — Encounter: Admit: 2019-07-30 | Discharge: 2019-07-30 | Payer: MEDICARE

## 2019-07-30 ENCOUNTER — Ambulatory Visit: Admit: 2019-07-30 | Discharge: 2019-07-30 | Payer: MEDICARE

## 2019-07-30 DIAGNOSIS — M5030 Other cervical disc degeneration, unspecified cervical region: Secondary | ICD-10-CM

## 2019-07-30 DIAGNOSIS — M5412 Radiculopathy, cervical region: Secondary | ICD-10-CM

## 2019-07-30 DIAGNOSIS — M7918 Myalgia, other site: Secondary | ICD-10-CM

## 2019-07-30 MED ORDER — LIDOCAINE (PF) 10 MG/ML (1 %) IJ SOLN
2 mL | Freq: Once | INTRAMUSCULAR | 0 refills | Status: CP
Start: 2019-07-30 — End: ?
  Administered 2019-07-30: 18:00:00 2 mL via INTRAMUSCULAR

## 2019-07-30 MED ORDER — IOHEXOL 300 MG IODINE/ML IV SOLN
2 mL | Freq: Once | 0 refills | Status: DC
Start: 2019-07-30 — End: 2019-07-30

## 2019-07-30 MED ORDER — TRIAMCINOLONE ACETONIDE 40 MG/ML IJ SUSP
80 mg | Freq: Once | EPIDURAL | 0 refills | Status: CP
Start: 2019-07-30 — End: ?
  Administered 2019-07-30: 18:00:00 80 mg via EPIDURAL

## 2019-07-30 MED ORDER — IOPAMIDOL 41 % IT SOLN
2.5 mL | Freq: Once | EPIDURAL | 0 refills | Status: CP
Start: 2019-07-30 — End: ?
  Administered 2019-07-30: 18:00:00 2.5 mL via EPIDURAL

## 2019-07-30 NOTE — Patient Instructions
Procedure Completed Today: Cervical Epidural Steroid Injection    Important information following your procedure today: You may drive today    1. Pain relief may not be immediate. It is possible you may even experience an increase in pain during the first 24-48 hours followed by a gradual decrease of your pain.  2. Though the procedure is generally safe and complications are rare, we do ask that you be aware of any of the following:   ? Any swelling, persistent redness, new bleeding, or drainage from the site of the injection.  ? You should not experience a severe headache.  ? You should not run a fever over 101? F.  ? New onset of sharp, severe back & or neck pain.  ? New onset of upper or lower extremity numbness or weakness.  ? New difficulty controlling bowel or bladder function after the injection.  ? New shortness of breath.    If any of these occur, please call to report this occurrence to a nurse at [phone]. If you are calling after 4:00 p.m., on weekends or holidays please call [phone] and ask to have the resident physician on call for the physician paged or go to your local emergency room.  3. You may experience soreness at the injection site. Ice can be applied at 20 minute intervals. Avoid application of direct heat, hot showers or hot tubs today.  4. Avoid strenuous activity today. You may resume your regular activities and exercise tomorrow.  5. Patients with diabetes may see an elevation in blood sugars for 7-10 days after the injection. It is important to pay close attention to your diet, check your blood sugars daily and report extreme elevations to the physician that treats your diabetes.  6. Patients taking a daily blood thinner can resume their regular dose this evening.  7. It is important that you take all medications ordered by your pain physician. Taking medication as ordered is an important part of your pain care plan. If you cannot continue the medication plan, please notify the physician.     Possible side effects to steroids that may occur:  ? Flushing or redness of the face  ? Irritability  ? Fluid retention  ? Change in women?s menses    The following medications were used: Lidocaine , Triamcinolone   and Contrast Dye

## 2019-07-30 NOTE — Progress Notes

## 2019-07-31 ENCOUNTER — Encounter: Admit: 2019-07-31 | Discharge: 2019-07-31 | Payer: MEDICARE

## 2019-08-06 ENCOUNTER — Encounter: Admit: 2019-08-06 | Discharge: 2019-08-06 | Payer: MEDICARE

## 2019-08-06 ENCOUNTER — Ambulatory Visit: Admit: 2019-08-06 | Discharge: 2019-08-06 | Payer: MEDICARE

## 2019-08-06 DIAGNOSIS — G43909 Migraine, unspecified, not intractable, without status migrainosus: Secondary | ICD-10-CM

## 2019-08-06 DIAGNOSIS — H401233 Low-tension glaucoma, bilateral, severe stage: Secondary | ICD-10-CM

## 2019-08-06 DIAGNOSIS — R519 Generalized headaches: Secondary | ICD-10-CM

## 2019-08-06 NOTE — Progress Notes
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 RX is incorrect OD and vision is worse. Reprinted recent RX for pt to bring back to her OD       Return in about 3 months (around 11/06/2019) for [employer] Return - NTG.    Corky Sox, MD   Department of Ophthalmology      HPI:  Patient presents with:  Eye Problem: Follow up LTG OU. Patient recently noticed an aura of blue red and green lights- Appeared to look like snowflakes. The episode did not last very long and she denies any loss of vision. She has a h/o migraines. She also noted diffuclty yesterday focusing while reading a board at [employer].  Treatment: Latanoprost QHS OU        Exam:  Base Eye Exam     Visual Acuity (Snellen - Linear)       Right Left    Dist cc 20/50 +2 20/30 slow +2    Dist ph cc 20/40 +2     Correction: Glasses          Tonometry (Tonopen, 9:07 AM)       Right Left    Pressure 18 17          Pupils       Pupils Dark Shape React APD    Right PERRL 5 Round Minimal None    Left PERRL 5 Round Minimal None          Visual Fields       Left Right     Full     Restrictions  Partial outer superior temporal, inferior temporal deficiencies          Extraocular Movement       Right Left     Full Full          Neuro/Psych     Oriented x3: Yes    Mood/Affect: Normal          Dilation     Both eyes: 1.0% Tropicamide, 2.5% Phenylephrine @ 9:08 AM            Slit Lamp and Fundus Exam     Slit Lamp Exam       Right Left    Lids/Lashes Normal Normal Conjunctiva/Sclera White and quiet White and quiet    Cornea Clear Clear    Anterior Chamber Deep and quiet Deep and quiet    Iris Flat Flat    Lens 1+ NS 1+ NS          Fundus Exam       Right Left    Vitreous Normal Normal    Disc notching, no pallor Sharp, healthy rim    C/D Ratio 0.7 0.2    Macula Flat Flat    Vessels Normal caliber  and number Normal caliber and number    Periphery Attached, no breaks or tears Attached, no breaks or tears            Refraction     Wearing Rx       Sphere Cylinder Axis Add    Right -3.00 +0.50 092 +2.50    Left -4.25 +0.75 112 +2.50    Type: PAL          Manifest Refraction       Sphere Cylinder Axis Dist VA Add    Right -4.25 +1.50 090 20/25-2 slow +2.50    Left -4.00 +1.00 095 20/25+2 +2.50          Final Rx       Sphere Cylinder Axis Dist VA Add    Right -4.25 +1.50 090 20/25-2 slow +2.50    Left -4.00 +1.00 095 20/25+2 +2.50    Expiration Date: 08/06/2020    Comments: Please change OD

## 2019-08-06 NOTE — Assessment & Plan Note
Cont latanoprost QHS OU - pt did not use last night and IOP is above goal    Pt understands importance of compliance    Recheck in 3 mos with HVF    OCT nerve stable    Pt still using CPAP

## 2019-08-23 ENCOUNTER — Encounter: Admit: 2019-08-23 | Discharge: 2019-08-23 | Payer: MEDICARE

## 2019-09-03 ENCOUNTER — Encounter: Admit: 2019-09-03 | Discharge: 2019-09-03 | Payer: MEDICARE

## 2019-09-28 ENCOUNTER — Encounter: Admit: 2019-09-28 | Discharge: 2019-09-28 | Payer: MEDICARE

## 2019-09-28 NOTE — Telephone Encounter
Pt's spouse calls about form needing to be filled out. States that it is so Jalani can keep her driver's license in the state of [place].     Review that this is not typically a form Dr. Lourdes Sledge would fill out but that we can look it over.

## 2019-10-01 ENCOUNTER — Encounter: Admit: 2019-10-01 | Discharge: 2019-10-01 | Payer: MEDICARE

## 2019-10-01 DIAGNOSIS — H469 Unspecified optic neuritis: Secondary | ICD-10-CM
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
## 2019-10-04 ENCOUNTER — Encounter: Admit: 2019-10-04 | Discharge: 2019-10-04 | Payer: MEDICARE

## 2019-10-04 MED ORDER — LATANOPROST 0.005 % OP DROP
1 [drp] | Freq: Every evening | OPHTHALMIC | 11 refills | 28.00000 days | Status: AC
Start: 2019-10-04 — End: ?

## 2019-10-04 MED ORDER — LATANOPROST 0.005 % OP DROP
Freq: Every day | OPHTHALMIC | 11 refills | 28.00000 days | Status: DC
Start: 2019-10-04 — End: 2019-10-04

## 2019-10-04 NOTE — Telephone Encounter
Pt is out of drops and went out of town and is needing a refill at a location near her in [place] ,[place]/ [facility]     latanoprosl ophghobmic-(asked pt to spell out the RX )     If you can call the pt to let her know which one you sent it to and to verify the Rx that is needing to be filled. [phone]    - she didn't have enough to place drops last night so she is really needing it for tonight.

## 2019-10-08 ENCOUNTER — Encounter: Admit: 2019-10-08 | Discharge: 2019-10-08 | Payer: MEDICARE

## 2019-10-08 NOTE — Telephone Encounter
Patient contacted clinic today indicating that she wanted information regarding the referral Dr Loel Lofty was putting in for her to see.  Patient unsure of what specialty.  It appears that a referral was sent to [facility] on Aug. 9. 2021.  Told patient that the dept/clinic typically calls patient directly to schedule accordingly and if she has not heard from them by this week, she can feel free to contact them directly.  No further action required today.

## 2019-10-15 ENCOUNTER — Encounter: Admit: 2019-10-15 | Discharge: 2019-10-15 | Payer: MEDICARE

## 2019-10-16 ENCOUNTER — Encounter: Admit: 2019-10-16 | Discharge: 2019-10-16 | Payer: MEDICARE

## 2019-10-16 ENCOUNTER — Ambulatory Visit: Admit: 2019-10-16 | Discharge: 2019-10-17 | Payer: MEDICARE

## 2019-10-16 DIAGNOSIS — R519 Generalized headaches: Secondary | ICD-10-CM

## 2019-10-16 DIAGNOSIS — G4733 Obstructive sleep apnea (adult) (pediatric): Secondary | ICD-10-CM

## 2019-10-16 DIAGNOSIS — G43909 Migraine, unspecified, not intractable, without status migrainosus: Secondary | ICD-10-CM

## 2019-10-16 NOTE — Progress Notes
Date of Service: 10/16/2019    Subjective:             Rita Gibson is a 69 y.o. female.    History of Present Illness  This patient presents in person alone.    Doing well with CPAP.  Aware of recall.  Tried to stop it but HA returned and EDS returned.  Now using it.  No black specks.    Also needs paperwork for drivers license filled out (done and copy given to patient).    States has all her doctors fill out her paperwork.         Review of Systems   Constitutional: Negative.    Respiratory: Positive for apnea.    Neurological: Negative.    Psychiatric/Behavioral: Negative.    All other systems reviewed and are negative.        Objective:         ? alendronate (FOSAMAX) 70 mg tablet    ? baclofen (LIORESAL) 10 mg tablet Take 10 mg by mouth twice daily.   ? buPROPion SR(+) (WELLBUTRIN-SR) 150 mg tablet Take 150 mg by mouth twice daily.   ? calcium carbonate (CALCIUM 500 PO) Take  by mouth.   ? cholecalciferol (VITAMIN D-3) 1,000 units tablet Take 1,000 Units by mouth daily.   ? clonazePAM (KLONOPIN) 1 mg tablet Take 1 mg by mouth twice daily.   ? gabapentin (NEURONTIN) 300 mg capsule Take three capsules by mouth three times daily.   ? KETOROLAC TROMETHAMINE (KETOROLAC IJ) Inject  to area(s) as directed.   ? latanoprost (XALATAN) 0.005 % ophthalmic solution Apply one drop to both eyes at bedtime daily.   ? omeprazole DR(+) (PRILOSEC) 40 mg capsule Take 40 mg by mouth daily before breakfast.   ? rosuvastatin (CRESTOR) 5 mg tablet Take 5 mg by mouth at bedtime daily.     Vitals:    10/16/19 0805   BP: (!) 149/80   Pulse: 78   Weight: 45.8 kg (101 lb)   Height: 162.6 cm (64)   PainSc: Zero     Body mass index is 17.34 kg/m?Marland Kitchen     Physical Exam  Alert.  Fluent.  Good mood.  NAD.         Assessment and Plan:  *The patient's Epworth Sleepiness Scale Score is 2/24.     If score > 3 there is a high probability that they have OSA.  Depression Screening was performed on Lashasta Cruickshank in clinic today. Based on the score of 0, no follow up action or recommendations are necessary at this time.**  Problem   Osa (Obstructive Sleep Apnea)    Sleep study: The AHI using 4% criteria was 5.6 events per hour, due to 3 obstructive apneas, 37 hypopneas, 0 central apneas, 0 mixed apneas.  The REM AHI was 16, supine REM AHI 20.6 and lateral REM AHI was n/a. The overall supine AHI was using 3% criteria was 6.8 and lateral AHI was 1.     Patient has REM associated sleep apnea     Has been compliant with using CPAP nightly and reports a significant benefit in her sleep. She does report redness on her face due to the straps being too tight. Also had a HA last night which improved after loosening the straps. Complains of occasional mouth dryness           OSA (obstructive sleep apnea)  Qualifying diagnosis is OSA  Compliance report has been reviewed by me.  The patient is using the CPAP device as directed.  Patient is benefiting from the prescribed CPAP.  Patient has a continued need for CPAP device.      Device recalled.  She stopped when she heard about recall, but sxs returned and she didn't feel well.  She then restarted and has felt better.    Discussed the recall and the degradation of the foam filter and potential chemical exposure.     In line CPAP filter may filter out some of the particles, but not the chemicals.  No repair or other option has been supplied by [facility] at this point.    Her mask is too large and is leaking.  She needs a refit and supplies.

## 2019-10-16 NOTE — Telephone Encounter
Their dept is no longer taking outside referrals.

## 2019-10-18 ENCOUNTER — Encounter: Admit: 2019-10-18 | Discharge: 2019-10-18 | Payer: MEDICARE

## 2019-10-23 ENCOUNTER — Encounter: Admit: 2019-10-23 | Discharge: 2019-10-23 | Payer: MEDICARE

## 2019-10-23 MED ORDER — LATANOPROST 0.005 % OP DROP
Freq: Every day | OPHTHALMIC | 0 refills | 28.00000 days | Status: AC
Start: 2019-10-23 — End: ?

## 2019-11-02 ENCOUNTER — Encounter: Admit: 2019-11-02 | Discharge: 2019-11-02 | Payer: MEDICARE

## 2019-11-02 DIAGNOSIS — M5412 Radiculopathy, cervical region: Secondary | ICD-10-CM

## 2019-11-05 ENCOUNTER — Encounter: Admit: 2019-11-05 | Discharge: 2019-11-05 | Payer: MEDICARE

## 2019-11-05 ENCOUNTER — Ambulatory Visit: Admit: 2019-11-05 | Discharge: 2019-11-05 | Payer: MEDICARE

## 2019-11-05 DIAGNOSIS — M5030 Other cervical disc degeneration, unspecified cervical region: Secondary | ICD-10-CM

## 2019-11-05 DIAGNOSIS — M7918 Myalgia, other site: Secondary | ICD-10-CM

## 2019-11-05 DIAGNOSIS — M5412 Radiculopathy, cervical region: Secondary | ICD-10-CM

## 2019-11-05 DIAGNOSIS — R519 Generalized headaches: Secondary | ICD-10-CM

## 2019-11-05 DIAGNOSIS — G43909 Migraine, unspecified, not intractable, without status migrainosus: Secondary | ICD-10-CM

## 2019-11-05 MED ORDER — IOHEXOL 240 MG IODINE/ML IV SOLN
2.5 mL | Freq: Once | EPIDURAL | 0 refills | Status: CP
Start: 2019-11-05 — End: ?
  Administered 2019-11-05: 18:00:00 2.5 mL via EPIDURAL

## 2019-11-05 MED ORDER — TRIAMCINOLONE ACETONIDE 40 MG/ML IJ SUSP
80 mg | Freq: Once | EPIDURAL | 0 refills | Status: CP
Start: 2019-11-05 — End: ?
  Administered 2019-11-05: 18:00:00 80 mg via EPIDURAL

## 2019-11-05 MED ORDER — IOPAMIDOL 41 % IT SOLN
2.5 mL | Freq: Once | EPIDURAL | 0 refills | Status: DC
Start: 2019-11-05 — End: 2019-11-05

## 2019-11-05 NOTE — Patient Instructions
Procedure Completed Today: Cervical Epidural Steroid Injection    Important information following your procedure today: You may drive today    1. Pain relief may not be immediate. It is possible you may even experience an increase in pain during the first 24-48 hours followed by a gradual decrease of your pain.  2. Though the procedure is generally safe and complications are rare, we do ask that you be aware of any of the following:   ? Any swelling, persistent redness, new bleeding, or drainage from the site of the injection.  ? You should not experience a severe headache.  ? You should not run a fever over 101? F.  ? New onset of sharp, severe back & or neck pain.  ? New onset of upper or lower extremity numbness or weakness.  ? New difficulty controlling bowel or bladder function after the injection.  ? New shortness of breath.    If any of these occur, please call to report this occurrence to a nurse at [phone]. If you are calling after 4:00 p.m., on weekends or holidays please call [phone] and ask to have the resident physician on call for the physician paged or go to your local emergency room.  3. You may experience soreness at the injection site. Ice can be applied at 20 minute intervals. Avoid application of direct heat, hot showers or hot tubs today.  4. Avoid strenuous activity today. You may resume your regular activities and exercise tomorrow.  5. Patients with diabetes may see an elevation in blood sugars for 7-10 days after the injection. It is important to pay close attention to your diet, check your blood sugars daily and report extreme elevations to the physician that treats your diabetes.  6. Patients taking a daily blood thinner can resume their regular dose this evening.  7. It is important that you take all medications ordered by your pain physician. Taking medication as ordered is an important part of your pain care plan. If you cannot continue the medication plan, please notify the physician.     Possible side effects to steroids that may occur:  ? Flushing or redness of the face  ? Irritability  ? Fluid retention  ? Change in women?s menses    The following medications were used: Lidocaine , Triamcinolone   and Contrast Dye

## 2019-11-05 NOTE — Progress Notes
[facility]  INTERVENTIONAL PAIN PROCEDURE HISTORY AND PHYSICAL    No chief complaint on file.  Pain    HISTORY OF PRESENT ILLNESS:  Patient returns today for interventional treatment of pain. Patient denies any recent fevers, chills, infection, antibiotics, coagulopathy or contra-indicated anticoagulants. Risks of the procedure were discussed including but not limited to bleeding, infection, damage to surrounding structures and reaction to medications. Patient reports understanding and has elected to proceed with the procedure.       Medical History:   Diagnosis Date   ? Generalized headaches    ? Migraines        Surgical History:   Procedure Laterality Date   ? ANKLE SURGERY     ? HEART CATHETERIZATION     ? TUBAL LIGATION         family history includes Arthritis in her mother; Cancer in her father; Cataract in her mother; Glaucoma in her mother.    Social History     Socioeconomic History   ? Marital status: Married     Spouse name: Not on file   ? Number of children: Not on file   ? Years of education: Not on file   ? Highest education level: Some college, no degree   Occupational History   ? Not on file   Tobacco Use   ? Smoking status: Never Smoker   ? Smokeless tobacco: Never Used   Vaping Use   ? Vaping Use: Never used   Substance and Sexual Activity   ? Alcohol use: Not Currently   ? Drug use: Never   ? Sexual activity: Not on file   Other Topics Concern   ? Not on file   Social History Narrative   ? Not on file       Allergies   Allergen Reactions   ? Isometheptene CHEST TIGHTNESS, CHILLS and MUSCLE PAIN   ? Sulfa (Sulfonamide Antibiotics) UNKNOWN       There were no vitals filed for this visit.    REVIEW OF SYSTEMS: 10 point ROS obtained and negative except as above and below      PHYSICAL EXAM:  General: Alert, cooperative, no acute distress.  HEENT: Normocephalic, atraumatic.  Neck: Supple.  Lungs: Unlabored respirations, bilateral and equal chest excursion.  Heart: Regular rate.  Skin: Warm and dry to touch.  Abdomen: Nondistended.  MSK: No deformity.  Neurological: Alert and oriented x3.         IMPRESSION:    1. Cervical radiculopathy         PLAN: Cervical Epidural Steroid Injection C7-T1 CESI    Lesli Albee, MD  PGY-5 Interventional Pain Medicine Fellow  Chronic Pain Consult Pager [phone]        ATTESTATION    I personally performed the key portions of the E/M visit, discussed case with resident and concur with resident documentation of history, physical exam, assessment, and treatment plan unless otherwise noted.    Staff name:  Evelina Bucy, MD Date:  11/05/2019

## 2019-11-05 NOTE — Progress Notes

## 2019-11-06 NOTE — Procedures
Attending Surgeon: Evelina Bucy, MD    Anesthesia: Local    Pre-Procedure Diagnosis:   1. Cervical radiculopathy    2. Myalgia, other site    3. DDD (degenerative disc disease), cervical        Post-Procedure Diagnosis:   1. Cervical radiculopathy    2. Myalgia, other site    3. DDD (degenerative disc disease), cervical        [facility] INJECT INTERLAM CR[facility]/THRC  Procedure: epidural - interlaminar    Laterality: n/a   on 11/05/2019 12:30 PM  Location: cervical ESI with imaging - C7-T1      Consent:   Consent obtained: written  Consent given by: patient  Risks discussed: allergic reaction, bleeding, bruising, infection, never damage, no change or worsening in pain, pneumo thorax, reaction to medication, seizure, swelling and weakness  Alternatives discussed: alternative treatment, delayed treatment and no treatment  Discussed with patient the purpose of the treatment/procedure, other ways of treating my condition, including no treatment/ procedure and the risks and benefits of the alternatives. Patient has decided to proceed with treatment/procedure.        Universal Protocol:  Relevant documents: relevant documents present and verified  Site marked: the operative site was marked  Patient identity confirmed: Patient identify confirmed verbally with patient.        Time out: Immediately prior to procedure a time out was called to verify the correct patient, procedure, equipment, support staff and site/side marked as required      Procedures Details:   Indications: pain   Prep: chlorhexidine  Patient position: prone  Estimated Blood Loss: minimal  Specimens: none  Amount Injected:   C7-T1: 4mL  Number of Joints: 1  Approach: midline  Guidance: fluoroscopy  Contrast: Procedure confirmed with contrast under live fluoroscopy.  Needle and Epidural Catheter: tuohy  Needle size: 18 G  Injection procedure: Incremental injection and Negative aspiration for blood  Patient tolerance: Patient tolerated the procedure well with no immediate complications. Pressure was applied, and hemostasis was accomplished.  Outcome: Pain unchanged  Comments:   CERVICAL INTERLAMINAR EPIDURAL STEROID INJECTION    PROCEDURE:  1) C7-T1 interlaminar epidural steroid injection  2) Fluoroscopic needle guidance    REASON FOR PROCEDURE: Cervical radiculopathy, DDD (cervical)    ATTENDING PHYSICIAN: Evelina Bucy, MD    MEDICATIONS INJECTED: 2 mL of triamciniolone (80 mg) and 2 mL of sterile, preservative-free normal saline    LOCAL ANESTHETIC INJECTED: 1 mL of 1% lidocaine    SEDATION MEDICATIONS: None    ESTIMATED BLOOD LOSS: None    SPECIMENS REMOVED: None    COMPLICATIONS: None    TECHNIQUE: Time-out was taken to identify the correct patient, procedure and side prior to starting the procedure. With the patient lying in a prone position with the neck in a slightly  flexed position, the area was prepped and draped in sterile fashion using DuraPrep and a fenestrated drape. The area was determined under fluoroscopic guidance. A 27-gauge, 1.25-inch  needle was used to anesthetize the needle entry site and subcutaneous tissues.     The 18-gauge, 3.5-inch Tuohy needle was advanced through the ligamentum flavum using loss of resistance technique. Once the tip of the needle was thought to be in the desired position, contrast was injected to confirm only epidural spread and no vascular runoff via A-P and lateral  views. The injectate was then injected slowly with intermittent negative aspiration.    The procedure was completed without complications and was tolerated well.  The patient was monitored after the procedure. The patient (or responsible party) was given post-procedure and  discharge instructions to follow at home. The patient was discharged in stable condition.    Evelina Bucy, MD, MBA      Trigger Point Injection  Locations: L cervical, R cervical, L upper trapezius, R upper trapezius, L levator scapulae and R levator scapulae  Consent:   Consent obtained: written  Consent given by: patient  Alternatives discussed: alternative treatment, delayed treatment and no treatment  Discussed with patient the purpose of the treatment/procedure, other ways of treating my condition, including no treatment/ procedure and the risks and benefits of the alternatives. Patient has decided to proceed with treatment/procedure.        Universal Protocol:  Relevant documents: relevant documents present and verified  Site marked: the operative site was marked  Patient identity confirmed: Patient identify confirmed verbally with patient.        Time out: Immediately prior to procedure a time out was called to verify the correct patient, procedure, equipment, support staff and site/side marked as required      Procedures Details:   Indications: muscle spasm and myalgiaPrep: 2% chlorhexidine  Needle size: 27 G  Number of muscles: 3 or more  Approach: posterior  Patient tolerance: Patient tolerated the procedure well with no immediate complications. Pressure was applied, and hemostasis was accomplished.  Comments:   PROCEDURE: Trigger Point Injections    Pre-Procedure Diagnoses:  1. Myalgia, other  2. Myofascial pain  3. Muscle spasm    Post-Procedure Diagnoses:  Same    Physician: Evelina Bucy, MD    ANESTHESIA: Lidocaine 1%    COMPLICATIONS: None.    Location(s) of pain: As noted above  Presence of point tenderness in a tight band of muscle in above area: Yes  Restricted range of motion: Yes  Conservative therapy attempted for at least 6 weeks: activity modification, pharmacotherapy  Other components of comprehensive management: pharmacotherapy, activity modification    DESCRIPTION OF PROCEDURE: The procedure risks, hazards and alternatives were discussed with the patient and a proper consent was obtained. The area over each trigger point was prepped with alcohol utilizing sterile technique. After isolating it between two palpating fingertips a 27-gauge 1.5 needle was placed in the center of the myofascial spasms and a negative aspiration was performed. Then 0.5cc of the solution above was injected into each trigger point.     Trigger points in each of the muscle groups noted above were injected. A total of 5 ml of the local anesthetic solution was utilized.    The patient tolerated the procedure well without any apparent difficulties or complications.          Estimated blood loss: none or minimal  Specimens: none  Patient tolerated the procedure well with no immediate complications. Pressure was applied, and hemostasis was accomplished.

## 2019-11-09 ENCOUNTER — Encounter: Admit: 2019-11-09 | Discharge: 2019-11-09 | Payer: MEDICARE

## 2019-11-09 NOTE — Telephone Encounter
Pt called today b/c she has an appt with the Dr. Claiborne Billings Dr. Sharlette Dense wanted her to see but now the clinic is saying that he is not a neuro-psych. The pt would like to know what she should do?  Thank you

## 2019-11-09 NOTE — Telephone Encounter
SPK TO PT. THE DR SHE FOUND AT [facility] IS NOT A NEURO PHYCOLOGIST. GAVE PT NUMBER TO [facility]. THEY HAVE HER REF ALREADY. BK.

## 2019-11-22 ENCOUNTER — Encounter: Admit: 2019-11-22 | Discharge: 2019-11-22 | Payer: MEDICARE

## 2019-12-03 ENCOUNTER — Encounter: Admit: 2019-12-03 | Discharge: 2019-12-03 | Payer: MEDICARE

## 2019-12-03 ENCOUNTER — Ambulatory Visit: Admit: 2019-12-03 | Discharge: 2019-12-04 | Payer: MEDICARE

## 2019-12-03 DIAGNOSIS — M7918 Myalgia, other site: Secondary | ICD-10-CM

## 2019-12-03 DIAGNOSIS — R519 Chronic intractable headache, unspecified headache type: Secondary | ICD-10-CM

## 2019-12-03 DIAGNOSIS — M5412 Radiculopathy, cervical region: Secondary | ICD-10-CM

## 2019-12-03 DIAGNOSIS — M5030 Other cervical disc degeneration, unspecified cervical region: Secondary | ICD-10-CM

## 2019-12-03 DIAGNOSIS — G43909 Migraine, unspecified, not intractable, without status migrainosus: Secondary | ICD-10-CM

## 2019-12-03 MED ORDER — GABAPENTIN 600 MG PO TAB
1200 mg | ORAL_TABLET | ORAL | 2 refills | Status: AC
Start: 2019-12-03 — End: ?

## 2019-12-03 NOTE — Progress Notes
Obtained patient's verbal consent to treat them and their agreement to [facility] financial policy and NPP via this telehealth visit during the [facility]    [facility] - Interventional Pain      Subjective     Chief Complaint: neck/head pain    Interval HPI: Rita Gibson is a 68 y.o. female who  has a past medical history of Generalized headaches and Migraines. who now presents for evaluation.    Patient presents to clinic for evaluation of low neck and headaches.   ?  Patient reports over 90% relief after CESI and cervical TPI performed on 9/13//2021. Patient states her pain is overall improved by the injections, and is happy with level of relief. Patient states she has had one bad migraine since the procedure, but that is very rare and relieved with prescribed medications.    Patient reports her pain is relatively unchanged since last office visit.   ?  The pain is in the low neck region, will radiate to the base of the skull to the right side of the top to the head to the middle forehead and back of the eyes bilaterally  Pain is intermittent, rare after injection  Numbness/tingling: none  The pain ranges 0-9/10  The pain is described as dull, sore, stabbing, sharp   The pain is exacerbated by household chores, lifting arms above shoulder level  The pain is partially alleviated by rest, heating pad, ice, prescribed medications (gabapentin/Toradol)  +muscle stiffness/tightness  Denies strength loss in BUE/BLE  ?  Patient reports currently taking Baclofen BID as prescribed by PCP.     Patient taking Gabapentin 900mg  as prescribed, reports mild relief of pain with medication use, denies any SEs from medication use.  ?  ?  Hx of spine surgery: none  ?  Patient denies fevers, chills, infection, bleeding issues, anticoagulants, new muscle weakness, numbness, tingling, bowel/bladder incontinence, or saddle anesthesia.       ROS:   Review of Systems   Musculoskeletal: Positive for back pain, myalgias and neck pain.   Neurological: Positive for headaches.            PRIOR MEDICATIONS:   Effective  Gabapentin  Baclofen      Ineffective  NSAID    Unable to tolerate  Nortriptyline    Never  Lyrica  Cymbalta  Tizanidine      PRIOR INTERVENTIONS:  No spine surgery   Effective  CESI  TPI  Supraorbital nerve blocks    Ineffective  Physician-directed PT  Exercise        Rita Gibson denies any recent fevers, chills, infection, antibiotics, bowel or bladder incontinence, saddle anesthesia, bleeding issues, or recent anticoagulant.       Past Medical History:  Medical History:   Diagnosis Date   ? Generalized headaches    ? Migraines        Family History:  Family History   Problem Relation Age of Onset   ? Arthritis Mother    ? Cataract Mother    ? Glaucoma Mother    ? Cancer Father    ? Amblyopia Neg Hx    ? Blindness Neg Hx    ? Strabismus Neg Hx    ? Retinal Detachment Neg Hx    ? Macular Degen Neg Hx        Social History:  [place]   Social History     Socioeconomic History   ? Marital status:  Married     Spouse name: Not on file   ? Number of children: Not on file   ? Years of education: Not on file   ? Highest education level: Some college, no degree   Occupational History   ? Not on file   Tobacco Use   ? Smoking status: Never Smoker   ? Smokeless tobacco: Never Used   Vaping Use   ? Vaping Use: Never used   Substance and Sexual Activity   ? Alcohol use: Not Currently   ? Drug use: Never   ? Sexual activity: Not on file   Other Topics Concern   ? Not on file   Social History Narrative   ? Not on file       Allergies:  Allergies   Allergen Reactions   ? Isometheptene CHEST TIGHTNESS, CHILLS and MUSCLE PAIN   ? Sulfa (Sulfonamide Antibiotics) UNKNOWN       Medications:  Current Outpatient Medications on File Prior to Visit   Medication Sig Dispense Refill   ? alendronate (FOSAMAX) 70 mg tablet      ? baclofen (LIORESAL) 10 mg tablet Take 10 mg by mouth twice daily.     ? buPROPion SR(+) (WELLBUTRIN-SR) 150 mg tablet Take 150 mg by mouth twice daily.     ? calcium carbonate (CALCIUM 500 PO) Take  by mouth.     ? cholecalciferol (VITAMIN D-3) 1,000 units tablet Take 1,000 Units by mouth daily.     ? clonazePAM (KLONOPIN) 1 mg tablet Take 1 mg by mouth twice daily.     ? gabapentin (NEURONTIN) 300 mg capsule Take three capsules by mouth three times daily. 270 capsule 5   ? KETOROLAC TROMETHAMINE (KETOROLAC IJ) Inject  to area(s) as directed.     ? latanoprost (XALATAN) 0.005 % ophthalmic solution INSTILL 1 DROP INTO EACH EYE ONCE DAILY AT BEDTIME 3 mL 0   ? omeprazole DR(+) (PRILOSEC) 40 mg capsule Take 40 mg by mouth daily before breakfast.     ? rosuvastatin (CRESTOR) 5 mg tablet Take 5 mg by mouth at bedtime daily.       No current facility-administered medications on file prior to visit.       Physical examination:   There were no vitals taken for this visit.       Female Opioid Risk Score: 0 (08/09/2018  8:42 AM)  Opioid Risk Category: Low Risk (08/09/2018  8:42 AM)    Exam performed by instructing patient to perform various maneuvers and provide feedback while I personally visualized the patient exam.     General: The patient is a well-developed, well nourished 69 y.o. female in no acute distress.   HEENT: Head is normocephalic and atraumatic. +tinted glasses  Pulmonary: The patient has unlabored respirations and bilateral symmetric chest excursion.   Abdomen: Soft, nontender, and nondistended. There is no rebound or guarding per patient assessment.  Extremities: No clubbing, cyanosis, or edema as seen on video.    Neurologic:   The patient is alert and oriented times 3.    There is no sensory deficit to light touch or pinprick in the affected areas. There is no allodynia noted.    +mild pain with palpation at the base of the occiput bilaterally, R>L, no radiating pain noted    Mild TTP to upper trapezius and levator scapulae with increased muscle tone bilaterally, R>L.    Musculoskeletal:   Gait is normal.    C-Spine    There  is mild paraspinal tenderness. Paraspinal muscle tone is increased.  ROM with flexion, extension, rotation, and lateral bending is intact but stiff mostly noted with extension.   Patient believes strength is equal and adequate bilaterally in the flexors and extensors of the bilateral upper extremities.          MRI C-Spine Results:  11/2015  C2-3 mild disc desiccation  C3-4 mod loss of disc height  C4-5 mild disc dessication. Left paracentral disc protrusion. mild R FA.   C5-6 +FA. Broad DOC. Left foraminal and paracentral disc protrusion. Miild-mod L NFS. Mild R NFS. Some effacement of thecal sac.  C6-7 DDD.   Spondylosis most pronounced at C5-6.       Last Cr and LFT's:  Creatinine   Date Value Ref Range Status   08/09/2018 0.90 0.4 - 1.00 MG/DL Final     AST (SGOT)   Date Value Ref Range Status   08/09/2018 20 7 - 40 U/L Final     ALT (SGPT)   Date Value Ref Range Status   08/09/2018 20 7 - 56 U/L Final     Alk Phosphatase   Date Value Ref Range Status   08/09/2018 104 25 - 110 U/L Final     Total Bilirubin   Date Value Ref Range Status   08/09/2018 0.4 0.3 - 1.2 MG/DL Final          Assessment:    Rita Gibson is a 69 y.o. female who  has a past medical history of Generalized headaches and Migraines. who presents for evaluation of neck pain.     The pain complaints are most likely due to:    1. Cervical radiculopathy     2. DDD (degenerative disc disease), cervical     3. Myalgia, other site     4. Chronic intractable headache, unspecified headache type         Patient has had an adequate trial of > 3 months of rest, exercise, NSAID's, multimodal treatment, and the passage of time without improvement of symptoms. The pain has significant impact on the daily quality of life.     Plan:    1. Discussed plan of care options with patient. Will schedule for  repeat CESI and TPI in 2-3 months.  2. Continue Gabapentin as prescribed. Refill sent, 90 day supply. Will obtain CMP from PCP.   3. May consider facet-directed therapies in future.  4. May still consider supraorbital nerve blocks in future.  5. Follow-up annually.     Risks/benefits of all pharmacologic and interventional treatments discussed and questions answered.     Todays visit took place via face-to-face encounter utilizing Zoom application. Visit Start Time 0815 Visit End Time [phone].

## 2019-12-04 ENCOUNTER — Encounter: Admit: 2019-12-04 | Discharge: 2019-12-04 | Payer: MEDICARE

## 2019-12-04 MED ORDER — LATANOPROST 0.005 % OP DROP
Freq: Every day | OPHTHALMIC | 0 refills | 28.00000 days | Status: AC
Start: 2019-12-04 — End: ?

## 2019-12-05 ENCOUNTER — Encounter: Admit: 2019-12-05 | Discharge: 2019-12-05 | Payer: MEDICARE

## 2019-12-05 DIAGNOSIS — H469 Unspecified optic neuritis: Secondary | ICD-10-CM

## 2019-12-13 ENCOUNTER — Encounter: Admit: 2019-12-13 | Discharge: 2019-12-13 | Payer: MEDICARE

## 2019-12-19 ENCOUNTER — Encounter: Admit: 2019-12-19 | Discharge: 2019-12-19 | Payer: MEDICARE

## 2019-12-20 ENCOUNTER — Encounter: Admit: 2019-12-20 | Discharge: 2019-12-20 | Payer: MEDICARE

## 2019-12-20 ENCOUNTER — Ambulatory Visit: Admit: 2019-12-20 | Discharge: 2019-12-20 | Payer: MEDICARE

## 2019-12-20 DIAGNOSIS — R519 Generalized headaches: Secondary | ICD-10-CM

## 2019-12-20 DIAGNOSIS — H401233 Low-tension glaucoma, bilateral, severe stage: Secondary | ICD-10-CM

## 2019-12-20 DIAGNOSIS — G43909 Migraine, unspecified, not intractable, without status migrainosus: Secondary | ICD-10-CM

## 2019-12-20 NOTE — Progress Notes
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 This is scheduled for Feb.           Return in about 4 months (around 04/21/2020) for Return Short, gonio, IOP.    Rita Etienne, MD  [place] [facility]      HPI:  Patient presents with:  Eye Problem: Pt is here today for her 3 month FUV Low-tension glaucoma of both eyes, severe stage  Eye Pain: denises any pain   Vision Change: Vision is stable  Spots and/or Floaters: denises any floaters   FYI: Her brother passes away yesterday and she is still feeling very sad and emotional today was worried about her results on the VF             Exam:  Base Eye Exam     Visual Acuity (Snellen - Linear)       Right Left    Dist cc 20/40 20/30 +2    Dist ph cc NI NI    Correction: Glasses          Tonometry (Tonopen, 1:59 PM)       Right Left    Pressure 14 15          Pupils       Pupils Dark APD    Right PERRL 4 None    Left PERRL 4 None          Visual Fields       Left Right     Full Full          Extraocular Movement       Right Left     Full Full          Neuro/Psych     Oriented x3: Yes    Mood/Affect: Normal            Slit Lamp and Fundus Exam     Slit Lamp Exam       Right Left    Lids/Lashes Normal Normal    Conjunctiva/Sclera White and quiet White and quiet    Cornea Clear Clear    Anterior Chamber Deep and quiet Deep and quiet    Iris Flat Flat    Lens 1+ NS 1+ NS          Fundus Exam       Right Left    Vitreous Normal Normal    Disc notching, no pallor Sharp, healthy rim    C/D Ratio 0.7 0.2            Refraction     Wearing Rx       Sphere Cylinder Axis Add    Right -3.00 +0.50 092 +2.50    Left -4.25 +0.75 112 +2.50    Type: PAL

## 2019-12-27 ENCOUNTER — Encounter: Admit: 2019-12-27 | Discharge: 2019-12-27 | Payer: MEDICARE

## 2019-12-27 MED ORDER — LATANOPROST 0.005 % OP DROP
Freq: Every day | 0 refills
Start: 2019-12-27 — End: ?

## 2020-01-23 ENCOUNTER — Encounter: Admit: 2020-01-23 | Discharge: 2020-01-23 | Payer: MEDICARE

## 2020-02-01 ENCOUNTER — Encounter: Admit: 2020-02-01 | Discharge: 2020-02-01 | Payer: MEDICARE

## 2020-02-04 ENCOUNTER — Encounter: Admit: 2020-02-04 | Discharge: 2020-02-04 | Payer: MEDICARE

## 2020-02-04 ENCOUNTER — Ambulatory Visit: Admit: 2020-02-04 | Discharge: 2020-02-04 | Payer: MEDICARE

## 2020-02-04 DIAGNOSIS — M7918 Myalgia, other site: Secondary | ICD-10-CM

## 2020-02-04 DIAGNOSIS — G43909 Migraine, unspecified, not intractable, without status migrainosus: Secondary | ICD-10-CM

## 2020-02-04 DIAGNOSIS — M5412 Radiculopathy, cervical region: Secondary | ICD-10-CM

## 2020-02-04 DIAGNOSIS — R519 Generalized headaches: Secondary | ICD-10-CM

## 2020-02-04 MED ORDER — IOHEXOL 240 MG IODINE/ML IV SOLN
2.5 mL | Freq: Once | EPIDURAL | 0 refills | Status: CP
Start: 2020-02-04 — End: ?
  Administered 2020-02-04: 19:00:00 2.5 mL via EPIDURAL

## 2020-02-04 MED ORDER — DEXAMETHASONE SODIUM PHOS (PF) 10 MG/ML IJ SOLN
15 mg | Freq: Once | 0 refills | Status: CP
Start: 2020-02-04 — End: ?
  Administered 2020-02-04: 19:00:00 15 mg

## 2020-02-04 NOTE — Patient Instructions
Procedure Completed Today: Cervical Epidural Steroid Injection    Important information following your procedure today: You may drive today    1. Pain relief may not be immediate. It is possible you may even experience an increase in pain during the first 24-48 hours followed by a gradual decrease of your pain.  2. Though the procedure is generally safe and complications are rare, we do ask that you be aware of any of the following:   ? Any swelling, persistent redness, new bleeding, or drainage from the site of the injection.  ? You should not experience a severe headache.  ? You should not run a fever over 101? F.  ? New onset of sharp, severe back & or neck pain.  ? New onset of upper or lower extremity numbness or weakness.  ? New difficulty controlling bowel or bladder function after the injection.  ? New shortness of breath.    If any of these occur, please call to report this occurrence to a nurse at [phone]. If you are calling after 4:00 p.m., on weekends or holidays please call [phone] and ask to have the resident physician on call for the physician paged or go to your local emergency room.  3. You may experience soreness at the injection site. Ice can be applied at 20 minute intervals. Avoid application of direct heat, hot showers or hot tubs today.  4. Avoid strenuous activity today. You may resume your regular activities and exercise tomorrow.  5. Patients with diabetes may see an elevation in blood sugars for 7-10 days after the injection. It is important to pay close attention to your diet, check your blood sugars daily and report extreme elevations to the physician that treats your diabetes.  6. Patients taking a daily blood thinner can resume their regular dose this evening.  7. It is important that you take all medications ordered by your pain physician. Taking medication as ordered is an important part of your pain care plan. If you cannot continue the medication plan, please notify the physician.     Possible side effects to steroids that may occur:  ? Flushing or redness of the face  ? Irritability  ? Fluid retention  ? Change in women?s menses    The following medications were used: Decadron and Contrast Dye

## 2020-02-04 NOTE — Progress Notes

## 2020-02-04 NOTE — Progress Notes
SPINE CENTER  INTERVENTIONAL PAIN PROCEDURE HISTORY AND PHYSICAL    Chief Complaint   Patient presents with   ? Neck - Pain   ? Procedure       HISTORY OF PRESENT ILLNESS:  Neck pain and radicular pain.     Medical History:   Diagnosis Date   ? Generalized headaches    ? Migraines        Surgical History:   Procedure Laterality Date   ? ANKLE SURGERY     ? HEART CATHETERIZATION     ? TUBAL LIGATION         family history includes Arthritis in her mother; Cancer in her father; Cataract in her mother; Glaucoma in her mother.    Social History     Socioeconomic History   ? Marital status: Married     Spouse name: Not on file   ? Number of children: Not on file   ? Years of education: Not on file   ? Highest education level: Some college, no degree   Occupational History   ? Not on file   Tobacco Use   ? Smoking status: Never Smoker   ? Smokeless tobacco: Never Used   Vaping Use   ? Vaping Use: Never used   Substance and Sexual Activity   ? Alcohol use: Not Currently   ? Drug use: Never   ? Sexual activity: Not on file   Other Topics Concern   ? Not on file   Social History Narrative   ? Not on file       Allergies   Allergen Reactions   ? Isometheptene CHEST TIGHTNESS, CHILLS and MUSCLE PAIN   ? Sulfa (Sulfonamide Antibiotics) UNKNOWN       Vitals:    02/04/20 1227 02/04/20 1230   BP: 128/72    BP Source: Arm, Left Upper    Patient Position: Sitting    Pulse: 78    Resp: 14    Temp: 36.8 ?C (98.3 ?F)    SpO2: 98%    Weight: 46.3 kg (102 lb)    Height: 162.6 cm (64)    PainSc: Two Two       REVIEW OF SYSTEMS: 10 point ROS obtained and negative      PHYSICAL EXAM:    General: The patient is a well-developed, well nourished 69 y.o. female in no acute distress.   HEENT: Head is normocephalic and atraumatic.   Pulmonary: The patient has unlabored respirations and bilateral symmetric chest excursion.   Abdomen: Soft, nontender, and nondistended.   Extremities: No clubbing, cyanosis, or edema     Neurologic:   The patient is alert and oriented times 3.      Mild TTP to upper trapezius and levator scapulae with increased muscle tone bilaterally, R>L.    Musculoskeletal:     C-Spine    There is mild paraspinal tenderness.       IMPRESSION:    1. Cervical radiculopathy    2. Myalgia, other site         PLAN: Cervical Epidural Steroid Injection C7-T1    This patient had at least 50% pain relief for at least 3 months with the last epidural injection.

## 2020-02-05 NOTE — Procedures
Attending Surgeon: Evelina Bucy, MD    Anesthesia: Local    Pre-Procedure Diagnosis:   1. Cervical radiculopathy    2. Myalgia, other site        Post-Procedure Diagnosis:   1. Cervical radiculopathy    2. Myalgia, other site        [facility]  Procedure: epidural - interlaminar    Laterality: n/a   on 02/04/2020 12:30 PM  Location: cervical ESI with imaging -  C7-T1      Consent:   Consent obtained: written  Consent given by: patient  Risks discussed: allergic reaction, bleeding, bruising, infection, never damage, no change or worsening in pain, pneumo thorax, reaction to medication, seizure, swelling and weakness  Alternatives discussed: alternative treatment, delayed treatment and no treatment  Discussed with patient the purpose of the treatment/procedure, other ways of treating my condition, including no treatment/ procedure and the risks and benefits of the alternatives. Patient has decided to proceed with treatment/procedure.        Universal Protocol:  Relevant documents: relevant documents present and verified  Site marked: the operative site was marked  Patient identity confirmed: Patient identify confirmed verbally with patient.        Time out: Immediately prior to procedure a time out was called to verify the correct patient, procedure, equipment, support staff and site/side marked as required      Procedures Details:   Indications: pain   Prep: chlorhexidine  Patient position: prone  Estimated Blood Loss: minimal  Specimens: none  Number of Joints: 1  Approach: midline  Guidance: fluoroscopy  Contrast: Procedure confirmed with contrast under live fluoroscopy.  Needle and Epidural Catheter: tuohy  Needle size: 18 G  Injection procedure: Incremental injection and Negative aspiration for blood  Amount Injected:   C7-T1: 4mL  Patient tolerance: Patient tolerated the procedure well with no immediate complications. Pressure was applied, and hemostasis was accomplished.  Outcome: Pain unchanged  Comments: CERVICAL INTERLAMINAR EPIDURAL STEROID INJECTION    PROCEDURE:  1) C7-T1 interlaminar epidural steroid injection  2) Fluoroscopic needle guidance    REASON FOR PROCEDURE: Cervical radiculopathy, DDD (cervical)    ATTENDING PHYSICIAN: Evelina Bucy, MD    MEDICATIONS INJECTED: 2 mL of triamciniolone (80 mg) and 2 mL of sterile, preservative-free normal saline    LOCAL ANESTHETIC INJECTED: 1 mL of 1% lidocaine    SEDATION MEDICATIONS: None    ESTIMATED BLOOD LOSS: None    SPECIMENS REMOVED: None    COMPLICATIONS: None    TECHNIQUE: Time-out was taken to identify the correct patient, procedure and side prior to starting the procedure. With the patient lying in a prone position with the neck in a slightly  flexed position, the area was prepped and draped in sterile fashion using DuraPrep and a fenestrated drape. The area was determined under fluoroscopic guidance. A 27-gauge, 1.25-inch  needle was used to anesthetize the needle entry site and subcutaneous tissues.     The 18-gauge, 3.5-inch Tuohy needle was advanced through the ligamentum flavum using loss of resistance technique. Once the tip of the needle was thought to be in the desired position, contrast was injected to confirm only epidural spread and no vascular runoff via A-P and lateral  views. The injectate was then injected slowly with intermittent negative aspiration.    The procedure was completed without complications and was tolerated well. The patient was monitored after the procedure. The patient (or responsible party) was given post-procedure and  discharge instructions to follow at home.  The patient was discharged in stable condition.    Evelina Bucy, MD, MBA          Estimated blood loss: none or minimal  Specimens: none  Patient tolerated the procedure well with no immediate complications. Pressure was applied, and hemostasis was accomplished.

## 2020-02-29 ENCOUNTER — Encounter: Admit: 2020-02-29 | Discharge: 2020-02-29 | Payer: MEDICARE

## 2020-02-29 IMAGING — CT BRAIN WO(Adult)
3 of 4 series · 14 of 47 positions shown, 16 images · non-contrast
Comparison: none

[Series 4: brain cor 5.00 hr40 s3 · coronal · 0.34mm/px · 3 of 41 slices shown]
[im 14/41  brain]
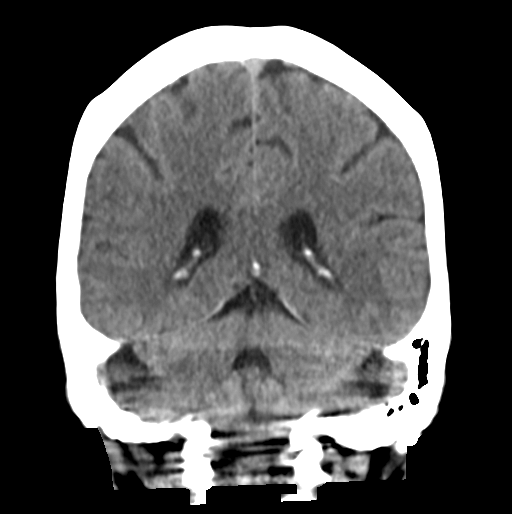
[im 18/41  brain]
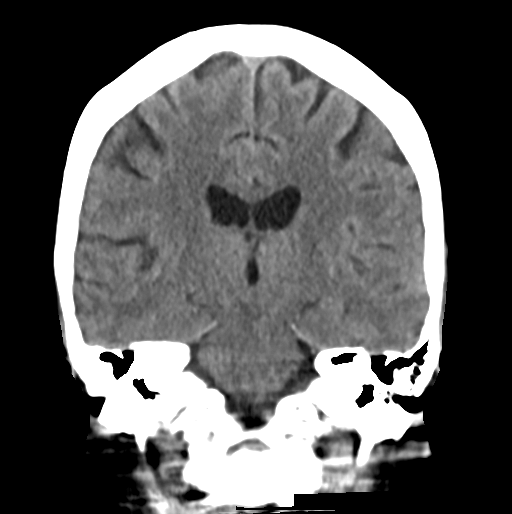
[im 23/41  brain]
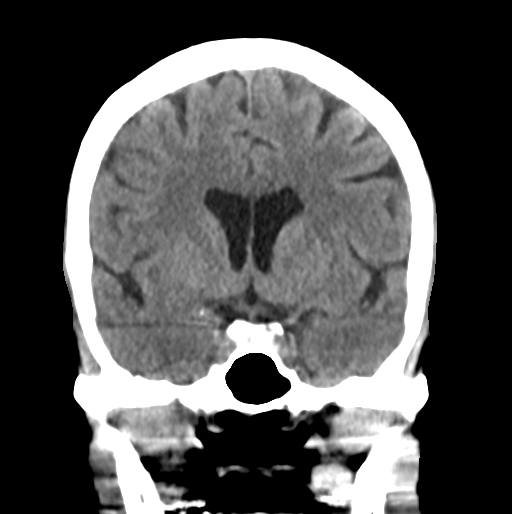

[Series 6: brain sag 5.00 hr40 s3 · sagittal · 0.34mm/px · 3 of 34 slices shown]
[im 12/34  brain]
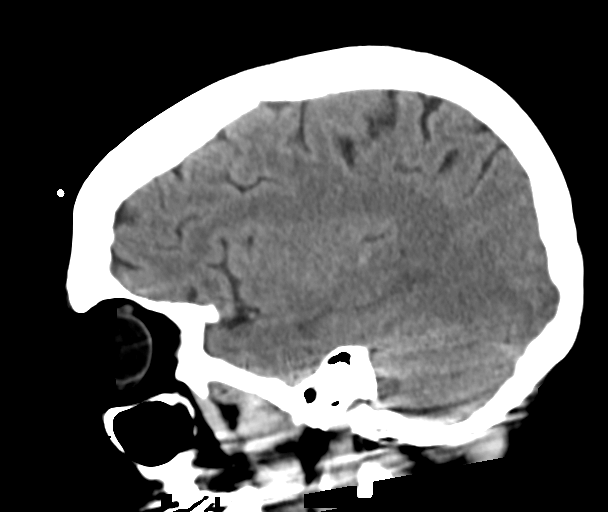
[im 17/34  brain]
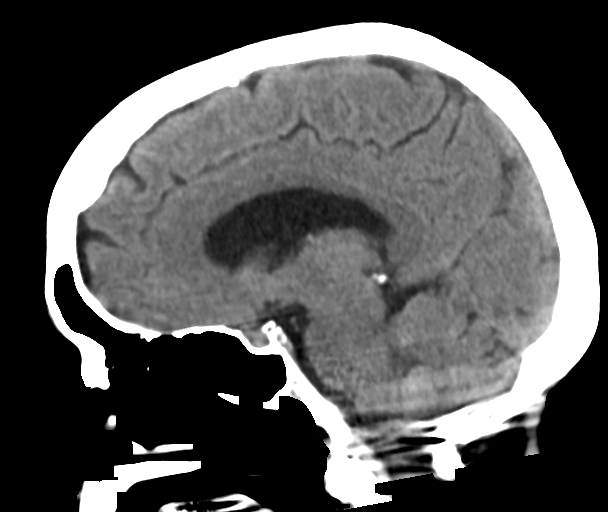
[im 23/34  brain]
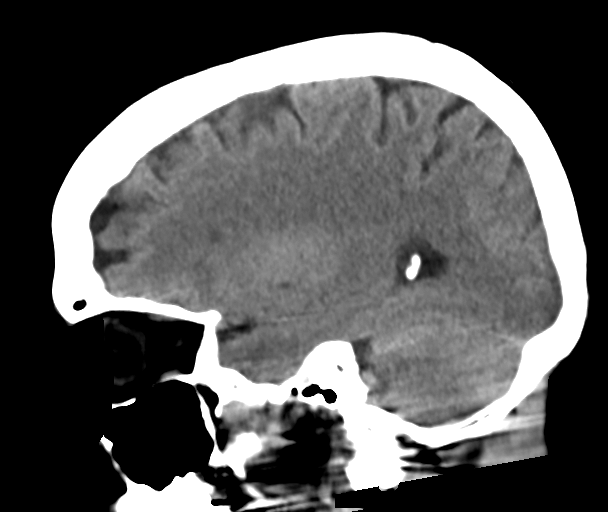

[Series 8: brain ax 2.00 hr60 s3 · axial · 0.34mm/px · z∈[-501,-366]mm · 8 of 87 slices shown, 10 images]
[im 9/87  brain]
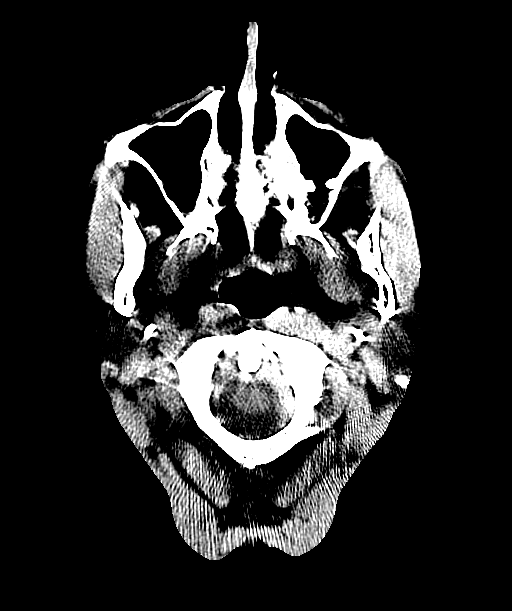
[im 9/87  bone]
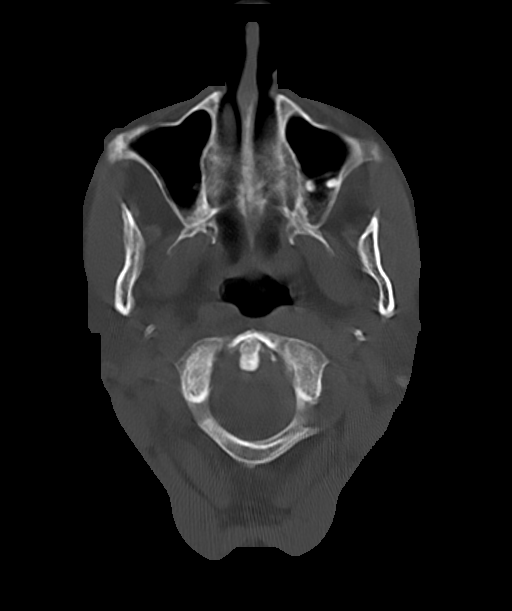
[im 17/87  brain]
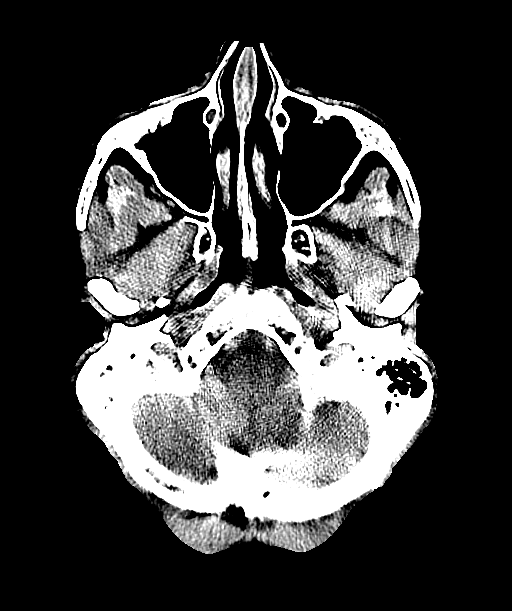
[im 29/87  brain]
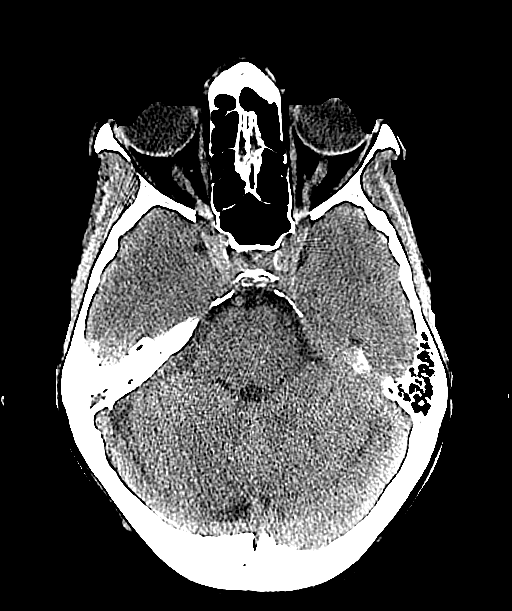
[im 37/87  brain]
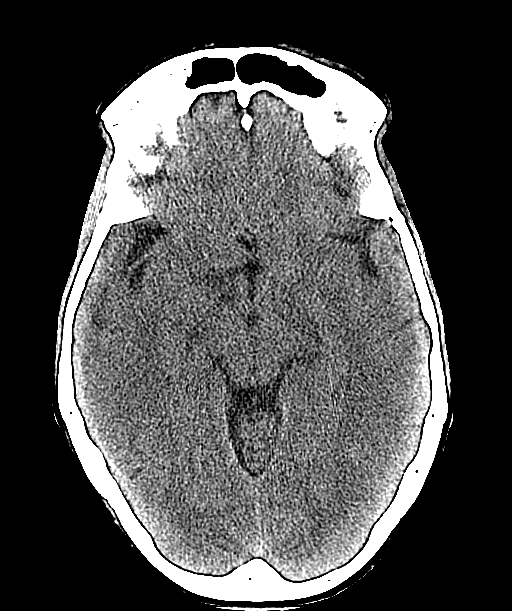
[im 50/87  brain]
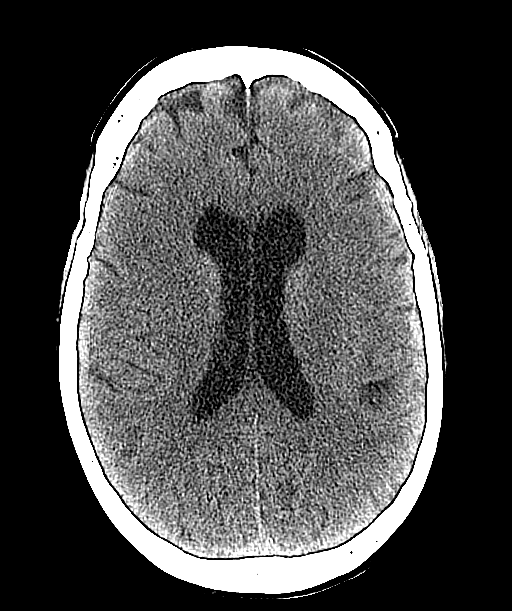
[im 50/87  bone]
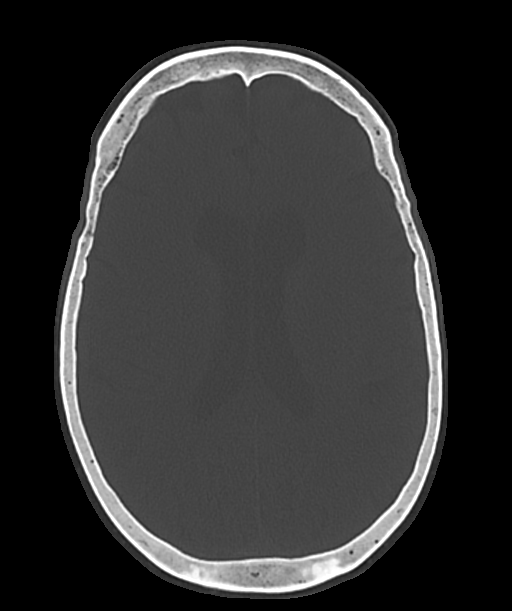
[im 58/87  brain]
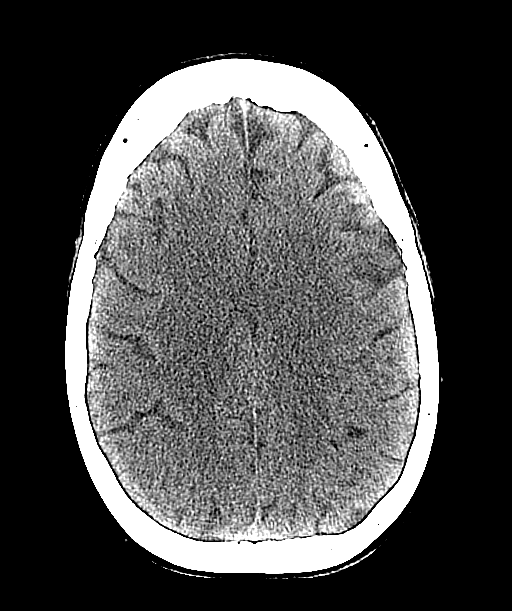
[im 70/87  brain]
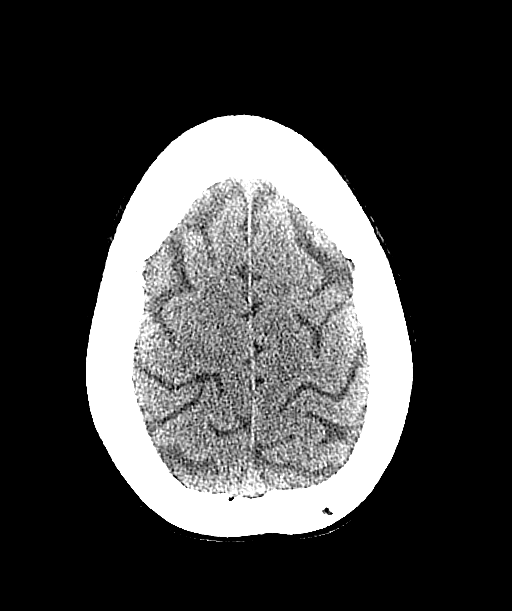
[im 78/87  brain]
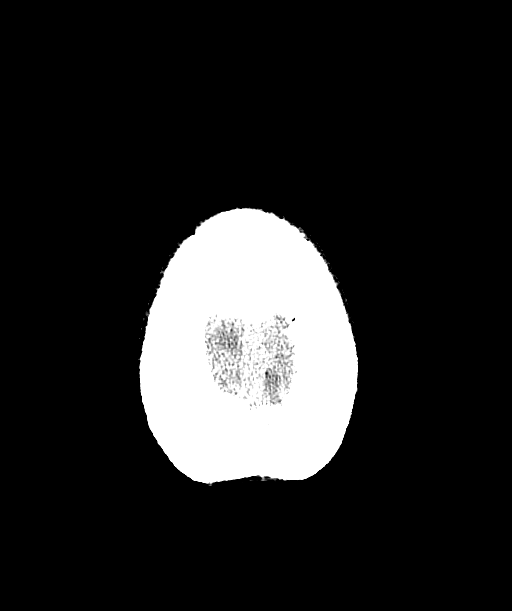

[14 of 47 positions shown; findings below may reference images not displayed]

EXAM

CT HEAD

INDICATION

altered mental status
ALTERED MENTAL STATUS, 1CT/0NM

TECHNIQUE

All CT scans at this facility use dose modulation, iterative reconstruction, and/or weight based
dosing when appropriate to reduce radiation dose to as low as reasonably achievable.

CT of the head without contrast was performed.

# of CT scans in the past year: 1

# of Myocardial perfusion scans this past year: 0

COMPARISONS

None available at the time of dictation

FINDINGS

Parenchyma: No acute hemorrhage.  There is no mass effect, midline shift, or herniation. There is
preservation of the gray white differentiation.

Ventricles / Extra-axial spaces: There is no hydrocephalus.  There are no extra-axial fluid
collections.

Other: The bony structures are intact.  Visualized portions of the paranasal sinuses and mastoid
air cells are clear.

IMPRESSION
1. No CT evidence of an acute intracranial abnormality.

Tech Notes:

ALTERED MENTAL STATUS, 1CT/0NM

## 2020-02-29 IMAGING — MR Head^Brain
6 series · 48 of 48 positions shown · non-contrast
Comparison: none

[Series 2: DWI · axial · 5.0mm · 1.80mm/px · z∈[-73,+76]mm · 14 of 72 slices shown (1 of 4)]
[im 1/72]
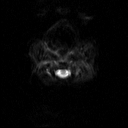
[im 6/72]
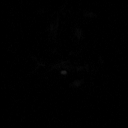
[im 11/72]
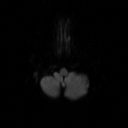
[im 17/72]
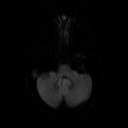
[im 22/72]
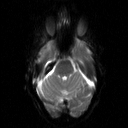
[im 28/72]
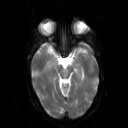
[im 33/72]
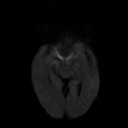
[im 39/72]
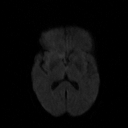
[im 44/72]
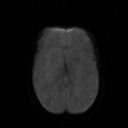
[im 50/72]
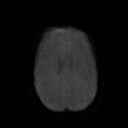
[im 55/72]
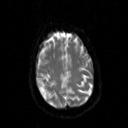
[im 61/72]
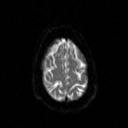
[im 66/72]
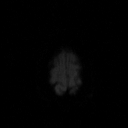
[im 72/72]
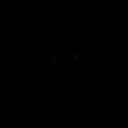

[Series 3: DWI · axial · 5.0mm · 1.80mm/px · z∈[-73,+76]mm · 5 of 24 slices shown (2 of 4)]
[im 1/24]
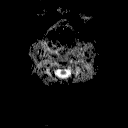
[im 6/24]
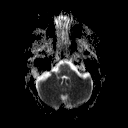
[im 12/24]
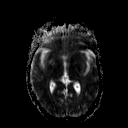
[im 18/24]
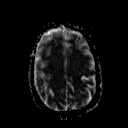
[im 24/24]
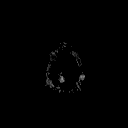

[Series 6: FLAIR · axial · 5.0mm · 0.45mm/px · z∈[-70,+79]mm · 5 of 24 slices shown]
[im 1/24]
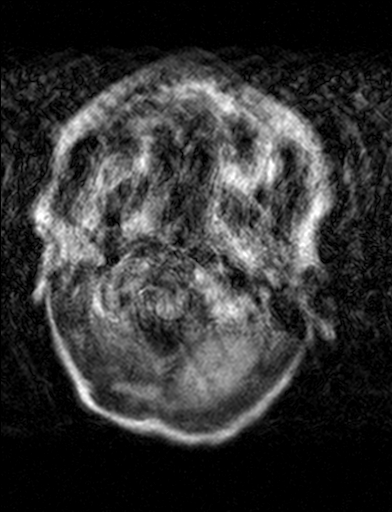
[im 6/24]
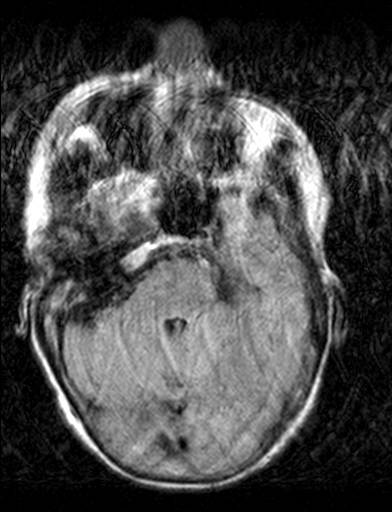
[im 12/24]
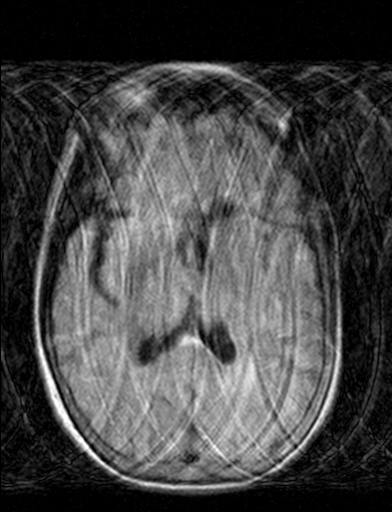
[im 18/24]
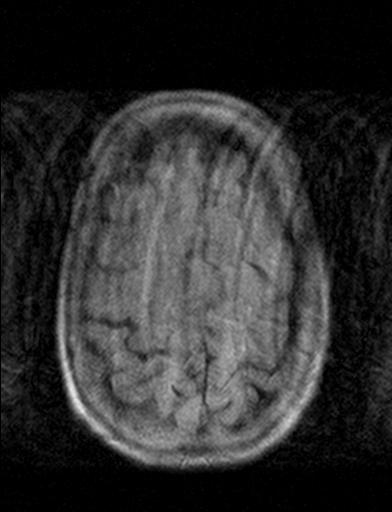
[im 24/24]
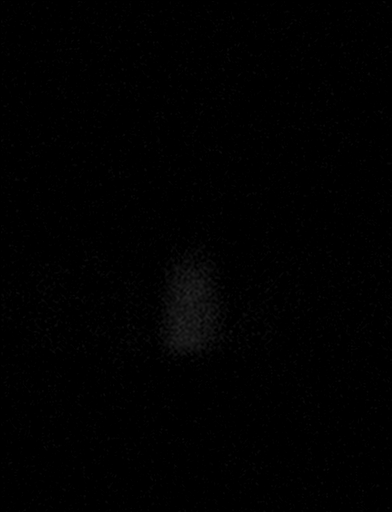

[Series 7: axial blood · axial · 5.0mm · 0.45mm/px · z∈[-73,+76]mm · 5 of 24 slices shown]
[im 1/24]
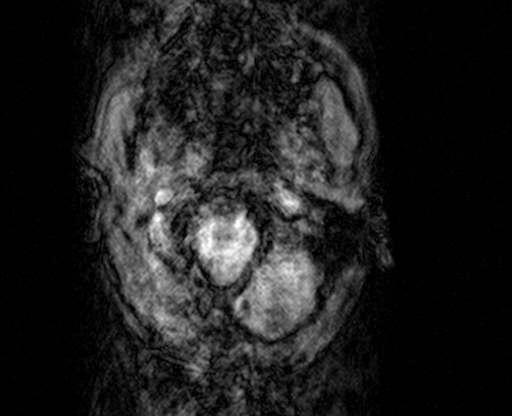
[im 6/24]
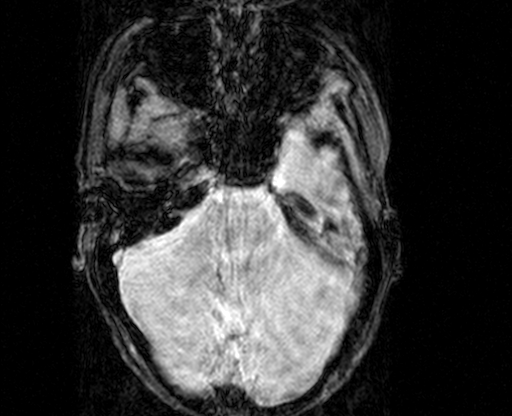
[im 12/24]
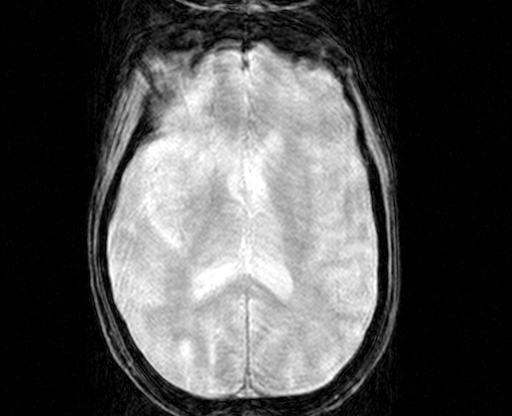
[im 18/24]
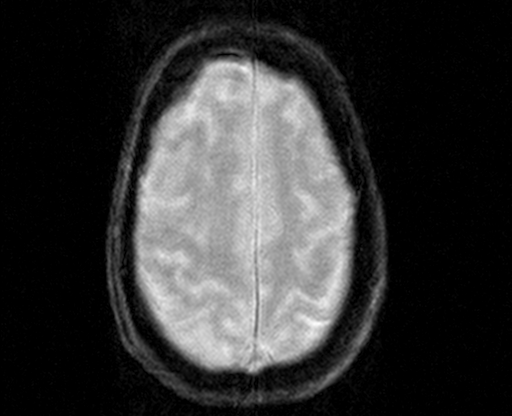
[im 24/24]
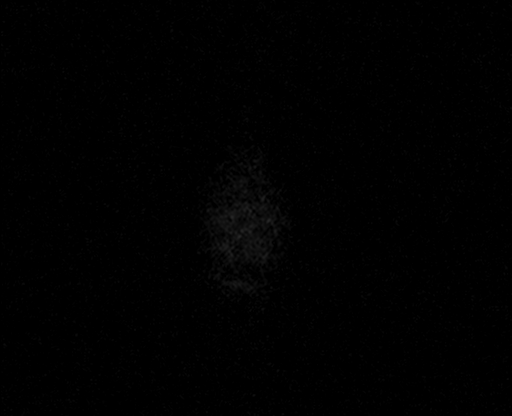

[Series 11: DWI · axial · 5.0mm · 1.80mm/px · z∈[-74,+74]mm · 14 of 72 slices shown (3 of 4)]
[im 1/72]
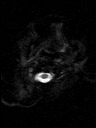
[im 6/72]
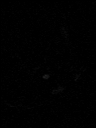
[im 11/72]
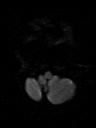
[im 17/72]
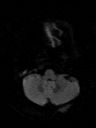
[im 22/72]
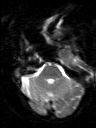
[im 28/72]
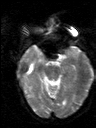
[im 33/72]
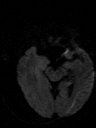
[im 39/72]
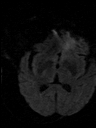
[im 44/72]
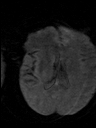
[im 50/72]
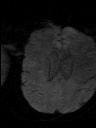
[im 55/72]
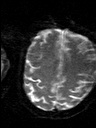
[im 61/72]
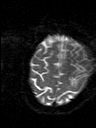
[im 66/72]
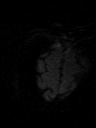
[im 72/72]
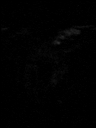

[Series 12: DWI · axial · 5.0mm · 1.80mm/px · z∈[-74,+74]mm · 5 of 24 slices shown (4 of 4)]
[im 1/24]
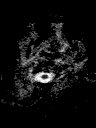
[im 6/24]
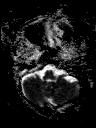
[im 12/24]
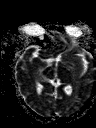
[im 18/24]
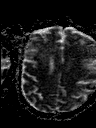
[im 24/24]
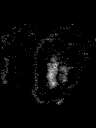

[48 of 48 positions shown; findings below may reference images not displayed]

DIAGNOSTIC STUDIES

EXAM

Brain MRI.

INDICATION

stroke
ALTERED MENTAL STATUS, ONLY ABLE TO OBTAIN LIMITED SERIES DUE TO PT MOTION, REPEATED ALL SEQUENCES
AT LEAST TWICE

TECHNIQUE

Noncontrast brain MRI protocol.

COMPARISONS

No prior studies are available for comparison.

FINDINGS

Motion artifact significantly complicates interpretation.

There is no compelling evidence of diffusion restriction to suggest acute or subacute ischemia. No
gross evidence of intracranial hemorrhage. No midline shift or mass effect. The lateral ventricles
are mildly prominent suggesting volume loss.

IMPRESSION

Motion artifact complicates interpretation. There is no compelling evidence of acute or subacute
ischemia. There is suspicion for volume loss with ventricular prominence.

Tech Notes:

ALTERED MENTAL STATUS, ONLY ABLE TO OBTAIN LIMITED SERIES DUE TO PT MOTION, REPEATED ALL SEQUENCES
AT LEAST TWICE

## 2020-02-29 IMAGING — CR [ID]
1 series · 1 of 1 positions shown · non-contrast
Comparison: none

[chest ap]
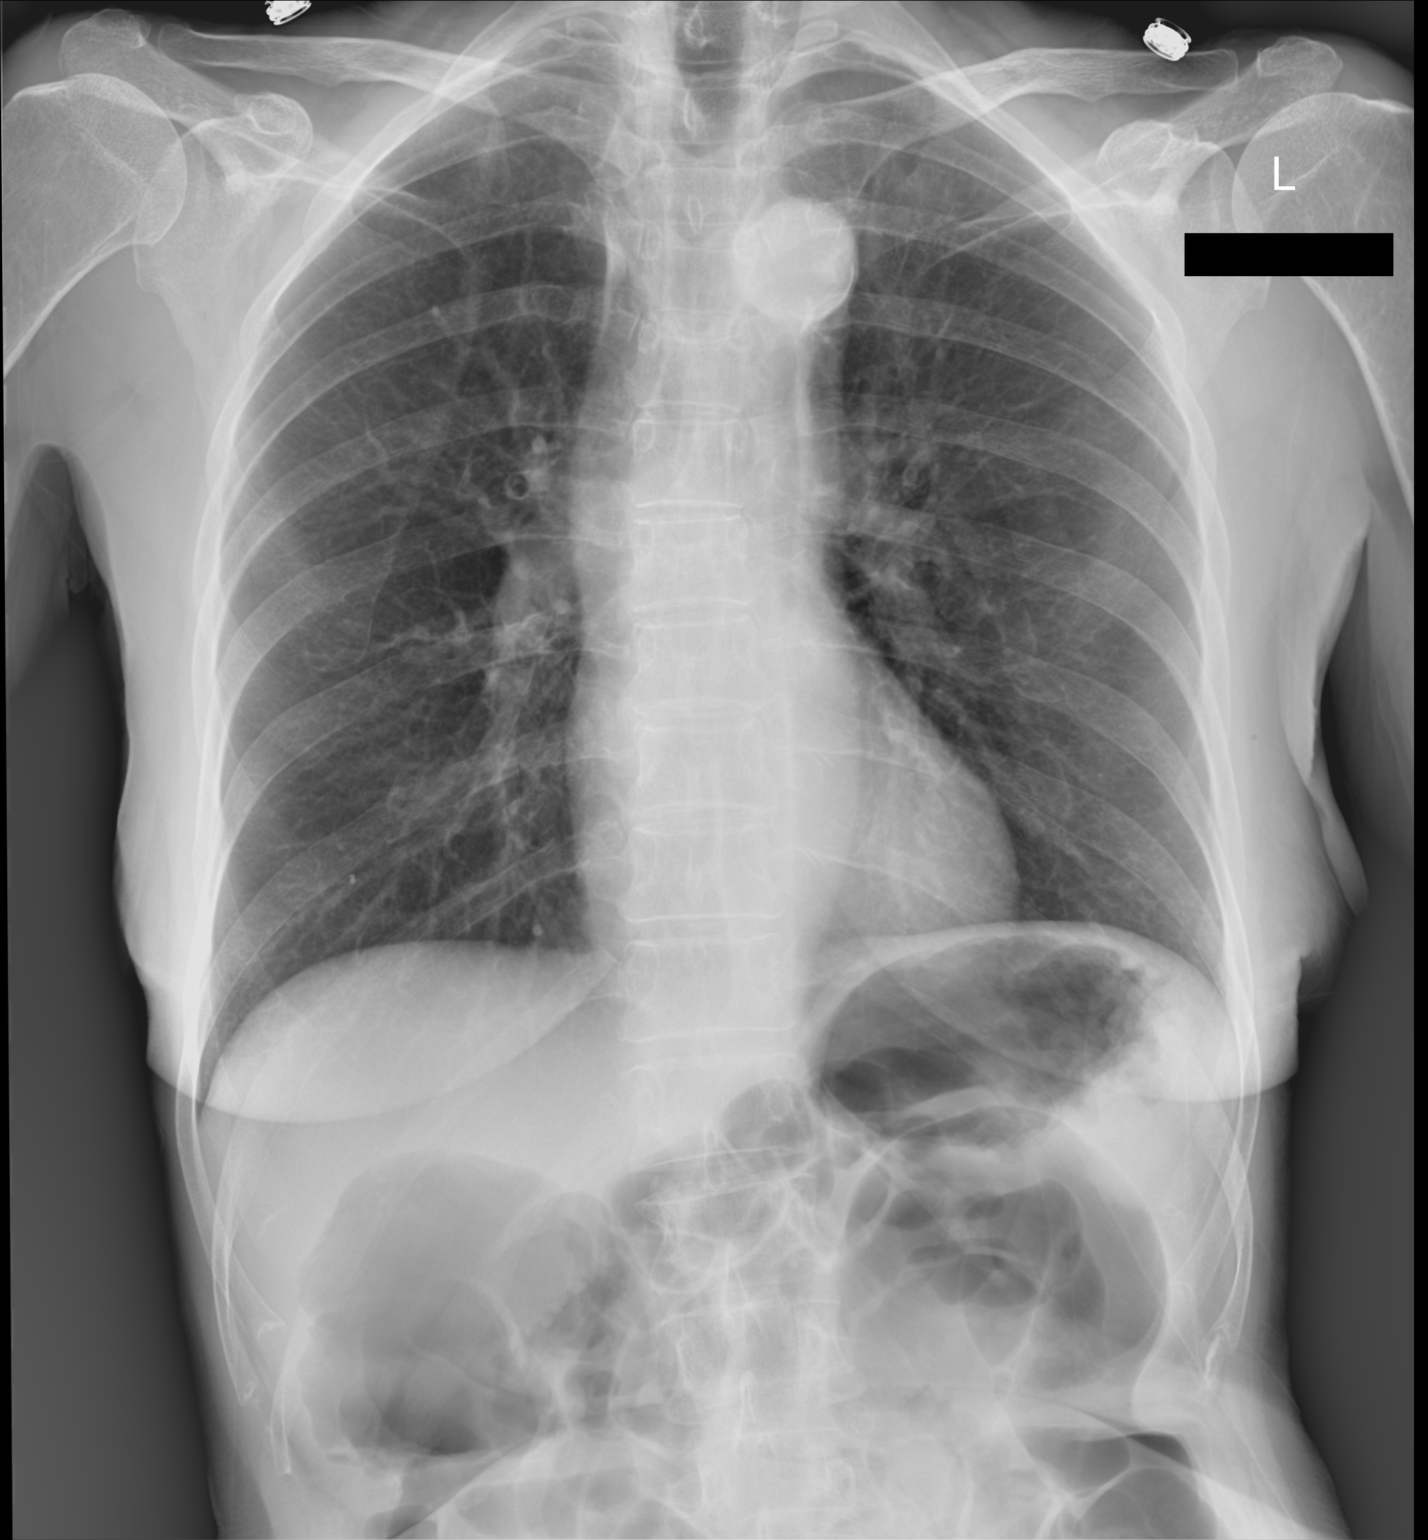

[1 of 1 positions shown; findings below may reference images not displayed]

DIAGNOSTIC STUDIES

EXAM

XR chest 1V

INDICATION

confusion
altered mental status. CF/HB

TECHNIQUE

AP VIEW

COMPARISONS

03/10/2016

FINDINGS

The heart is normal. No focal consolidation, pleural effusion or pneumothorax. The lungs are
overall hyperexpanded. No aggressive osseous lesions.

IMPRESSION

No acute pulmonary findings.

Mild emphysematous change.

Tech Notes:

altered mental status. CF/HB

## 2020-02-29 IMAGING — CT AGHEAD
3 series · 9 of 33 positions shown, 11 images · non-contrast
Comparison: Same day, CT and MRI.
COMPARISON: No relevant prior studies available.

DIAGNOSTIC STUDIES

EXAM:  CT ANGIOGRAPHY NECK WITH INTRAVENOUS CONTRAST  ([id number])
INDICATION: AMS ALTERED MENTAL STATUS, 3XX33 [id number], 1CT/0NM
TECHNIQUE: Axial computed tomographic angiography images of the neck with intravenous contrast.
Sagittal and coronal reformatted images were created and reviewed.
TECHNIQUE: Axial computed tomographic angiography images of the head with intravenous contrast.

[Series 5: cta brain and/or neck ax 3.00 bv36 s3 · axial · 0.38mm/px · z∈[-557,-557]mm · 1 of 115 slices shown, 2 images]
[im 62/115  soft-tissue]
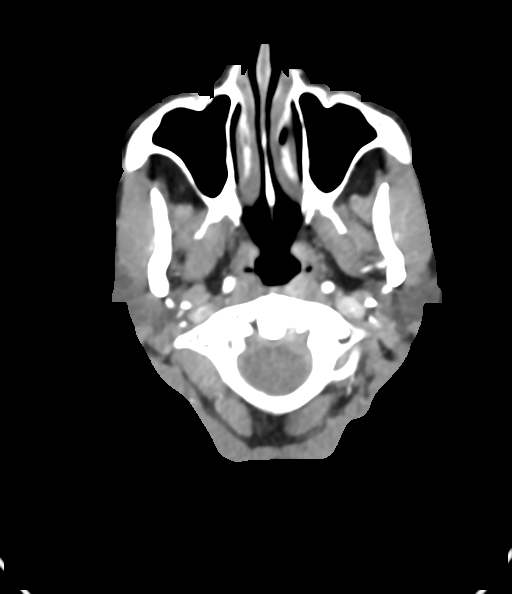
[im 62/115  bone]
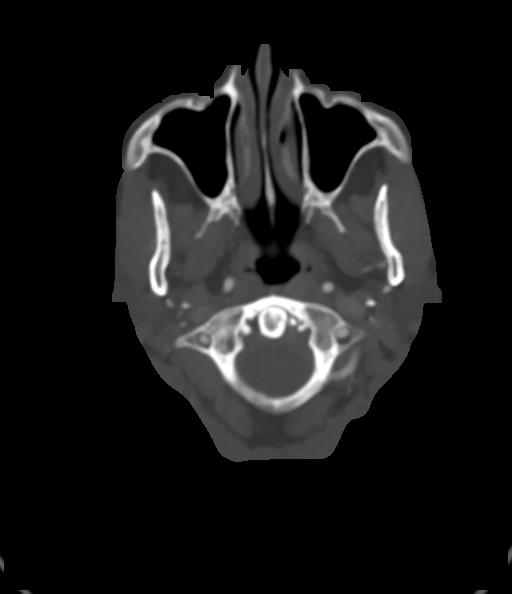

[Series 7: cta brain and/or neck cor 3.00 bv36 s3 · coronal · 0.38mm/px · 3 of 74 slices shown]
[im 15/74  bone]
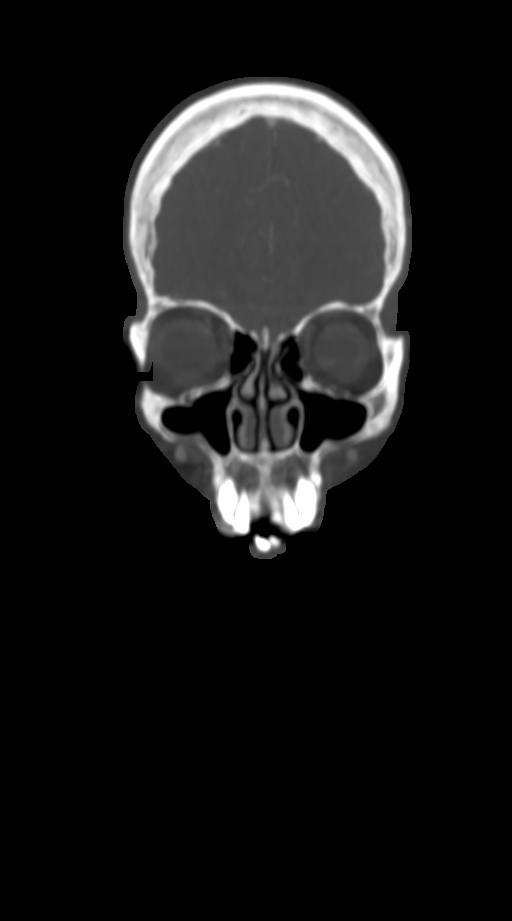
[im 30/74  bone]
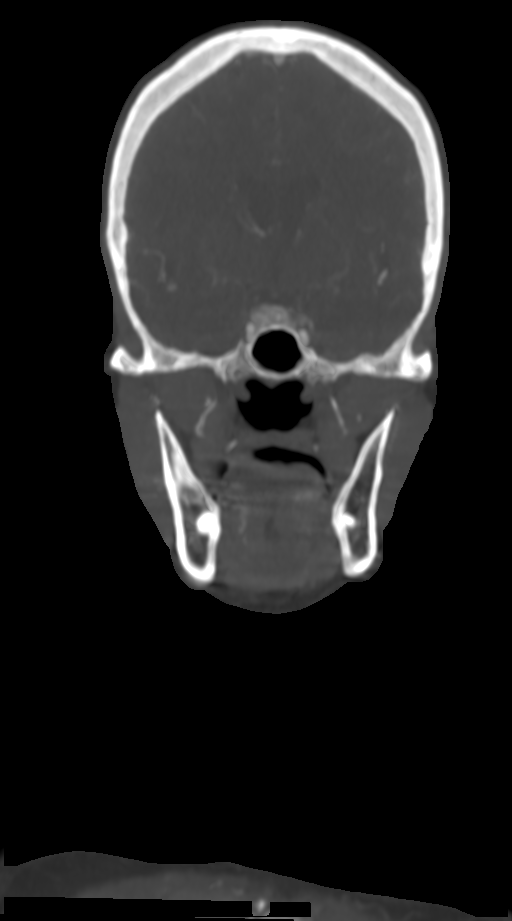
[im 44/74  bone]
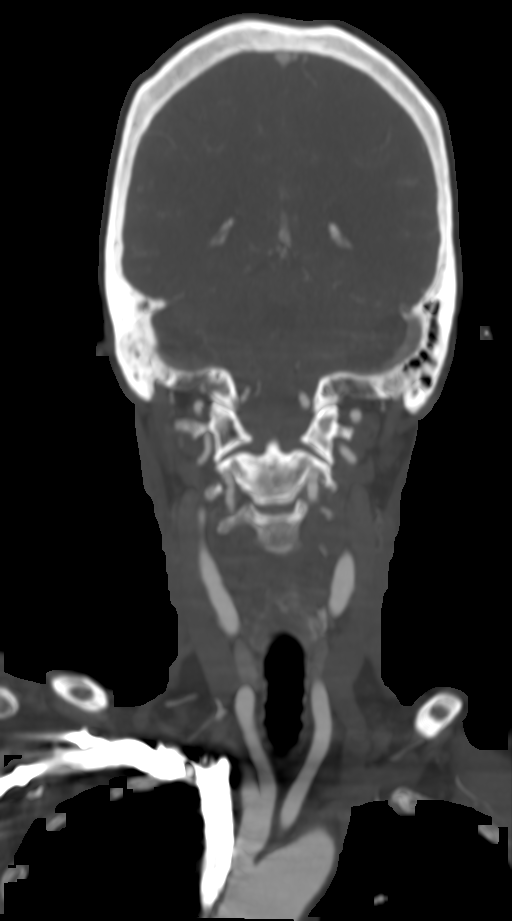

[Series 9: cta brain and/or neck sag 3.00 bv36 s3 · sagittal · 0.44mm/px · 5 of 64 slices shown, 6 images]
[im 22/64  bone]
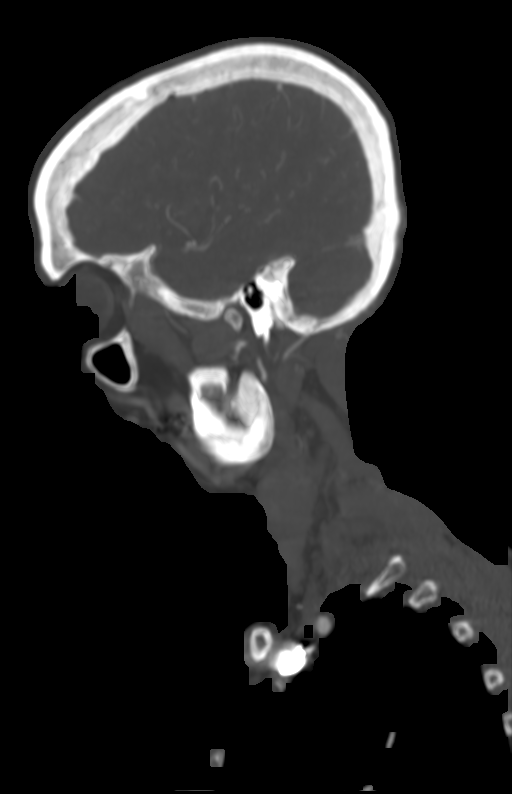
[im 27/64  bone]
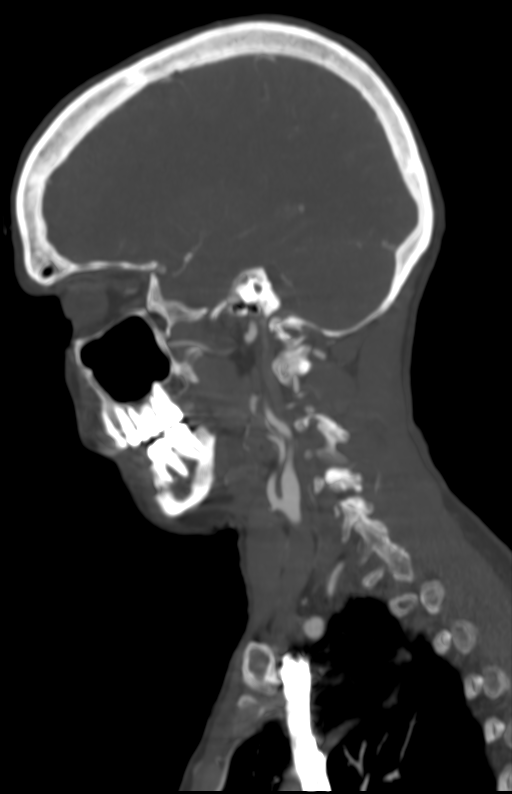
[im 32/64  soft-tissue]
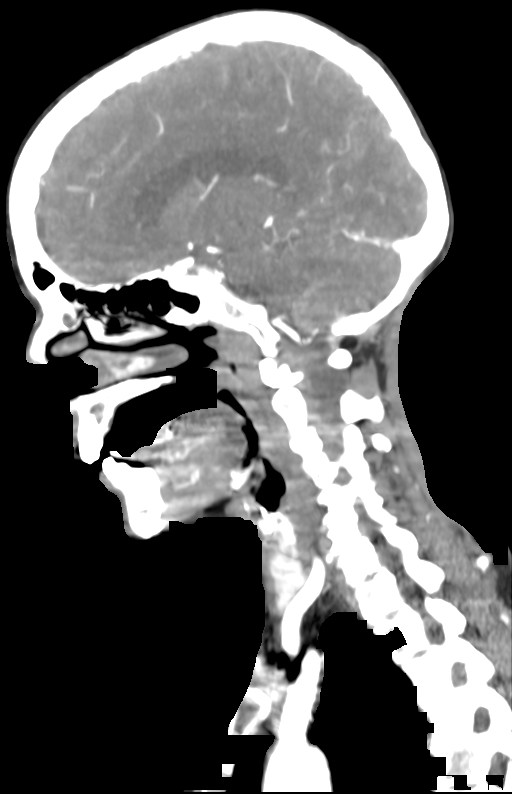
[im 32/64  bone]
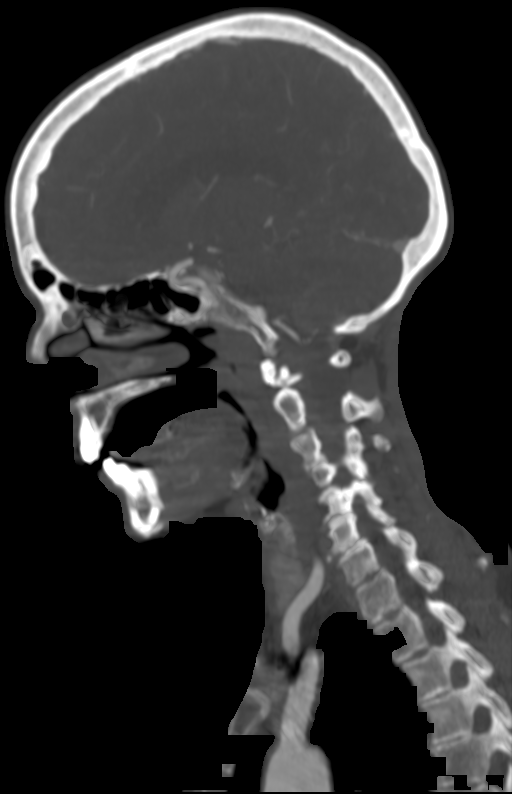
[im 37/64  bone]
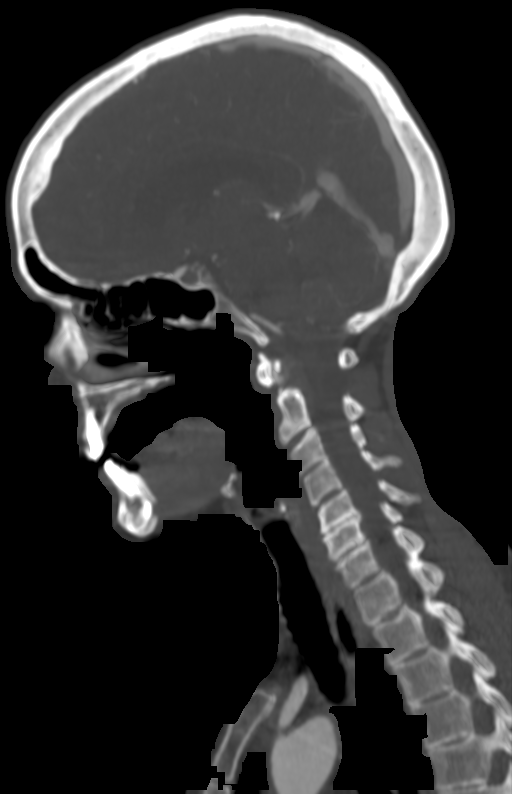
[im 43/64  bone]
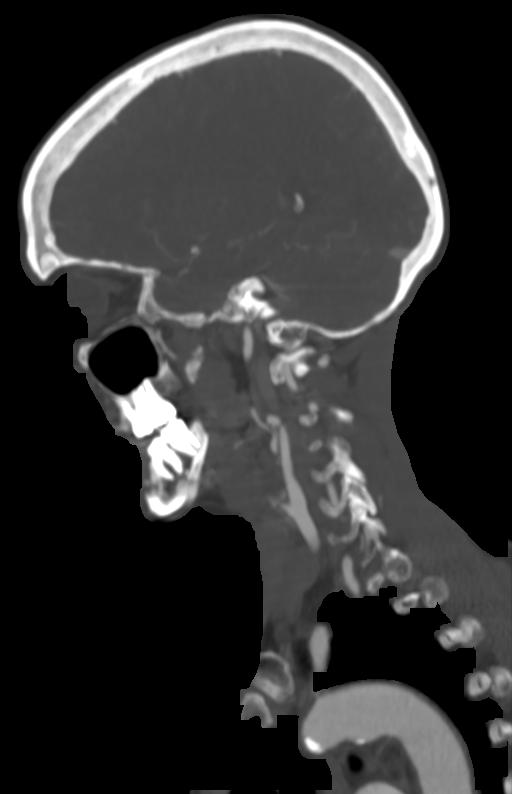

[9 of 33 positions shown; findings below may reference images not displayed]

All CT scans at this facility use dose modulation, interval reconstruction, and/or weight-based
dosing when appropriate to reduce radiation dose to as low as reasonably achievable.

Number of previous computed tomography exams in the last 12 months is 1.  Number of previous
nuclear medicine myocardial perfusion studies in the last 12 months is 0.  3D and MIP reconstructed
images were created and reviewed.

CONTRAST:  100 mL of low osmolar Omnipaque 350 iodinated contrast was intravenously administered.
FINDINGS: RIGHT COMMON CAROTID ARTERY:  NO evidence of significant (greater than 50%) stenosis or occlusion.

RIGHT INTERNAL CAROTID ARTERY:  NO evidence of occlusion or significant stenosis (greater than 50%)
by NASCET criterion.

RIGHT EXTERNAL CAROTID ARTERY:  No definite acute findings.  No occlusion.

RIGHT VERTEBRAL ARTERY:  NO evidence of significant (greater than 50%) stenosis or occlusion.

LEFT COMMON CAROTID ARTERY:  NO evidence of significant (greater than 50%) stenosis or occlusion.

LEFT INTERNAL CAROTID ARTERY:  NO evidence of occlusion or significant stenosis (greater than 50%)
by NASCET criterion.

LEFT EXTERNAL CAROTID ARTERY:  No definite acute findings.  No occlusion.

LEFT VERTEBRAL ARTERY:  No definite acute findings.  No significant stenosis.  No dissection or
occlusion.

AORTA: Visualized portions of the aortic arch demonstrate NO dissection or aneurysm.

BONES/JOINTS:  Moderate degenerative changes in the spine. NO gross fractures. If indicated,
specific examination should be obtained.

SOFT TISSUES:  See below.

RETROPHARYNGEAL SPACE:  The prevertebral/retropharyngeal soft tissues or unremarkable, NO definite
evidence of acute changes.
IMPRESSION: - NO cervical vessel occlusion or significant stenosis (greater than 50%) by NASCET criterion.

_______________________________________________

DIAGNOSTIC STUDIES

EXAM:  CT ANGIOGRAPHY HEAD WITH INTRAVENOUS CONTRAST  ([id number])
All CT scans at this facility use dose modulation, interval reconstruction, and/or weight-based
dosing when appropriate to reduce radiation dose to as low as reasonably achievable.

Number of previous computed tomography exams in the last 12 months is 1.  Number of previous
nuclear medicine myocardial perfusion studies in the last 12 months is 0.  3D and MIP reconstructed
images were created and reviewed.

CONTRAST:  100 mL of low osmolar Omnipaque 350 iodinated contrast was intravenously administered.
FINDINGS: RIGHT INTERNAL CAROTID ARTERY:  RIGHT ICA demonstrates NO evidence of significant (greater than 50
percent) stenosis, aneurysm or occlusion.

RIGHT ANTERIOR, MIDDLE AND POSTERIOR CEREBRAL ARTERIES: NO evidence of significant (greater than 50
percent) stenosis, occlusion, aneurysm or malformation.

LEFT INTERNAL CAROTID ARTERY:  LEFT ICA demonstrates NO evidence of significant (greater than 50
percent) stenosis, aneurysm or occlusion.

LEFT ANTERIOR, MIDDLE AND POSTERIOR CEREBRAL ARTERIES: NO evidence of significant (greater than 50
percent) stenosis, occlusion, aneurysm or malformation.

BASILAR ARTERY:  NO significant (greater than 50%) stenosis or occlusion, aneurysm or vascular
malformation.

DURAL SINUSES/CEREBRAL VEINS:  Unremarkable as visualized.
IMPRESSION: - The visualized portions of the intracranial arteries demonstrate NO intracranial major vessel
occlusion, significant (greater than 50%) stenosis, aneurysm or vascular malformation.

Tech Notes:

ALTERED MENTAL STATUS, 3XX33 [id number], 1CT/0NM

## 2020-03-01 ENCOUNTER — Inpatient Hospital Stay: Admit: 2020-03-01 | Discharge: 2020-03-01 | Payer: MEDICARE

## 2020-03-01 ENCOUNTER — Inpatient Hospital Stay: Admit: 2020-03-01 | Payer: MEDICARE

## 2020-03-01 ENCOUNTER — Encounter: Admit: 2020-03-01 | Discharge: 2020-03-01 | Payer: MEDICARE

## 2020-03-01 MED ADMIN — CLONAZEPAM 1 MG PO TAB [9638]: 1 mg | ORAL | @ 09:00:00 | Stop: 2020-03-01 | NDC 60687055511

## 2020-03-01 MED ADMIN — MAGNESIUM OXIDE 400 MG (241.3 MG MAGNESIUM) PO TAB [10491]: 400 mg | ORAL | @ 16:00:00 | NDC 10006070028

## 2020-03-01 MED ADMIN — BUPROPION XL 150 MG PO TB24 [88619]: 300 mg | ORAL | @ 16:00:00 | NDC 50268014011

## 2020-03-01 MED ADMIN — LATANOPROST 0.005 % OP DROP [77052]: 1 [drp] | OPHTHALMIC | @ 09:00:00 | NDC 61314054701

## 2020-03-01 MED ADMIN — BACLOFEN 10 MG PO TAB [860]: 10 mg | ORAL | @ 22:00:00 | NDC 00904647561

## 2020-03-01 MED ADMIN — BUPROPION XL 150 MG PO TB24 [88619]: 150 mg | ORAL | @ 09:00:00 | Stop: 2020-03-01 | NDC 50268014011

## 2020-03-01 MED ADMIN — GABAPENTIN 300 MG PO CAP [18308]: 300 mg | ORAL | @ 22:00:00 | NDC 60687059111

## 2020-03-01 MED ADMIN — ROSUVASTATIN 10 MG PO TAB [88503]: 5 mg | ORAL | @ 09:00:00 | NDC 00904677961

## 2020-03-01 MED ADMIN — CALCIUM CARBONATE 500 MG CALCIUM (1,250 MG) PO TAB [1300]: 1250 mg | ORAL | @ 16:00:00 | NDC 00904188361

## 2020-03-01 MED ADMIN — GABAPENTIN 300 MG PO CAP [18308]: 300 mg | ORAL | @ 16:00:00 | NDC 60687059111

## 2020-03-01 MED ADMIN — BACLOFEN 10 MG PO TAB [860]: 10 mg | ORAL | @ 16:00:00 | NDC 00904647561

## 2020-03-02 MED ADMIN — GABAPENTIN 300 MG PO CAP [18308]: 300 mg | ORAL | @ 04:00:00 | NDC 60687059111

## 2020-03-02 MED ADMIN — BUPROPION XL 150 MG PO TB24 [88619]: 300 mg | ORAL | @ 14:00:00 | Stop: 2020-03-02 | NDC 50268014011

## 2020-03-02 MED ADMIN — BACLOFEN 10 MG PO TAB [860]: 10 mg | ORAL | @ 04:00:00 | NDC 00904647561

## 2020-03-02 MED ADMIN — BACLOFEN 10 MG PO TAB [860]: 10 mg | ORAL | @ 14:00:00 | Stop: 2020-03-02 | NDC 00904647561

## 2020-03-02 MED ADMIN — PANTOPRAZOLE 40 MG PO TBEC [80436]: 40 mg | ORAL | @ 04:00:00 | NDC 00904647461

## 2020-03-02 MED ADMIN — MAGNESIUM OXIDE 400 MG (241.3 MG MAGNESIUM) PO TAB [10491]: 400 mg | ORAL | @ 14:00:00 | Stop: 2020-03-02 | NDC 10006070028

## 2020-03-02 MED ADMIN — ACETAMINOPHEN 325 MG PO TAB [101]: 325 mg | ORAL | @ 13:00:00 | Stop: 2020-03-02 | NDC 00904677361

## 2020-03-02 MED ADMIN — ROSUVASTATIN 10 MG PO TAB [88503]: 5 mg | ORAL | @ 04:00:00 | NDC 00904677961

## 2020-03-02 MED ADMIN — MELATONIN 3 MG PO TAB [16830]: 3 mg | ORAL | @ 04:00:00 | NDC 50268052411

## 2020-03-02 MED ADMIN — CALCIUM CARBONATE 500 MG CALCIUM (1,250 MG) PO TAB [1300]: 1250 mg | ORAL | @ 14:00:00 | Stop: 2020-03-02 | NDC 00904188361

## 2020-03-02 MED ADMIN — MAGNESIUM OXIDE 400 MG (241.3 MG MAGNESIUM) PO TAB [10491]: 400 mg | ORAL | @ 04:00:00 | NDC 10006070028

## 2020-03-12 ENCOUNTER — Encounter: Admit: 2020-03-12 | Discharge: 2020-03-12 | Payer: MEDICARE

## 2020-03-17 ENCOUNTER — Encounter: Admit: 2020-03-17 | Discharge: 2020-03-17 | Payer: MEDICARE

## 2020-03-17 ENCOUNTER — Ambulatory Visit: Admit: 2020-03-17 | Discharge: 2020-03-17 | Payer: MEDICARE

## 2020-03-17 DIAGNOSIS — M7918 Myalgia, other site: Secondary | ICD-10-CM

## 2020-03-17 DIAGNOSIS — R519 Generalized headaches: Secondary | ICD-10-CM

## 2020-03-17 DIAGNOSIS — M5412 Radiculopathy, cervical region: Secondary | ICD-10-CM

## 2020-03-17 DIAGNOSIS — G43909 Migraine, unspecified, not intractable, without status migrainosus: Secondary | ICD-10-CM

## 2020-03-17 MED ORDER — LIDOCAINE (PF) 20 MG/ML (2 %) IJ SOLN
5 mL | Freq: Once | INTRAMUSCULAR | 0 refills | Status: CP | PRN
Start: 2020-03-17 — End: ?
  Administered 2020-03-17: 15:00:00 5 mL via INTRAMUSCULAR

## 2020-03-17 MED ORDER — BUPIVACAINE (PF) 0.25 % (2.5 MG/ML) IJ SOLN
5 mL | Freq: Once | INTRAMUSCULAR | 0 refills | Status: CP | PRN
Start: 2020-03-17 — End: ?
  Administered 2020-03-17: 15:00:00 5 mL via INTRAMUSCULAR

## 2020-03-17 NOTE — Procedures
[facility] TRIGGER POINT INJECTION  Locations: L upper trapezius, R upper trapezius, L levator scapulae, R levator scapulae, L thoracic, R thoracic, L cervical and R cervical  Consent:   Consent obtained: written and verbal  Consent given by: patient  Alternatives discussed: alternative treatment  Discussed with patient the purpose of the treatment/procedure, other ways of treating my condition, including no treatment/ procedure and the risks and benefits of the alternatives. Patient has decided to proceed with treatment/procedure.        Universal Protocol:  Relevant documents: relevant documents present and verified  Test results: test results available and properly labeled  Imaging studies: imaging studies not available  Required items: required blood products, implants, devices, and special equipment not available  Site marked: the operative site was not marked  Patient identity confirmed: Patient identify confirmed verbally with patient.        Time out: Immediately prior to procedure a "time out" was called to verify the correct patient, procedure, equipment, support staff and site/side marked as required      Procedures Details:   Indications: myalgiaPrep: alcohol  Needle size: 27 G  Number of muscles: 3 or more  Approach: posterior  Medications administered: 5 mL bupivacaine PF 0.25 %; 5 mL lidocaine PF 20 mg/mL (2 %)  Patient tolerance: Patient tolerated the procedure well with no immediate complications. Pressure was applied, and hemostasis was accomplished.

## 2020-03-25 ENCOUNTER — Encounter: Admit: 2020-03-25 | Discharge: 2020-03-25 | Payer: MEDICARE

## 2020-04-10 ENCOUNTER — Encounter

## 2020-04-10 DIAGNOSIS — Z1231 Encounter for screening mammogram for malignant neoplasm of breast: Secondary | ICD-10-CM

## 2020-04-14 ENCOUNTER — Encounter

## 2020-04-14 DIAGNOSIS — G43909 Migraine, unspecified, not intractable, without status migrainosus: Secondary | ICD-10-CM

## 2020-04-14 DIAGNOSIS — H401233 Low-tension glaucoma, bilateral, severe stage: Secondary | ICD-10-CM

## 2020-04-14 DIAGNOSIS — R519 Generalized headaches: Secondary | ICD-10-CM

## 2020-04-14 NOTE — Progress Notes
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 It was recommended that she receive a formal driving assessment - Patient and her husband report that she is getting this done locally.    Renae Fickle, MD  PGY-4, [facility]           Return in about 6 months (around 10/12/2020) for VA, OCT-nerve.      HPI:  Patient presents with:  Glaucoma: Follow up; H/o LTG OU. She denies any changes in vision. She denies any pain or discomfort. H/o optic neuropathy OD. She is compliant with her drops.  Treatment: Latanoprost QHS        Exam:  Base Eye Exam     Visual Acuity (Snellen - Linear)       Right Left    Dist cc 20/40 +2 20/30 +1    Correction: Glasses          Tonometry (Tonopen, 8:32 AM)       Right Left    Pressure 9 10          Tonometry #2 (Applanation, 9:02 AM)       Right Left    Pressure 11 12          Gonioscopy       Right Left    Temporal Gr 3-4 Gr 4    Nasal Gr 3-4 Gr 4    Superior Gr 3-4 Gr 4    Inferior Gr 4 Gr 4   Open 360* OU           Pupils       Pupils Dark Shape React APD    Right PERRL 5 Round Minimal None    Left PERRL 5  Minimal None          Visual Fields       Left Right     Full Full          Extraocular Movement       Right Left     Full Full          Neuro/Psych     Oriented x3: Yes    Mood/Affect: Normal            Slit Lamp and Fundus Exam     Slit Lamp Exam       Right Left    Lids/Lashes Normal Normal    Conjunctiva/Sclera White and quiet White and quiet    Cornea Clear Clear    Anterior Chamber Deep and quiet Deep and quiet    Iris Flat Flat Lens 1+ NS 1+ NS    Vitreous Normal Normal          Fundus Exam       Right Left    Disc notching, no pallor Sharp, healthy rim    C/D Ratio 0.7 0.2            Refraction  Wearing Rx       Sphere Cylinder Axis Add    Right -3.00 +0.50 092 +2.50    Left -4.25 +0.75 112 +2.50    Type: PAL          Manifest Refraction       Sphere Cylinder Axis Dist VA Add    Right -4.25 +1.25 090 20/40+2 +2.50    Left -4.00 +1.00 095 20/25 +2.50

## 2020-04-14 NOTE — Progress Notes
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 It was recommended that she receive a formal driving assessment - Patient and her husband report that she is getting this done locally.    Signed DL form that vision is adequate to drive, but recommended driving assessment at local [employer]           Return in about 6 months (around 10/12/2020) for VA, OCT-nerve, MRx.    Madelaine Etienne, MD  [facility]      HPI:  Patient presents with:  Glaucoma: Follow up; H/o LTG OU. She denies any changes in vision. She denies any pain or discomfort. H/o optic neuropathy OD. She is compliant with her drops.  Treatment: Latanoprost QHS          Exam:  Base Eye Exam     Visual Acuity (Snellen - Linear)       Right Left    Dist cc 20/40 +2 20/30 +1    Correction: Glasses          Tonometry (Tonopen, 8:32 AM)       Right Left    Pressure 9 10          Tonometry #2 (Applanation, 9:02 AM)       Right Left    Pressure 11 12          Gonioscopy       Right Left    Temporal Gr 3-4 Gr 4    Nasal Gr 3-4 Gr 4    Superior Gr 3-4 Gr 4    Inferior Gr 4 Gr 4   Open 360* OU           Pupils       Pupils Dark Shape React APD    Right PERRL 5 Round Minimal None    Left PERRL 5  Minimal None          Visual Fields       Left Right     Full Full          Extraocular Movement       Right Left     Full Full          Neuro/Psych     Oriented x3: Yes    Mood/Affect: Normal            Slit Lamp and Fundus Exam     Slit Lamp Exam       Right Left    Lids/Lashes Normal Normal    Conjunctiva/Sclera White and quiet White and quiet    Cornea Clear Clear    Anterior Chamber Deep and quiet Deep and quiet    Iris Flat Flat    Lens 1+ NS 1+ NS          Fundus Exam       Right Left    Vitreous Normal Normal    Disc notching, no pallor  Sharp, healthy rim    C/D Ratio 0.7 0.2            Refraction     Wearing Rx       Sphere Cylinder Axis Add    Right -3.00 +0.50 092 +2.50    Left -4.25 +0.75 112 +2.50    Type: PAL          Manifest Refraction       Sphere Cylinder Axis Dist VA Add    Right -4.25 +1.25 090 20/40+2 +2.50    Left -4.00 +1.00 095 20/25 +2.50

## 2020-04-15 ENCOUNTER — Encounter: Admit: 2020-04-15 | Discharge: 2020-04-15 | Payer: MEDICARE

## 2020-04-28 ENCOUNTER — Encounter: Admit: 2020-04-28 | Discharge: 2020-04-28 | Payer: MEDICARE

## 2020-04-29 ENCOUNTER — Encounter: Admit: 2020-04-29 | Discharge: 2020-04-29 | Payer: MEDICARE

## 2020-04-29 NOTE — Telephone Encounter
Pt calling triage, she had Dr Sharlette Dense fill out a form for her from the [employer] [place], There was a box that was not checked and all boxes need to be marked. It is for horizontal field of vision. I believe they sent a form and if you can mark that and get it back to them soon, Pt is under a time line of a day or two then she has to start over with doctors. The fax number should be on the form. Will you please call her back.

## 2020-05-01 ENCOUNTER — Encounter: Admit: 2020-05-01 | Discharge: 2020-05-01 | Payer: MEDICARE

## 2020-05-01 DIAGNOSIS — Z1231 Encounter for screening mammogram for malignant neoplasm of breast: Secondary | ICD-10-CM

## 2020-05-01 NOTE — Telephone Encounter
[employer] [place] following up on previous message,  There was a box that was not checked and all boxes need to be marked. It is for horizontal field of vision. I believe they sent a form and if you can mark that and get it back to them soon, Pt is under a time line of a day or two then she has to start over with doctors. The fax number should be on the form. Will you please call her back.     Fax is in folder right fax

## 2020-05-05 ENCOUNTER — Encounter: Admit: 2020-05-05 | Discharge: 2020-05-05 | Payer: MEDICARE

## 2020-05-05 ENCOUNTER — Ambulatory Visit: Admit: 2020-05-05 | Discharge: 2020-05-05 | Payer: MEDICARE

## 2020-05-05 DIAGNOSIS — Z1231 Encounter for screening mammogram for malignant neoplasm of breast: Secondary | ICD-10-CM

## 2020-05-05 DIAGNOSIS — M7918 Myalgia, other site: Secondary | ICD-10-CM

## 2020-05-05 DIAGNOSIS — G43909 Migraine, unspecified, not intractable, without status migrainosus: Secondary | ICD-10-CM

## 2020-05-05 DIAGNOSIS — M5030 Other cervical disc degeneration, unspecified cervical region: Secondary | ICD-10-CM

## 2020-05-05 DIAGNOSIS — M5412 Radiculopathy, cervical region: Secondary | ICD-10-CM

## 2020-05-05 DIAGNOSIS — R519 Generalized headaches: Secondary | ICD-10-CM

## 2020-05-05 MED ORDER — TRIAMCINOLONE ACETONIDE 40 MG/ML IJ SUSP
80 mg | Freq: Once | EPIDURAL | 0 refills | Status: CP
Start: 2020-05-05 — End: ?
  Administered 2020-05-05: 18:00:00 80 mg via EPIDURAL

## 2020-05-05 MED ORDER — IOHEXOL 240 MG IODINE/ML IV SOLN
2.5 mL | Freq: Once | EPIDURAL | 0 refills | Status: CP
Start: 2020-05-05 — End: ?
  Administered 2020-05-05: 18:00:00 2.5 mL via EPIDURAL

## 2020-05-05 NOTE — Progress Notes
[facility]  INTERVENTIONAL PAIN PROCEDURE HISTORY AND PHYSICAL    Chief complaint: I'm here for my pain injection     HISTORY OF PRESENT ILLNESS:  Patient returns today for interventional treatment of Neck pain. Patient denies any recent fevers, chills, infection, antibiotics, coagulopathy or contra-indicated anticoagulants. Risks of the procedure were discussed including but not limited to bleeding, infection, damage to surrounding structures and reaction to medications. Patient reports understanding and has elected to proceed with the procedure.       Medical History:   Diagnosis Date   ? Generalized headaches    ? Migraines        Surgical History:   Procedure Laterality Date   ? ANKLE SURGERY     ? HEART CATHETERIZATION     ? TUBAL LIGATION         family history includes Arthritis in her mother; Cancer in her father; Cataract in her mother; Glaucoma in her mother.    Social History     Socioeconomic History   ? Marital status: Married     Spouse name: Not on file   ? Number of children: Not on file   ? Years of education: Not on file   ? Highest education level: Some college, no degree   Occupational History   ? Not on file   Tobacco Use   ? Smoking status: Never Smoker   ? Smokeless tobacco: Never Used   Vaping Use   ? Vaping Use: Never used   Substance and Sexual Activity   ? Alcohol use: Not Currently   ? Drug use: Never   ? Sexual activity: Not on file   Other Topics Concern   ? Not on file   Social History Narrative   ? Not on file       Allergies   Allergen Reactions   ? Isometheptene CHEST TIGHTNESS, CHILLS and MUSCLE PAIN   ? Sulfa (Sulfonamide Antibiotics) UNKNOWN       There were no vitals filed for this visit.      REVIEW OF SYSTEMS: 10 point ROS obtained and negative except as above and below      PHYSICAL EXAM:  General: Alert, cooperative, no acute distress.  HEENT: Normocephalic, atraumatic.  Neck: Supple.  Lungs: Unlabored respirations, bilateral and equal chest excursion.  Heart: Regular rate.  Skin: Warm and dry to touch.  Abdomen: Nondistended.  MSK: No deformity.  Neurological: Alert and oriented x3.         IMPRESSION:    1. Cervical radiculopathy    2. Myalgia, other site         PLAN: Cervical Epidural Steroid Injection C7-T1 CESI

## 2020-05-05 NOTE — Progress Notes

## 2020-05-05 NOTE — Procedures
Attending Surgeon: Evelina Bucy, MD    Anesthesia: Local    Pre-Procedure Diagnosis:   1. Cervical radiculopathy    2. Myalgia, other site    3. DDD (degenerative disc disease), cervical        Post-Procedure Diagnosis:   1. Cervical radiculopathy    2. Myalgia, other site    3. DDD (degenerative disc disease), cervical        [facility] INJECT INTERLAM CRV/THRC  Procedure: epidural - interlaminar    Laterality: n/a   on 05/05/2020 12:30 PM  Location: cervical ESI with imaging -  C7-T1      Consent:   Consent obtained: written  Consent given by: patient  Risks discussed: allergic reaction, bleeding, bruising, infection, never damage, no change or worsening in pain, pneumo thorax, reaction to medication, seizure, swelling and weakness  Alternatives discussed: alternative treatment, delayed treatment and no treatment  Discussed with patient the purpose of the treatment/procedure, other ways of treating my condition, including no treatment/ procedure and the risks and benefits of the alternatives. Patient has decided to proceed with treatment/procedure.        Universal Protocol:  Relevant documents: relevant documents present and verified  Site marked: the operative site was marked  Patient identity confirmed: Patient identify confirmed verbally with patient.        Time out: Immediately prior to procedure a time out was called to verify the correct patient, procedure, equipment, support staff and site/side marked as required      Procedures Details:   Indications: pain   Prep: chlorhexidine  Patient position: prone  Estimated Blood Loss: minimal  Specimens: none  Number of Joints: 1  Approach: midline  Guidance: fluoroscopy  Contrast: Procedure confirmed with contrast under live fluoroscopy.  Needle and Epidural Catheter: tuohy  Needle size: 18 G  Injection procedure: Incremental injection and Negative aspiration for blood  Amount Injected:   C7-T1: 4mL  Patient tolerance: Patient tolerated the procedure well with no immediate complications. Pressure was applied, and hemostasis was accomplished.  Outcome: Pain unchanged  Comments:   CERVICAL INTERLAMINAR EPIDURAL STEROID INJECTION    PROCEDURE:  1) C7-T1 interlaminar epidural steroid injection  2) Fluoroscopic needle guidance    REASON FOR PROCEDURE: Cervical radiculopathy, DDD (cervical)    ATTENDING PHYSICIAN: Evelina Bucy, MD    MEDICATIONS INJECTED: 2 mL of triamciniolone (80 mg) and 2 mL of sterile, preservative-free normal saline    LOCAL ANESTHETIC INJECTED: 1 mL of 1% lidocaine    SEDATION MEDICATIONS: None    ESTIMATED BLOOD LOSS: None    SPECIMENS REMOVED: None    COMPLICATIONS: None    TECHNIQUE: Time-out was taken to identify the correct patient, procedure and side prior to starting the procedure. With the patient lying in a prone position with the neck in a slightly  flexed position, the area was prepped and draped in sterile fashion using DuraPrep and a fenestrated drape. The area was determined under fluoroscopic guidance. A 27-gauge, 1.25-inch  needle was used to anesthetize the needle entry site and subcutaneous tissues.     The 18-gauge, 3.5-inch Tuohy needle was advanced through the ligamentum flavum using loss of resistance technique. Once the tip of the needle was thought to be in the desired position, contrast was injected to confirm only epidural spread and no vascular runoff via A-P and lateral views. The injectate was then injected slowly with intermittent negative aspiration.    The procedure was completed without complications and was tolerated well.  The patient was monitored after the procedure. The patient (or responsible party) was given post-procedure and  discharge instructions to follow at home. The patient was discharged in stable condition.    Evelina Bucy, MD, MBA          Estimated blood loss: none or minimal  Specimens: none  Patient tolerated the procedure well with no immediate complications. Pressure was applied, and hemostasis was accomplished.

## 2020-05-05 NOTE — Patient Instructions
Procedure Completed Today: Cervical Epidural Steroid Injection    Important information following your procedure today: You may drive today    1. Pain relief may not be immediate. It is possible you may even experience an increase in pain during the first 24-48 hours followed by a gradual decrease of your pain.  2. Though the procedure is generally safe and complications are rare, we do ask that you be aware of any of the following:   ? Any swelling, persistent redness, new bleeding, or drainage from the site of the injection.  ? You should not experience a severe headache.  ? You should not run a fever over 101? F.  ? New onset of sharp, severe back & or neck pain.  ? New onset of upper or lower extremity numbness or weakness.  ? New difficulty controlling bowel or bladder function after the injection.  ? New shortness of breath.    If any of these occur, please call to report this occurrence to a nurse at [phone]. If you are calling after 4:00 p.m., on weekends or holidays please call [phone] and ask to have the resident physician on call for the physician paged or go to your local emergency room.  3. You may experience soreness at the injection site. Ice can be applied at 20 minute intervals. Avoid application of direct heat, hot showers or hot tubs today.  4. Avoid strenuous activity today. You may resume your regular activities and exercise tomorrow.  5. Patients with diabetes may see an elevation in blood sugars for 7-10 days after the injection. It is important to pay close attention to your diet, check your blood sugars daily and report extreme elevations to the physician that treats your diabetes.  6. Patients taking a daily blood thinner can resume their regular dose this evening.  7. It is important that you take all medications ordered by your pain physician. Taking medication as ordered is an important part of your pain care plan. If you cannot continue the medication plan, please notify the physician.     Possible side effects to steroids that may occur:  ? Flushing or redness of the face  ? Irritability  ? Fluid retention  ? Change in women?s menses    The following medications were used: Triamcinolone   and Contrast Dye

## 2020-05-09 ENCOUNTER — Encounter: Admit: 2020-05-09 | Discharge: 2020-05-09 | Payer: MEDICARE

## 2020-05-16 ENCOUNTER — Encounter: Admit: 2020-05-16 | Discharge: 2020-05-16 | Payer: MEDICARE

## 2020-05-16 DIAGNOSIS — M5412 Radiculopathy, cervical region: Secondary | ICD-10-CM

## 2020-05-16 NOTE — Telephone Encounter
Pt calls requesting to schedule CESI out in three months. She has stated that if she gets a shot every 3 months it has been controlling her pain well.

## 2020-06-02 ENCOUNTER — Encounter: Admit: 2020-06-02 | Discharge: 2020-06-02 | Payer: MEDICARE

## 2020-06-02 DIAGNOSIS — M7918 Myalgia, other site: Secondary | ICD-10-CM

## 2020-06-16 ENCOUNTER — Encounter: Admit: 2020-06-16 | Discharge: 2020-06-16 | Payer: MEDICARE

## 2020-06-16 ENCOUNTER — Ambulatory Visit: Admit: 2020-06-16 | Discharge: 2020-06-16 | Payer: MEDICARE

## 2020-06-16 DIAGNOSIS — M7918 Myalgia, other site: Secondary | ICD-10-CM

## 2020-06-16 DIAGNOSIS — M47819 Spondylosis without myelopathy or radiculopathy, site unspecified: Secondary | ICD-10-CM

## 2020-06-16 DIAGNOSIS — G43909 Migraine, unspecified, not intractable, without status migrainosus: Secondary | ICD-10-CM

## 2020-06-16 DIAGNOSIS — R519 Generalized headaches: Secondary | ICD-10-CM

## 2020-06-16 DIAGNOSIS — M5412 Radiculopathy, cervical region: Secondary | ICD-10-CM

## 2020-06-16 DIAGNOSIS — M5030 Other cervical disc degeneration, unspecified cervical region: Secondary | ICD-10-CM

## 2020-06-16 MED ORDER — LIDOCAINE (PF) 20 MG/ML (2 %) IJ SOLN
5 mL | Freq: Once | INTRAMUSCULAR | 0 refills | Status: CP | PRN
Start: 2020-06-16 — End: ?
  Administered 2020-06-16: 14:00:00 5 mL via INTRAMUSCULAR

## 2020-06-16 MED ORDER — BUPIVACAINE (PF) 0.25 % (2.5 MG/ML) IJ SOLN
5 mL | Freq: Once | INTRAMUSCULAR | 0 refills | Status: CP | PRN
Start: 2020-06-16 — End: ?
  Administered 2020-06-16: 14:00:00 5 mL via INTRAMUSCULAR

## 2020-06-16 NOTE — Progress Notes
[facility]      Subjective     Chief Complaint: neck/head pain    Interval HPI: Rita Gibson is a 70 y.o. female who  has a past medical history of Generalized headaches and Migraines. who now presents for evaluation.    Patient presents to clinic for evaluation of low neck and TPI today.  ?  Patient reports about 50% relief after CESI performed on 05/05/2020. Patient states state that her pain is less severe overall since the procedure and reports the relief lasts for 3 months. Patient states with the procedure she has more good days, then bad days.    Patient reports 50% relief after cervical TPI performed on 03/17/2020. Patient states her trigger points are improved with the procedure and the relief last for about 3 months, then notes gradual return.     Patient reports her pain is relatively unchanged since last office visit.   ?  The pain is in the low neck region, will radiate to the base of the skull to the to the top to the head to the middle forehead and back of the eyes bilaterally, R>L  Will also have pain radiate to the shoulders bilaterally, R>L  Denies BUE pain  Pain is constant  Numbness/tingling: none  The pain ranges 2-7/10  The pain is described as dull, sore, stabbing, sharp   The pain is exacerbated by household chores, lifting arms above shoulder level  The pain is partially alleviated by rest, heating pad, ice, prescribed medications (gabapentin/Toradol)  +muscle stiffness/tightness  Denies strength loss in BUE/BLE  ?  Patient taking Gabapentin 600mg  TID and Baclofen as prescribed by PCP, reports mild relief of pain with medication use, denies any SEs from medication use. Patient states she had to reduce dose after a hospitalization for memory issues, since then she has been doing well.   ?  Hx of spine surgery: none  ?  Patient denies fevers, chills, infection, bleeding issues, anticoagulants, new muscle weakness, numbness, tingling, bowel/bladder incontinence, or saddle anesthesia.       ROS:   Review of Systems   Musculoskeletal: Positive for back pain, myalgias and neck pain.   Neurological: Positive for headaches.        PRIOR MEDICATIONS:   Effective  Gabapentin  Baclofen      Ineffective  NSAID    Unable to tolerate  Nortriptyline    Never  Lyrica  Cymbalta  Tizanidine      PRIOR INTERVENTIONS:  No spine surgery   Effective  CESI  TPI  Supraorbital nerve blocks    Ineffective  Physician-directed PT  Exercise        Berna Spare denies any recent fevers, chills, infection, antibiotics, bowel or bladder incontinence, saddle anesthesia, bleeding issues, or recent anticoagulant.       Past Medical History:  Medical History:   Diagnosis Date   ? Generalized headaches    ? Migraines        Family History:  Family History   Problem Relation Age of Onset   ? Arthritis Mother    ? Cataract Mother    ? Glaucoma Mother    ? Cancer Father    ? Amblyopia Neg Hx    ? Blindness Neg Hx    ? Strabismus Neg Hx    ? Retinal Detachment Neg Hx    ? Macular Degen Neg Hx        Social History:  Lives in  [place], [place].   Social History     Socioeconomic History   ? Marital status: Married     Spouse name: Not on file   ? Number of children: Not on file   ? Years of education: Not on file   ? Highest education level: Some college, no degree   Occupational History   ? Not on file   Tobacco Use   ? Smoking status: Never Smoker   ? Smokeless tobacco: Never Used   Vaping Use   ? Vaping Use: Never used   Substance and Sexual Activity   ? Alcohol use: Not Currently   ? Drug use: Never   ? Sexual activity: Not on file   Other Topics Concern   ? Not on file   Social History Narrative   ? Not on file       Allergies:  Allergies   Allergen Reactions   ? Isometheptene CHEST TIGHTNESS, CHILLS and MUSCLE PAIN   ? Sulfa (Sulfonamide Antibiotics) UNKNOWN       Medications:  Current Outpatient Medications on File Prior to Visit   Medication Sig Dispense Refill   ? alendronate (FOSAMAX) 70 mg tablet      ? baclofen (LIORESAL) 10 mg tablet Take 10 mg by mouth twice daily.     ? buPROPion SR(+) (WELLBUTRIN-SR) 150 mg tablet Take 150 mg by mouth twice daily.     ? calcium carbonate (CALCIUM 500 PO) Take  by mouth.     ? cholecalciferol (VITAMIN D-3) 1,000 units tablet Take 1,000 Units by mouth daily.     ? clonazePAM (KLONOPIN) 1 mg tablet Take one-half tablet by mouth at bedtime as needed.     ? ergocalciferol (vitamin D2) (DRISDOL) 1,250 mcg (50,000 unit) capsule Take one capsule by mouth every 7 days. 12 capsule 0   ? gabapentin (NEURONTIN) 600 mg tablet Take one tablet by mouth every 8 hours. 540 tablet 2   ? KETOROLAC TROMETHAMINE (KETOROLAC IJ) Inject  to area(s) as directed.     ? latanoprost (XALATAN) 0.005 % ophthalmic solution Apply one drop to both eyes at bedtime daily. 7.5 mL 3   ? omeprazole DR(+) (PRILOSEC) 40 mg capsule Take 40 mg by mouth daily before breakfast.     ? rosuvastatin (CRESTOR) 5 mg tablet Take 5 mg by mouth at bedtime daily.       No current facility-administered medications on file prior to visit.       Physical examination:   BP (!) 145/83 (BP Source: Arm, Left Upper, Patient Position: Sitting)  - Pulse 71  - Ht 162.6 cm (5' 4)  - Wt 46.7 kg (103 lb)  - SpO2 100%  - BMI 17.68 kg/m?   Pain Score: Two    Female Opioid Risk Score: 0 (08/09/2018  8:42 AM)  Opioid Risk Category: Low Risk (08/09/2018  8:42 AM)    Exam performed by instructing patient to perform various maneuvers and provide feedback while I personally visualized the patient exam.     General: The patient is a well-developed, well nourished 70 y.o. female in no acute distress.   HEENT: Head is normocephalic and atraumatic.  Pulmonary: The patient has unlabored respirations and bilateral symmetric chest excursion.   Abdomen: Soft, nontender, and nondistended. There is no rebound or guarding per patient assessment.  Extremities: No clubbing, cyanosis, or edema.    Neurologic:   The patient is alert and oriented times 3.    There is no sensory deficit  to light touch or pinprick in the affected areas. There is no allodynia noted.    +mild-moderate pain with palpation at the base of the occiput bilaterally, R>L, no radiating pain noted    Moderate TTP to upper trapezius and levator scapulae with increased muscle tone bilaterally with trigger points and referred pain, R>L.    Musculoskeletal:   Gait is normal.    C-Spine    There is mild-moderate paraspinal tenderness. Paraspinal muscle tone is increased with trigger points noted.   ROM with flexion, extension, rotation, and lateral bending is intact but stiff mostly noted with extension.   Strength is equal and adequate bilaterally in the flexors and extensors of the bilateral upper extremities.    Hoffmann negative bilaterally.       MRI C-Spine Results:  11/2015  C2-3 mild disc desiccation  C3-4 mod loss of disc height  C4-5 mild disc dessication. Left paracentral disc protrusion. mild R FA.   C5-6 +FA. Broad DOC. Left foraminal and paracentral disc protrusion. Miild-mod L NFS. Mild R NFS. Some effacement of thecal sac.  C6-7 DDD.   Spondylosis most pronounced at C5-6.       Last Cr and LFT's:  Creatinine   Date Value Ref Range Status   03/02/2020 0.87 0.4 - 1.00 MG/DL Final     AST (SGOT)   Date Value Ref Range Status   03/01/2020 24 7 - 40 U/L Final     ALT (SGPT)   Date Value Ref Range Status   03/01/2020 19 7 - 56 U/L Final     Alk Phosphatase   Date Value Ref Range Status   03/01/2020 63 25 - 110 U/L Final     Total Bilirubin   Date Value Ref Range Status   03/01/2020 0.7 0.3 - 1.2 MG/DL Final          Assessment:    Wing Peraino is a 70 y.o. female who  has a past medical history of Generalized headaches and Migraines. who presents for evaluation of neck pain.     The pain complaints are most likely due to:    1. Myalgia, other site  [place] [facility] SPINE TRIGGER POINT INJECTION   2. Cervical radiculopathy     3. DDD (degenerative disc disease), cervical     4. Facet arthropathy         Patient has had an adequate trial of > 3 months of rest, exercise, NSAID's, multimodal treatment, and the passage of time without improvement of symptoms. The pain has significant impact on the daily quality of life.     Plan:    1. Discussed plan of care options with patient. Will perform TPI today.  2. This patient had at least 50% pain relief for at least 3 months with the last epidural injection. Will continue with repeat C7-T1 CESI in 2-3 months as scheduled.   3. Continue Gabapentin and Baclofen as prescribed by PCP.  3. Discussed and will consider bilateral C6-T1 MBBs/RFA therapies in future, patient will discuss with spouse.   4. May still consider supraorbital nerve blocks in future.  5. Follow-up in 3 months.    Risks/benefits of all pharmacologic and interventional treatments discussed and questions answered.

## 2020-06-16 NOTE — Procedures
[place] [facility] SPINE TRIGGER POINT INJECTION  Locations: L upper trapezius, R upper trapezius, L cervical, R cervical, L thoracic, R thoracic, L levator scapulae and R levator scapulae  Consent:   Consent obtained: written and verbal  Consent given by: patient  Alternatives discussed: alternative treatment  Discussed with patient the purpose of the treatment/procedure, other ways of treating my condition, including no treatment/ procedure and the risks and benefits of the alternatives. Patient has decided to proceed with treatment/procedure.        Universal Protocol:  Relevant documents: relevant documents present and verified  Test results: test results available and properly labeled  Imaging studies: imaging studies not available  Required items: required blood products, implants, devices, and special equipment not available  Site marked: the operative site was not marked  Patient identity confirmed: Patient identify confirmed verbally with patient.        Time out: Immediately prior to procedure a "time out" was called to verify the correct patient, procedure, equipment, support staff and site/side marked as required      Procedures Details:   Indications: cervicalgia and myalgiaPrep: alcohol  Needle size: 27 G  Number of muscles: 3 or more  Approach: posterior  Medications administered: 5 mL bupivacaine PF 0.25 %; 5 mL lidocaine PF 20 mg/mL (2 %)  Patient tolerance: Patient tolerated the procedure well with no immediate complications. Pressure was applied, and hemostasis was accomplished.

## 2020-07-28 ENCOUNTER — Encounter: Admit: 2020-07-28 | Discharge: 2020-07-28 | Payer: MEDICARE

## 2020-08-14 ENCOUNTER — Encounter: Admit: 2020-08-14 | Discharge: 2020-08-14 | Payer: MEDICARE

## 2020-08-14 DIAGNOSIS — M5412 Radiculopathy, cervical region: Secondary | ICD-10-CM

## 2020-08-18 ENCOUNTER — Encounter: Admit: 2020-08-18 | Discharge: 2020-08-18 | Payer: MEDICARE

## 2020-08-18 ENCOUNTER — Ambulatory Visit: Admit: 2020-08-18 | Discharge: 2020-08-18 | Payer: MEDICARE

## 2020-08-18 DIAGNOSIS — M5030 Other cervical disc degeneration, unspecified cervical region: Secondary | ICD-10-CM

## 2020-08-18 DIAGNOSIS — M5412 Radiculopathy, cervical region: Secondary | ICD-10-CM

## 2020-08-18 DIAGNOSIS — R519 Generalized headaches: Secondary | ICD-10-CM

## 2020-08-18 DIAGNOSIS — G43909 Migraine, unspecified, not intractable, without status migrainosus: Secondary | ICD-10-CM

## 2020-08-18 MED ORDER — IOHEXOL 240 MG IODINE/ML IV SOLN
2.5 mL | Freq: Once | EPIDURAL | 0 refills | Status: CP
Start: 2020-08-18 — End: ?
  Administered 2020-08-18: 18:00:00 1 mL via EPIDURAL

## 2020-08-18 MED ORDER — TRIAMCINOLONE ACETONIDE 40 MG/ML IJ SUSP
80 mg | Freq: Once | EPIDURAL | 0 refills | Status: CP
Start: 2020-08-18 — End: ?
  Administered 2020-08-18: 18:00:00 80 mg via EPIDURAL

## 2020-08-18 NOTE — Progress Notes
[facility]  INTERVENTIONAL PAIN PROCEDURE HISTORY AND PHYSICAL    Chief Complaint: Pain    HISTORY OF PRESENT ILLNESS:    Patient returns today for interventional treatment of radicular pain. Patient denies any recent fevers, chills, infection, antibiotics, coagulopathy or contra-indicated anticoagulants. Risks of the procedure were discussed including but not limited to bleeding, infection, damage to surrounding structures and reaction to medications. Patient reports understanding and has elected to proceed with the procedure.      This patient had at least 50% pain relief for at least 3 months with the last epidural injection. The pain score was 9/10 prior to the injection and 3/10 following the injection.  This patient's clinical history, exam, AND imaging support radiculopathy AND there is a significant impact on quality of life and function AND their pain score has been documented in this note AND the pain has been present for at least 4 weeks AND they have failed to improve with noninvasive conservative care.       Medical History:   Diagnosis Date   ? Generalized headaches    ? Migraines        Surgical History:   Procedure Laterality Date   ? ANKLE SURGERY     ? HEART CATHETERIZATION     ? TUBAL LIGATION         family history includes Arthritis in her mother; Cancer in her father; Cataract in her mother; Glaucoma in her mother.    Social History     Socioeconomic History   ? Marital status: Married   ? Highest education level: Some college, no degree   Tobacco Use   ? Smoking status: Never Smoker   ? Smokeless tobacco: Never Used   Vaping Use   ? Vaping Use: Never used   Substance and Sexual Activity   ? Alcohol use: Not Currently   ? Drug use: Never       Allergies   Allergen Reactions   ? Isometheptene CHEST TIGHTNESS, CHILLS and MUSCLE PAIN   ? Sulfa (Sulfonamide Antibiotics) UNKNOWN       There were no vitals filed for this visit.       REVIEW OF SYSTEMS: 10 point ROS obtained and negative except as above.      PHYSICAL EXAM:  General: Alert, cooperative, no acute distress.  HEENT: Normocephalic, atraumatic.  Neck: Supple.  Lungs: Unlabored respirations, bilateral and equal chest excursion.  Heart: Regular rate.  Skin: Warm and dry to touch.  Abdomen: Nondistended.  MSK: No deformity  Neurological: Alert and oriented x3.        IMPRESSION:    1. Cervical radiculopathy    2. DDD (degenerative disc disease), cervical         PLAN: Cervical Epidural Steroid Injection, C7-T1

## 2020-08-18 NOTE — Progress Notes

## 2020-08-18 NOTE — Procedures
Attending Surgeon: Evelina Bucy, MD    Anesthesia: Local    Pre-Procedure Diagnosis:   1. Cervical radiculopathy    2. DDD (degenerative disc disease), cervical        Post-Procedure Diagnosis:   1. Cervical radiculopathy    2. DDD (degenerative disc disease), cervical        Pain Score: Three    [facility] SPINE INJECT INTERLAM CRV/THRC  Procedure: epidural - interlaminar    Laterality: n/a   on 08/18/2020 12:30 PM  Location: cervical ESI with imaging -  C7-T1      Consent:   Consent obtained: written  Consent given by: patient  Risks discussed: allergic reaction, bleeding, bruising, infection, never damage, no change or worsening in pain, pneumo thorax, reaction to medication, seizure, swelling and weakness  Alternatives discussed: alternative treatment, delayed treatment and no treatment  Discussed with patient the purpose of the treatment/procedure, other ways of treating my condition, including no treatment/ procedure and the risks and benefits of the alternatives. Patient has decided to proceed with treatment/procedure.        Universal Protocol:  Relevant documents: relevant documents present and verified  Site marked: the operative site was marked  Patient identity confirmed: Patient identify confirmed verbally with patient.        Time out: Immediately prior to procedure a time out was called to verify the correct patient, procedure, equipment, support staff and site/side marked as required      Procedures Details:   Indications: pain   Prep: chlorhexidine  Patient position: prone  Estimated Blood Loss: minimal  Specimens: none  Number of Joints: 1  Approach: midline  Guidance: fluoroscopy  Contrast: Procedure confirmed with contrast under live fluoroscopy.  Needle and Epidural Catheter: tuohy  Needle size: 18 G  Injection procedure: Incremental injection and Negative aspiration for blood  Amount Injected:   C7-T1: 4mL  Patient tolerance: Patient tolerated the procedure well with no immediate complications. Pressure was applied, and hemostasis was accomplished.  Outcome: Pain unchanged  Comments:   CERVICAL INTERLAMINAR EPIDURAL STEROID INJECTION    PROCEDURE:  1) C7-T1 interlaminar epidural steroid injection  2) Fluoroscopic needle guidance    REASON FOR PROCEDURE: Cervical radiculopathy, DDD (cervical)    ATTENDING PHYSICIAN: Evelina Bucy, MD    MEDICATIONS INJECTED: 2 mL of triamciniolone (80 mg) and 2 mL of sterile, preservative-free normal saline    LOCAL ANESTHETIC INJECTED: 1 mL of 1% lidocaine    SEDATION MEDICATIONS: None    ESTIMATED BLOOD LOSS: None    SPECIMENS REMOVED: None    COMPLICATIONS: None    TECHNIQUE: Time-out was taken to identify the correct patient, procedure and side prior to starting the procedure. With the patient lying in a prone position with the neck in a slightly  flexed position, the area was prepped and draped in sterile fashion using DuraPrep and a fenestrated drape. The area was determined under fluoroscopic guidance. A 27-gauge, 1.25-inch  needle was used to anesthetize the needle entry site and subcutaneous tissues.     The 18-gauge, 3.5-inch Tuohy needle was advanced through the ligamentum flavum using loss of resistance technique. Once the tip of the needle was thought to be in the desired position, contrast was injected to confirm only epidural spread and no vascular runoff via A-P and lateral  views. The injectate was then injected slowly with intermittent negative aspiration.    The procedure was completed without complications and was tolerated well. The patient was monitored after the procedure.  The patient (or responsible party) was given post-procedure and  discharge instructions to follow at home. The patient was discharged in stable condition.    Evelina Bucy, MD, MBA          Administrations This Visit     iohexoL (OMNIPAQUE-240) 240 mg/mL injection 2.5 mL     Admin Date  08/18/2020 Action  Given Dose  1 mL Route  Epidural Administered By  Evelina Bucy, MD triamcinolone acetonide (KENALOG-40) injection 80 mg     Admin Date  08/18/2020 Action  Given Dose  80 mg Route  Epidural Administered By  Evelina Bucy, MD              Estimated blood loss: none or minimal  Specimens: none  Patient tolerated the procedure well with no immediate complications. Pressure was applied, and hemostasis was accomplished.

## 2020-09-15 ENCOUNTER — Encounter: Admit: 2020-09-15 | Discharge: 2020-09-15 | Payer: MEDICARE

## 2020-09-15 ENCOUNTER — Ambulatory Visit: Admit: 2020-09-15 | Discharge: 2020-09-15 | Payer: MEDICARE

## 2020-09-15 DIAGNOSIS — M5030 Other cervical disc degeneration, unspecified cervical region: Secondary | ICD-10-CM

## 2020-09-15 DIAGNOSIS — M62838 Other muscle spasm: Secondary | ICD-10-CM

## 2020-09-15 DIAGNOSIS — M7918 Myalgia, other site: Secondary | ICD-10-CM

## 2020-09-15 DIAGNOSIS — M47819 Spondylosis without myelopathy or radiculopathy, site unspecified: Secondary | ICD-10-CM

## 2020-09-15 DIAGNOSIS — M47812 Spondylosis without myelopathy or radiculopathy, cervical region: Secondary | ICD-10-CM

## 2020-09-15 DIAGNOSIS — G43909 Migraine, unspecified, not intractable, without status migrainosus: Secondary | ICD-10-CM

## 2020-09-15 DIAGNOSIS — R519 Generalized headaches: Secondary | ICD-10-CM

## 2020-09-15 MED ORDER — LIDOCAINE (PF) 20 MG/ML (2 %) IJ SOLN
5 mL | Freq: Once | INTRAMUSCULAR | 0 refills | Status: CP | PRN
Start: 2020-09-15 — End: ?
  Administered 2020-09-15: 13:00:00 5 mL via INTRAMUSCULAR

## 2020-09-15 MED ORDER — PREGABALIN 75 MG PO CAP
75 mg | ORAL_CAPSULE | Freq: Three times a day (TID) | ORAL | 2 refills | Status: AC
Start: 2020-09-15 — End: ?

## 2020-09-15 MED ORDER — BUPIVACAINE (PF) 0.25 % (2.5 MG/ML) IJ SOLN
5 mL | Freq: Once | INTRAMUSCULAR | 0 refills | Status: CP | PRN
Start: 2020-09-15 — End: ?
  Administered 2020-09-15: 13:00:00 5 mL via INTRAMUSCULAR

## 2020-09-15 NOTE — Progress Notes
[facility] - Interventional Pain      Subjective     Chief Complaint: Pain    Interval HPI: Rita Gibson is a 70 y.o. female who  has a past medical history of Generalized headaches and Migraines. who now presents for evaluation.    She is evaluated for a complaint unrelated to her procedure today.     She reports that she had complete resolution of radicular pain since late June 2022.     She only complains of facetogenic pain at this point.     It has been present for about 5 years. It has been much worse in the last 3 months. It is flared up.     It is axial pain in the neck.   It extends into the shoulders and upper back.   Worse on the right side.     No radicular UE pain.    There is associated muscle stiffness/tightness.     It markedly affects her ADL's and QOL.     Oswestry Total Score:: 20    Hx of spine surgery: none  ?  Patient denies fevers, chills, infection, bleeding issues, anticoagulants, new muscle weakness, numbness, tingling, bowel/bladder incontinence, or saddle anesthesia.          PRIOR MEDICATIONS:   Effective  Gabapentin  Baclofen      Ineffective  NSAID    Unable to tolerate  Nortriptyline    Never  Lyrica  Cymbalta  Tizanidine      PRIOR INTERVENTIONS:  No spine surgery   Effective  CESI  TPI  Supraorbital nerve blocks    Ineffective  Physician-directed PT  Exercise        Rita Gibson denies any recent fevers, chills, infection, antibiotics, bowel or bladder incontinence, saddle anesthesia, bleeding issues, or recent anticoagulant.       Past Medical History:  Medical History:   Diagnosis Date   ? Generalized headaches    ? Migraines        Family History:  Family History   Problem Relation Age of Onset   ? Arthritis Mother    ? Cataract Mother    ? Glaucoma Mother    ? Cancer Father    ? Amblyopia Neg Hx    ? Blindness Neg Hx    ? Strabismus Neg Hx    ? Retinal Detachment Neg Hx    ? Macular Degen Neg Hx        Social History:  [place]   Social History     Socioeconomic History   ? Marital status: Married   ? Highest education level: Some college, no degree   Tobacco Use   ? Smoking status: Never Smoker   ? Smokeless tobacco: Never Used   Vaping Use   ? Vaping Use: Never used   Substance and Sexual Activity   ? Alcohol use: Not Currently   ? Drug use: Never       Allergies:  Allergies   Allergen Reactions   ? Isometheptene CHEST TIGHTNESS, CHILLS and MUSCLE PAIN   ? Sulfa (Sulfonamide Antibiotics) UNKNOWN       Medications:  Current Outpatient Medications on File Prior to Visit   Medication Sig Dispense Refill   ? alendronate (FOSAMAX) 70 mg tablet      ? baclofen (LIORESAL) 10 mg tablet Take 10 mg by mouth twice daily.     ? buPROPion SR(+) (WELLBUTRIN-SR) 150 mg tablet Take 150 mg  by mouth twice daily.     ? calcium carbonate (CALCIUM 500 PO) Take  by mouth.     ? cholecalciferol (VITAMIN D-3) 1,000 units tablet Take 1,000 Units by mouth daily.     ? clonazePAM (KLONOPIN) 1 mg tablet Take one-half tablet by mouth at bedtime as needed.     ? ergocalciferol (vitamin D2) (DRISDOL) 1,250 mcg (50,000 unit) capsule Take one capsule by mouth every 7 days. 12 capsule 0   ? gabapentin (NEURONTIN) 600 mg tablet Take one tablet by mouth every 8 hours. 540 tablet 2   ? KETOROLAC TROMETHAMINE (KETOROLAC IJ) Inject  to area(s) as directed.     ? latanoprost (XALATAN) 0.005 % ophthalmic solution Apply one drop to both eyes at bedtime daily. 7.5 mL 3   ? omeprazole DR(+) (PRILOSEC) 40 mg capsule Take 40 mg by mouth daily before breakfast.     ? rosuvastatin (CRESTOR) 5 mg tablet Take 5 mg by mouth at bedtime daily.       No current facility-administered medications on file prior to visit.       Physical examination:   BP 128/80  - Pulse 69  - Ht 162.6 cm (5' 4)  - Wt 46.7 kg (103 lb)  - SpO2 100%  - BMI 17.68 kg/m?   Pain Score: Three  Oswestry Total Score:: 20    Female Opioid Risk Score: 0 (08/09/2018  8:42 AM)  Opioid Risk Category: Low Risk (08/09/2018  8:42 AM)    General: The patient is a well-developed, well nourished 70 y.o. female in no acute distress.   HEENT: Head is normocephalic and atraumatic.  Pulmonary: The patient has unlabored respirations and bilateral symmetric chest excursion.   Abdomen: Soft, nontender, and nondistended.   Extremities: No clubbing, cyanosis, or edema.    Neurologic:   The patient is alert and oriented times 3.      Moderate TTP to upper trapezius and levator scapulae and cervical paraspinals with increased muscle tone bilaterally with trigger points and referred pain, R>L.    Musculoskeletal:     C-Spine    There is moderate paraspinal tenderness. Paraspinal muscle tone is increased with trigger points noted.   ROM with flexion, extension, rotation, and lateral bending is intact but stiff and pain noted with extension and twisting.   Strength is equal and adequate bilaterally in the flexors and extensors of the bilateral upper extremities.    Hoffmann negative bilaterally.       MRI C-Spine Results:  11/2015  C2-3 mild disc desiccation  C3-4 mod loss of disc height  C4-5 mild disc dessication. Left paracentral disc protrusion. mild R FA.   C5-6 +FA. Broad DOC. Left foraminal and paracentral disc protrusion. Miild-mod L NFS. Mild R NFS. Some effacement of thecal sac.  C6-7 DDD.   Spondylosis and multilevel FA. most pronounced at C5-6.       Last Cr and LFT's:  Creatinine   Date Value Ref Range Status   03/02/2020 0.87 0.4 - 1.00 MG/DL Final     AST (SGOT)   Date Value Ref Range Status   03/01/2020 24 7 - 40 U/L Final     ALT (SGPT)   Date Value Ref Range Status   03/01/2020 19 7 - 56 U/L Final     Alk Phosphatase   Date Value Ref Range Status   03/01/2020 63 25 - 110 U/L Final     Total Bilirubin   Date Value Ref Range Status  03/01/2020 0.7 0.3 - 1.2 MG/DL Final          Assessment:    Rita Gibson is a 70 y.o. female who  has a past medical history of Generalized headaches and Migraines. who presents for evaluation of neck pain. The pain complaints are most likely due to:    1. Myalgia, other site  [place] [facility] SP[facility] TRIGGER POINT INJECTION    Trigger Point   2. Spondylosis of cervical region without myelopathy or radiculopathy     3. DDD (degenerative disc disease), cervical     4. Facet arthropathy     5. Muscle spasm         Patient has had an adequate trial of > 3 months of rest, exercise, NSAID's, multimodal treatment, and the passage of time without improvement of symptoms. The pain has significant impact on the daily quality of life.     Plan:    1. Plan for Bilateral C6-T1 MBB x2 in PROCEDURES.   Facetogenic pain >3 mos  Failed to improve with conservative treatment including pharmacologic, passage of time, and exercise.   Imaging consistent with pain.   Other diagnoses ruled out.   Baseline Oswestry Oswestry Total Score:: 20    3. Continue Baclofen as prescribed by PCP.  4. She did not tolerate higher dose of gabapentin. However, she is not sure how much relief she is having with this lower dose. Will transition to Lyrica 75mg  tid titration instead.   5. May still consider supraorbital nerve blocks in future.  6. Follow up as needed.     Risks/benefits of all pharmacologic and interventional treatments discussed and questions answered.

## 2020-09-15 NOTE — Patient Instructions
General Instructions:   How to reach me: Please send a MyChart message to the [facility] or leave a voicemail for my nurse Rebecca at [phone].   Scheduling: Our scheduling phone number is [phone].   How to get a medication refill: Five business days before refill needed, please use the MyChart Refill request or contact your pharmacy directly to request medication refills.    How to receive your test results: If you have signed up for MyChart, you will receive your test results and messages from me this way. Otherwise, you will get a phone call or letter. If you are expecting results and have not heard from my office within 2 weeks of your testing, please send a MyChart message or call my office.   Support for many chronic illnesses is available through [facility]: [email or web address] or [phone].   For questions on nights, weekends or holidays, call the operator at [phone], and ask for the doctor on call for Anesthesia Pain Management.  

## 2020-09-15 NOTE — Procedures
Attending Surgeon: Evelina Bucy, MD    Anesthesia: Local    Pre-Procedure Diagnosis:   1. Myalgia, other site        Post-Procedure Diagnosis:   1. Myalgia, other site        Pain Score: Three    Trigger Point  Locations: L upper trapezius, R upper trapezius, L levator scapulae, R levator scapulae, R cervical, L cervical, L thoracic and R thoracic  Consent:   Consent obtained: written  Consent given by: patient  Alternatives discussed: alternative treatment, delayed treatment and no treatment  Discussed with patient the purpose of the treatment/procedure, other ways of treating my condition, including no treatment/ procedure and the risks and benefits of the alternatives. Patient has decided to proceed with treatment/procedure.        Universal Protocol:  Relevant documents: relevant documents present and verified  Site marked: the operative site was marked  Patient identity confirmed: Patient identify confirmed verbally with patient.        Time out: Immediately prior to procedure a time out was called to verify the correct patient, procedure, equipment, support staff and site/side marked as required      Procedures Details:   Indications: muscle spasm, myalgia and painPrep: alcohol  Needle size: 27 G  Number of muscles: 3 or more  Approach: posterior  Medications administered: 5 mL bupivacaine PF 0.25 %; 5 mL lidocaine PF 20 mg/mL (2 %)  Patient tolerance: Patient tolerated the procedure well with no immediate complications. Pressure was applied, and hemostasis was accomplished.  Comments:   PROCEDURE: Trigger Point Injections    Pre-Procedure Diagnoses:  1. Myalgia, other  2. Myofascial pain  3. Muscle spasm    Post-Procedure Diagnoses:  Same    Physician: Evelina Bucy, MD    ANESTHESIA: 50:50 solution of Bupivacaine 0.25% and lidocaine 2%    COMPLICATIONS: None.    Location(s) of pain: As noted above  Presence of point tenderness in a tight band of muscle in above area: Yes  Restricted range of motion: Yes  Conservative therapy attempted for at least 6 weeks: activity modification, pharmacotherapy  Other components of comprehensive management:  exercises, pharmacotherapy, activity modification    DESCRIPTION OF PROCEDURE: The procedure risks, hazards and alternatives were discussed with the patient and a proper consent was obtained. The area over each trigger point was prepped with alcohol utilizing sterile technique. After isolating it between two palpating fingertips a 27-gauge 1.5 needle was placed in the center of the myofascial spasms and a negative aspiration was performed. Then 0.5cc of the solution above was injected into each trigger point.     Trigger points in each of the muscle groups noted above were injected. A total of 7 ml of the local anesthetic solution was utilized.    The patient tolerated the procedure well without any apparent difficulties or complications.            Estimated blood loss: none or minimal  Specimens: none  Patient tolerated the procedure well with no immediate complications. Pressure was applied, and hemostasis was accomplished.

## 2020-09-23 ENCOUNTER — Encounter: Admit: 2020-09-23 | Discharge: 2020-09-23 | Payer: MEDICARE

## 2020-10-16 ENCOUNTER — Encounter: Admit: 2020-10-16 | Discharge: 2020-10-16 | Payer: MEDICARE

## 2020-10-16 MED ORDER — PREGABALIN 25 MG PO CAP
25 mg | ORAL_CAPSULE | Freq: Three times a day (TID) | ORAL | 3 refills | Status: AC
Start: 2020-10-16 — End: ?

## 2020-10-16 NOTE — Telephone Encounter
Pt reports that she has had difficulty thinking and forgetfulness since starting pregabalin. Discuss options, pt agreeable to take a low dose of pregabalin.     Route to Dr. Lourdes Sledge.

## 2020-10-31 ENCOUNTER — Encounter: Admit: 2020-10-31 | Discharge: 2020-10-31 | Payer: MEDICARE

## 2020-10-31 DIAGNOSIS — M47812 Spondylosis without myelopathy or radiculopathy, cervical region: Secondary | ICD-10-CM

## 2020-10-31 DIAGNOSIS — M5030 Other cervical disc degeneration, unspecified cervical region: Secondary | ICD-10-CM

## 2020-11-03 ENCOUNTER — Encounter: Admit: 2020-11-03 | Discharge: 2020-11-03 | Payer: MEDICARE

## 2020-11-03 ENCOUNTER — Ambulatory Visit: Admit: 2020-11-03 | Discharge: 2020-11-03 | Payer: MEDICARE

## 2020-11-03 DIAGNOSIS — M5030 Other cervical disc degeneration, unspecified cervical region: Secondary | ICD-10-CM

## 2020-11-03 DIAGNOSIS — M47812 Spondylosis without myelopathy or radiculopathy, cervical region: Secondary | ICD-10-CM

## 2020-11-03 MED ORDER — BUPIVACAINE (PF) 0.5 % (5 MG/ML) IJ SOLN
3 mL | Freq: Once | INTRAMUSCULAR | 0 refills | Status: CP
Start: 2020-11-03 — End: ?
  Administered 2020-11-03: 21:00:00 3 mL via INTRAMUSCULAR

## 2020-11-03 NOTE — Progress Notes

## 2020-11-03 NOTE — Progress Notes
[facility]  INTERVENTIONAL PAIN PROCEDURE HISTORY AND PHYSICAL    Chief Complaint: Pain    HISTORY OF PRESENT ILLNESS:    Patient returns today for interventional treatment of neck pain. Patient denies any recent fevers, chills, infection, antibiotics, coagulopathy or contraindicated anticoagulants. Risks of the procedure were discussed including but not limited to bleeding, infection, damage to surrounding structures and reaction to medications. Patient reports understanding and has elected to proceed with the procedure.          Medical History:   Diagnosis Date   ? Generalized headaches    ? Migraines        Surgical History:   Procedure Laterality Date   ? ANKLE SURGERY     ? HEART CATHETERIZATION     ? TUBAL LIGATION         family history includes Arthritis in her mother; Cancer in her father; Cataract in her mother; Glaucoma in her mother.    Social History     Socioeconomic History   ? Marital status: Married   ? Highest education level: Some college, no degree   Tobacco Use   ? Smoking status: Never Smoker   ? Smokeless tobacco: Never Used   Vaping Use   ? Vaping Use: Never used   Substance and Sexual Activity   ? Alcohol use: Not Currently   ? Drug use: Never       Allergies   Allergen Reactions   ? Isometheptene CHEST TIGHTNESS, CHILLS and MUSCLE PAIN   ? Sulfa (Sulfonamide Antibiotics) UNKNOWN       Vitals:    11/03/20 1518   BP: 137/71   BP Source: Arm, Right Upper   Pulse: 64   Temp: 37 ?C (98.6 ?F)   Resp: 16   SpO2: 100%   O2 Device: None (Room air)   TempSrc: Oral   PainSc: Two   Weight: 47.6 kg (105 lb)   Height: 162.6 cm (5' 4)     Pain Score: Two    REVIEW OF SYSTEMS: 10 point ROS obtained and negative except as above.      PHYSICAL EXAM:  General: Alert, cooperative, no acute distress.  HEENT: Normocephalic, atraumatic.  Neck: Supple.  Lungs: Unlabored respirations, bilateral and equal chest excursion.  Heart: Regular rate.  Skin: Warm and dry to touch.  Abdomen: Nondistended.  MSK: No deformity  Neurological: Alert and oriented x3.        IMPRESSION:    1. Spondylosis of cervical region without myelopathy or radiculopathy         PLAN: Other MBB #1, C6-T1 bilateral    Annye Rusk, MD  Interventional Pain Medicine Fellow, PGY-5  Available on [facility]

## 2020-11-03 NOTE — Procedures
Attending Surgeon: Evelina Bucy, MD    Anesthesia: Local    Pre-Procedure Diagnosis:   1. Spondylosis of cervical region without myelopathy or radiculopathy        Post-Procedure Diagnosis:   1. Spondylosis of cervical region without myelopathy or radiculopathy        Pain Score: Two    [place] [facility] SP[facility] INJECT MBB CERV/THOR  Procedure: medial branch block    Laterality: bilateral    Location: - C5, C6 and C7      Consent:   Consent obtained: written  Consent given by: patient  Risks discussed: allergic reaction, bleeding, bruising, infection, no change or worsening in pain, reaction to medication, seizure, weakness and swelling  Alternatives discussed: alternative treatment, delayed treatment, no treatment and referral  Discussed with patient the purpose of the treatment/procedure, other ways of treating my condition, including no treatment/ procedure and the risks and benefits of the alternatives. Patient has decided to proceed with treatment/procedure.        Universal Protocol:  Relevant documents: relevant documents present and verified  Test results: test results available and properly labeled  Imaging studies: imaging studies available  Required items: required blood products, implants, devices, and special equipment available  Site marked: the operative site was marked  Patient identity confirmed: Patient identify confirmed verbally with patient.        Time out: Immediately prior to procedure a time out was called to verify the correct patient, procedure, equipment, support staff and site/side marked as required      Procedures Details:   Indications: pain and diagnostic evaluation   Prep: chlorhexidine  Patient position: prone  Number of Joints: 2  Guidance: fluoroscopy  Needle size: 25 G  Patient tolerance: Patient tolerated the procedure well with no immediate complications. Pressure was applied, and hemostasis was accomplished.  Comments:   Cervical Medial Branch Blocks with Fluoroscopy (AP Approach)    PROCEDURE:  1) C5-C7 bilateral medial branch blocks  2) Fluoroscopic needle guidance    REASON FOR PROCEDURE: Cervical spondylosis without myelopathy or radiculopathy, DDD (cervical), facet arthropathy    PHYSICIAN: Evelina Bucy, MD, MBA    MEDICATIONS INJECTED: 0.4 mL of 0.5% bupivacaine at each level    LOCAL ANESTHETIC INJECTED: Lidocaine 1%, 0.70ml at each site    SEDATION MEDICATIONS: None    ESTIMATED BLOOD LOSS: None    COMPLICATIONS: None    TECHNIQUE: Time-out was taken to identify the correct patient, procedure and side prior to starting the procedure. Lying in a prone position, the patient was prepped and draped in the usual sterile fashion using DuraPrep and a fenestrated drape. The level was determined under AP fluoroscopy.     The 25 gauge 3.5 Quincke needle was introduced to the anatomic location of the medial branch at the waist of each vertebral level utilizing intermittent fluoroscopy. Medication was then injected slowly following negative aspiration.    The procedure was completed without complications and was tolerated well. The patient was monitored after the procedure. The patient (or responsible party) was given post-procedure and discharge instructions to follow at home. The patient was discharged in stable condition.            Administrations This Visit     bupivacaine PF (MARCAINE) 0.5 % injection 3 mL     Admin Date  11/03/2020 Action  Given Dose  3 mL Route  Injection Administered By  Evelina Bucy, MD  Estimated blood loss: none or minimal  Specimens: none  Patient tolerated the procedure well with no immediate complications. Pressure was applied, and hemostasis was accomplished.

## 2020-11-03 NOTE — Patient Instructions
Discharge Instructions for Medial Branch Block    Important information following your procedure today: You may drive today    This injection is for diagnostic purposes, it is a test. Only short term results are expected.    Though the procedure is generally safe and complications are rare, we Rita Gibson ask that you be aware of any of the following:   Any swelling, persistent redness, new bleeding, or drainage from the site of the injection.  You should not experience a severe headache.  You should not run a fever over 101? F.  New onset of sharp, severe back & or neck pain.  New onset of upper or lower extremity numbness or weakness.  New difficulty controlling bowel or bladder function after the injection.  New shortness of breath.    If any of these occur, please call to report this occurrence to Dr. Lourdes Sledge at [phone]. If you are calling after 4:00 p.m. or on weekends or holidays please call [phone] and ask to have the resident physician on call for the physician paged or go to your local emergency room.  You may experience soreness at the injection site. Ice can be applied at 20 minute intervals. Avoid application of direct heat, hot showers or hot tubs today.  Patients taking a daily blood thinner can resume their regular dose this evening.  It is important that you take all medications ordered by your pain physician. Taking medication as ordered is an important part of your pain care plan. If you cannot continue the medication plan, please notify the physician.   Remain active today. Rita Gibson the activities that would normally cause you pain.  It is important for you to keep track of the results of this test on paper.  Did you get relief?  Percentage of relief?  How long did it last?  Call back to report the results to a nurse on Tuesday  at Dr. Lourdes Sledge at [phone]. Use notes that you kept when giving your report. You may have to leave a message and a nurse will contact you to help you determine if you are a candidate for the Radiofrequency Ablation Procedure. After the injection, you should get at least 80% relief for up to 1 hour.      The following medications were used: Lidocaine  and Bupivicaine      PAIN DIARY  Please report percentage of relief for each hour listed following discharge.  TIME DAY OF PROCEDURE LOCATION OF PAIN   Pain Level  Prior to Procedure 11/03/2020  HEAD: 2/10   9:00 AM     10:00     11:00     12:00 (NOON)     1:00 PM     2:00     3:00     4:00     5:00     6:00     7:00     8:00     9:00     10:00     11:00 PM     12:00 AM (MIDNIGHT)              If you are unable to keep your upcoming appointment, please notify the [facility] scheduler at [phone] at least 24 hours in advance.

## 2020-11-12 ENCOUNTER — Encounter: Admit: 2020-11-12 | Discharge: 2020-11-12 | Payer: MEDICARE

## 2020-11-12 NOTE — Telephone Encounter
Pt reports she has been taking the lyrica 25 mg po bid. She is skipping the night time dose since it seems to keep her awake.

## 2020-11-14 ENCOUNTER — Encounter: Admit: 2020-11-14 | Discharge: 2020-11-14 | Payer: MEDICARE

## 2020-11-16 NOTE — Progress Notes
[facility]  INTERVENTIONAL PAIN PROCEDURE HISTORY AND PHYSICAL    Chief Complaint: Pain    HISTORY OF PRESENT ILLNESS:    Patient returns today for interventional treatment of axial neck pain. Patient denies any recent fevers, chills, infection, antibiotics, coagulopathy or contra-indicated anticoagulants. Risks of the procedure were discussed including but not limited to bleeding, infection, damage to surrounding structures and reaction to medications. Patient reports understanding and has elected to proceed with the procedure.      This patient had at least 100% pain relief for at least 3 hours with the last MBB. The pain score was 3/10 prior to the injection and 0/10 following the injection.    Medial Branch Block Checklist:    Moderate to severe chronic neck/low back pain, predominately axial, that causes functional deficit measured on pain or disability scale.  Yes  Pain present for minimum of 3 months with documented failure to respond to noninvasive conservative management such as passage of time, rest, medication management     Absence of untreated radiculopathy  Yes *[x]   No []   Abscense of untreated neurogenic claudication (except for radiculopathy caused by facet joint synovial cyst)                        Yes *[x]   No []               There is no non-facet pathology per clinical assessment or radiology studies that could explain the source of the patient 's pain, including but not limited to fracture, tumor, infection, or significant deformity.             Previous Medial Branch Block injection was greater than 2 weeks      Yes  Previous radiculopathy (if present) treated by ESI injection Yes *[x]   No []      Has this level been previously ablated greater than 2 years (if prior ablation was performed) Yes *[x]   No []        This level has never had an Anterior Lumbar Interbody Fusion      Yes *[x]   No []                       Medical History:   Diagnosis Date   ? Generalized headaches    ? Migraines Surgical History:   Procedure Laterality Date   ? ANKLE SURGERY     ? HEART CATHETERIZATION     ? TUBAL LIGATION         family history includes Arthritis in her mother; Cancer in her father; Cataract in her mother; Glaucoma in her mother.    Social History     Socioeconomic History   ? Marital status: Married   ? Highest education level: Some college, no degree   Tobacco Use   ? Smoking status: Never Smoker   ? Smokeless tobacco: Never Used   Vaping Use   ? Vaping Use: Never used   Substance and Sexual Activity   ? Alcohol use: Not Currently   ? Drug use: Never       Allergies   Allergen Reactions   ? Isometheptene CHEST TIGHTNESS, CHILLS and MUSCLE PAIN   ? Sulfa (Sulfonamide Antibiotics) UNKNOWN       There were no vitals filed for this visit.       REVIEW OF SYSTEMS: 10 point ROS obtained and negative except as above.      PHYSICAL EXAM:  General: Alert,  cooperative, no acute distress.  HEENT: Normocephalic, atraumatic.  Neck: Supple.  Lungs: Unlabored respirations, bilateral and equal chest excursion.  Heart: Regular rate.  Skin: Warm and dry to touch.  Abdomen: Nondistended.  MSK: No deformity  Neurological: Alert and oriented x3.        IMPRESSION:    1. Spondylosis of cervical region without myelopathy or radiculopathy    2. DDD (degenerative disc disease), cervical    3. Facet arthropathy         PLAN: Bilateral C6-T1 MBB #2    Facetogenic pain >3 mos  Failed to improve with conservative treatment including pharmacologic, passage of time, and exercise.   Imaging consistent with pain.   Other diagnoses ruled out.   Baseline Oswestry  20

## 2020-11-17 ENCOUNTER — Ambulatory Visit: Admit: 2020-11-17 | Discharge: 2020-11-17 | Payer: MEDICARE

## 2020-11-17 ENCOUNTER — Encounter: Admit: 2020-11-17 | Discharge: 2020-11-17 | Payer: MEDICARE

## 2020-11-17 DIAGNOSIS — M47812 Spondylosis without myelopathy or radiculopathy, cervical region: Secondary | ICD-10-CM

## 2020-11-17 DIAGNOSIS — M47819 Spondylosis without myelopathy or radiculopathy, site unspecified: Secondary | ICD-10-CM

## 2020-11-17 DIAGNOSIS — M5030 Other cervical disc degeneration, unspecified cervical region: Secondary | ICD-10-CM

## 2020-11-17 DIAGNOSIS — R519 Generalized headaches: Secondary | ICD-10-CM

## 2020-11-17 DIAGNOSIS — G43909 Migraine, unspecified, not intractable, without status migrainosus: Secondary | ICD-10-CM

## 2020-11-17 MED ORDER — BUPIVACAINE (PF) 0.5 % (5 MG/ML) IJ SOLN
3 mL | Freq: Once | INTRAMUSCULAR | 0 refills | Status: CP
Start: 2020-11-17 — End: ?
  Administered 2020-11-17: 18:00:00 3 mL via INTRAMUSCULAR

## 2020-11-17 NOTE — Progress Notes

## 2020-11-17 NOTE — Procedures
Attending Surgeon: Evelina Bucy, MD    Anesthesia: Local    Pre-Procedure Diagnosis:   1. Spondylosis of cervical region without myelopathy or radiculopathy    2. DDD (degenerative disc disease), cervical    3. Facet arthropathy        Post-Procedure Diagnosis:   1. Spondylosis of cervical region without myelopathy or radiculopathy    2. DDD (degenerative disc disease), cervical    3. Facet arthropathy        Pain Score: Zero    [place] [facility] SP[facility] INJECT MBB CERV/THOR  Procedure: medial branch block    Laterality: bilateral    Location: - C5, C6 and C7      Consent:   Consent obtained: written  Consent given by: patient  Risks discussed: allergic reaction, bleeding, bruising, infection, no change or worsening in pain, reaction to medication, seizure, weakness and swelling  Alternatives discussed: alternative treatment, delayed treatment, no treatment and referral  Discussed with patient the purpose of the treatment/procedure, other ways of treating my condition, including no treatment/ procedure and the risks and benefits of the alternatives. Patient has decided to proceed with treatment/procedure.        Universal Protocol:  Relevant documents: relevant documents present and verified  Test results: test results available and properly labeled  Imaging studies: imaging studies available  Required items: required blood products, implants, devices, and special equipment available  Site marked: the operative site was marked  Patient identity confirmed: Patient identify confirmed verbally with patient.        Time out: Immediately prior to procedure a time out was called to verify the correct patient, procedure, equipment, support staff and site/side marked as required      Procedures Details:   Indications: pain and diagnostic evaluation   Prep: chlorhexidine  Patient position: prone  Number of Joints: 2  Guidance: fluoroscopy  Needle size: 25 G  Patient tolerance: Patient tolerated the procedure well with no immediate complications. Pressure was applied, and hemostasis was accomplished.  Comments:   Cervical Medial Branch Blocks with Fluoroscopy (AP Approach)    PROCEDURE:  1) C5-C7 bilateral medial branch blocks  2) Fluoroscopic needle guidance    REASON FOR PROCEDURE: Cervical spondylosis without myelopathy or radiculopathy, DDD (cervical), facet arthropathy    PHYSICIAN: Evelina Bucy, MD, MBA    MEDICATIONS INJECTED: 0.4 mL of 0.5% bupivacaine at each level    LOCAL ANESTHETIC INJECTED: Lidocaine 1%, 0.2ml at each site    SEDATION MEDICATIONS: None    ESTIMATED BLOOD LOSS: None    COMPLICATIONS: None    TECHNIQUE: Time-out was taken to identify the correct patient, procedure and side prior to starting the procedure. Lying in a prone position, the patient was prepped and draped in the usual sterile fashion using DuraPrep and a fenestrated drape. The level was determined under AP fluoroscopy.     The 25 gauge 3.5 Quincke needle was introduced to the anatomic location of the medial branch at the waist of each vertebral level utilizing intermittent fluoroscopy. Medication was then injected slowly following negative aspiration.    The procedure was completed without complications and was tolerated well. The patient was monitored after the procedure. The patient (or responsible party) was given post-procedure and discharge instructions to follow at home. The patient was discharged in stable condition.            Administrations This Visit     bupivacaine PF (MARCAINE) 0.5 % injection 3 mL     Admin Date  11/17/2020 Action  Given Dose  3 mL Route  Injection Administered By  Evelina Bucy, MD              Estimated blood loss: none or minimal  Specimens: none  Patient tolerated the procedure well with no immediate complications. Pressure was applied, and hemostasis was accomplished.

## 2020-11-17 NOTE — Patient Instructions
Discharge Instructions for Medial Branch Block    Important information following your procedure today: You may drive today    This injection is for diagnostic purposes, it is a test. Only short term results are expected.    Though the procedure is generally safe and complications are rare, we do ask that you be aware of any of the following:   Any swelling, persistent redness, new bleeding, or drainage from the site of the injection.  You should not experience a severe headache.  You should not run a fever over 101? F.  New onset of sharp, severe back & or neck pain.  New onset of upper or lower extremity numbness or weakness.  New difficulty controlling bowel or bladder function after the injection.  New shortness of breath.    If any of these occur, please call to report this occurrence to Dr. Lourdes Sledge at [phone]. If you are calling after 4:00 p.m. or on weekends or holidays please call [phone] and ask to have the resident physician on call for the physician paged or go to your local emergency room.  You may experience soreness at the injection site. Ice can be applied at 20 minute intervals. Avoid application of direct heat, hot showers or hot tubs today.  Patients taking a daily blood thinner can resume their regular dose this evening.  It is important that you take all medications ordered by your pain physician. Taking medication as ordered is an important part of your pain care plan. If you cannot continue the medication plan, please notify the physician.   Remain active today. Do the activities that would normally cause you pain.  It is important for you to keep track of the results of this test on paper.  Did you get relief?  Percentage of relief?  How long did it last?  Call back to report the results to a nurse on TUESDAY at Dr. Lourdes Sledge at [phone]. Use notes that you kept when giving your report. You may have to leave a message and a nurse will contact you to help you determine if you are a candidate for the Radiofrequency Ablation Procedure. After the injection, you should get at least 80% relief for up to 1 hour.      The following medications were used: Lidocaine  and Bupivicaine      PAIN DIARY  Please report percentage of relief for each hour listed following discharge.  TIME DAY OF PROCEDURE LOCATION OF PAIN   Pain Level  Prior to Procedure     9:00 AM     10:00     11:00     12:00 (NOON)     1:00 PM     2:00     3:00     4:00     5:00     6:00     7:00     8:00     9:00     10:00     11:00 PM     12:00 AM (MIDNIGHT)              If you are unable to keep your upcoming appointment, please notify the [facility] scheduler at [phone] at least 24 hours in advance.

## 2020-11-18 ENCOUNTER — Encounter: Admit: 2020-11-18 | Discharge: 2020-11-18 | Payer: MEDICARE

## 2020-11-18 DIAGNOSIS — M47812 Spondylosis without myelopathy or radiculopathy, cervical region: Secondary | ICD-10-CM

## 2020-11-18 NOTE — Telephone Encounter
Patient called to report pain diary status post Facet Joint Injection MBB #2    Prior to procedure 2/10  Location: neck  Procedure completed approximately 1230 (per patient report)    Pre-procedure pain level returned at 2130    Experienced 100% of relief for 9 hours in neck

## 2020-11-20 ENCOUNTER — Encounter: Admit: 2020-11-20 | Discharge: 2020-11-20 | Payer: MEDICARE

## 2020-11-20 NOTE — Telephone Encounter
Pt calls with questions about RFA. LM with instructions. No food for 6 hours before. Only sips of water up to 2 hours before. Driver required. Asked pt to call if she has any more questions.

## 2020-11-21 ENCOUNTER — Encounter: Admit: 2020-11-21 | Discharge: 2020-11-21 | Payer: MEDICARE

## 2020-11-24 ENCOUNTER — Ambulatory Visit: Admit: 2020-11-24 | Discharge: 2020-11-24 | Payer: MEDICARE

## 2020-11-24 ENCOUNTER — Encounter: Admit: 2020-11-24 | Discharge: 2020-11-24 | Payer: MEDICARE

## 2020-11-24 DIAGNOSIS — M5030 Other cervical disc degeneration, unspecified cervical region: Secondary | ICD-10-CM

## 2020-11-24 DIAGNOSIS — M47812 Spondylosis without myelopathy or radiculopathy, cervical region: Secondary | ICD-10-CM

## 2020-11-24 DIAGNOSIS — M47819 Spondylosis without myelopathy or radiculopathy, site unspecified: Secondary | ICD-10-CM

## 2020-11-24 MED ORDER — MIDAZOLAM 1 MG/ML IJ SOLN
1 mg | INTRAVENOUS | 0 refills | Status: DC | PRN
Start: 2020-11-24 — End: 2020-11-24
  Administered 2020-11-24 (×2): 1 mg via INTRAVENOUS

## 2020-11-24 MED ORDER — FENTANYL CITRATE (PF) 50 MCG/ML IJ SOLN
50-100 ug | INTRAVENOUS | 0 refills | Status: DC | PRN
Start: 2020-11-24 — End: 2020-11-24
  Administered 2020-11-24 (×2): 50 ug via INTRAVENOUS

## 2020-11-24 MED ORDER — DEXAMETHASONE SODIUM PHOS (PF) 10 MG/ML IJ EPIDURAL SOLN
10 mg | Freq: Once | EPIDURAL | 0 refills | Status: CP
Start: 2020-11-24 — End: ?
  Administered 2020-11-24: 18:00:00 10 mg via EPIDURAL

## 2020-11-24 MED ORDER — LIDOCAINE (PF) 20 MG/ML (2 %) IJ SOLN
5 mL | Freq: Once | INTRAMUSCULAR | 0 refills | Status: CP
Start: 2020-11-24 — End: ?
  Administered 2020-11-24: 18:00:00 100 mg via INTRAMUSCULAR

## 2020-11-24 NOTE — Progress Notes
Sedation physician present in room. Recent vitals and patient condition reviewed between sedation physician and nurse. Reassessment completed. Determination made to proceed with planned sedation.

## 2020-11-24 NOTE — Progress Notes
RADIO  FREQUENCY  ABLATION  PROCEDURE    Ground Location: Right Side  ______________________________________  Lead: 1  Location: Right/Left  C6  SENSORY STIMULATION  Positive reaction @ na volts  MOTOR STIMULATION TEST  Negative response @ na volts  ABLATION PROCEDURE  Time: 60 sec  Temperature 80 C  Impedance: 83 Ohms 88 Ohms  ___________________________________________  Lead: 2  Location:Right / Left C7  SENSORY STIMULATION  Positive reaction @ na volts  MOTOR STIMULATION TEST  Negative response @ na volts  ABLATION PROCEDURE  Time: 60 sec  Temperature 80 C  Impedance: 82 Ohms 88 Ohms  ___________________________________________  Lead: 3  Location:Right / Left T1  SENSORY STIMULATION  Positive reaction @ na volts  MOTOR STIMULATION TEST  Negative response @ na volts  ABLATION PROCEDURE  Time: 60 sec  Temperature 80 C  Impedance: 85 Ohms 85 Ohms  ___________________________________________

## 2020-11-24 NOTE — Procedures
Attending Surgeon: Evelina Bucy, MD    Anesthesia: Local    Pre-Procedure Diagnosis:   1. Spondylosis of cervical region without myelopathy or radiculopathy    2. DDD (degenerative disc disease), cervical    3. Facet arthropathy        Post-Procedure Diagnosis:   1. Spondylosis of cervical region without myelopathy or radiculopathy    2. DDD (degenerative disc disease), cervical    3. Facet arthropathy        Pain Score: Zero (denies pain at this time)    DESTRUCTION OF NERVE W/FLUORO CERVICAL/THORACIC    Laterality: bilateral    Location: Cervical/Thoracic -  C5, C6 and C7      Consent:   Consent obtained: written  Consent given by: patient  Risks discussed: bleeding, infection, nerve damage, no change or worsening in pain, weakness, sensation loss, reaction to medication and seizure  Alternatives discussed: alternative treatment, delayed treatment and no treatment  Discussed with patient the purpose of the treatment/procedure, other ways of treating my condition, including no treatment/ procedure and the risks and benefits of the alternatives. Patient has decided to proceed with treatment/procedure.        Universal Protocol:  Relevant documents: relevant documents present and verified  Test results: test results available and properly labeled  Imaging studies: imaging studies available  Required items: required blood products, implants, devices, and special equipment available  Site marked: the operative site was marked  Patient identity confirmed: Patient identify confirmed verbally with patient.        Time out: Immediately prior to procedure a time out was called to verify the correct patient, procedure, equipment, support staff and site/side marked as required      Procedures Details:     Prep: chlorhexidine    Sedation: anxiolysisPatient position: prone  Estimated Blood Loss: minimal  Specimens: none  Number of Levels Ablated: 2  Guidance: fluoroscopy  Needle size: 18 G  Active Needle Tip Length: 10mm  Neurolytic Technique: Radiofrequency Ablation    Motor Testing: Motor testing complete per protocol   Patient tolerance: Patient tolerated the procedure well with no immediate complications. Pressure was applied, and hemostasis was accomplished.  Comments:   CERVICAL MEDIAL BRANCH RADIOFREQUENCY ABLATION UNDER FLUOROSCOPY    PROCEDURE:  1) bilateral C6-T1 medial branch radiofrequency ablation (targeting the C6-7 and C7-T1 joints)  2) Fluoroscopic needle guidance    REASON FOR PROCEDURE: Cervical spondylosis without myleopathy or radiculopathy, facet arthropathy, DDD (cervical)    PHYSICIAN: Evelina Bucy, MD    MEDICATIONS INJECTED: 0.5 mL of 2% lidocaine with 1.66mg  dexamethasone was injected at each level.     LOCAL ANESTHETIC INJECTED: 1 mL of 1% lidocaine per site    SEDATION MEDICATIONS: Versed and Fentanyl as per nurse charting.    ESTIMATED BLOOD LOSS: None    SPECIMENS REMOVED: None    COMPLICATIONS: None    TECHNIQUE: Time-out was taken to identify the correct patient, procedure and side prior to starting the procedure. Lying in a prone position, the patient was prepped and draped in the usual sterile fashion using DuraPrep and a fenestrated drape. The levels were determined under fluoroscopy. Local anesthetic was given by raising a skin wheal and going down to the hub of a 27-gauge 1.25-inch needle. A 10mm tip radiofrequency needle was introduced to the anatomic location of the medial branch at the lateral mass of each cervical level utilizing intermittent fluoroscopy. Motor stimulation up to 1.5 volts was done to confirm no ablation of the  ventral ramus at each level. 0.5 mL of 2% lidocaine was then injected slowly at each level. After waiting 60 seconds, ablation was performed utilizing a radiofrequency generator at 80 degrees C for 60 seconds.     The procedure was completed without complications and was tolerated well. The patient was monitored after the procedure. The patient (or responsible party) was given post-procedure and discharge instructions to follow at home. The patient was discharged in stable condition.     Evelina Bucy, MD              Radiofrequency time 60  Radiofrequency Temperature 80        Administrations This Visit     dexamethasone PF (DECARDON) epidural injection 10 mg     Admin Date  11/24/2020 Action  Given Dose  10 mg Route  Epidural Administered By  Evelina Bucy, MD          fentaNYL citrate PF (SUBLIMAZE) injection 50-100 mcg     Admin Date  11/24/2020 Action  Given Dose  50 mcg Route  Intravenous Administered By  Kendall Flack, RN           Admin Date  11/24/2020 Action  Given Dose  50 mcg Route  Intravenous Administered By  Kendall Flack, RN          lidocaine PF 20 mg/mL (2 %) injection 100 mg     Admin Date  11/24/2020 Action  Given Dose  100 mg Route  Injection Administered By  Evelina Bucy, MD          midazolam (VERSED) injection 1 mg     Admin Date  11/24/2020 Action  Given Dose  1 mg Route  Intravenous Administered By  Kendall Flack, RN           Admin Date  11/24/2020 Action  Given Dose  1 mg Route  Intravenous Administered By  Kendall Flack, RN              Estimated blood loss: none or minimal  Specimens: none  Patient tolerated the procedure well with no immediate complications. Pressure was applied, and hemostasis was accomplished.

## 2020-11-24 NOTE — Progress Notes

## 2020-11-24 NOTE — Progress Notes
[facility]  INTERVENTIONAL PAIN PROCEDURE HISTORY AND PHYSICAL    Chief Complaint: Pain    HISTORY OF PRESENT ILLNESS:    Patient returns today for interventional treatment of axial neck pain. Patient denies any recent fevers, chills, infection, antibiotics, coagulopathy or contra-indicated anticoagulants. Risks of the procedure were discussed including but not limited to bleeding, infection, damage to surrounding structures and reaction to medications. Patient reports understanding and has elected to proceed with the procedure.      This patient had at least 80% pain relief with Biltaeral C6-T1 MBB x2.     Medial Branch Block Checklist:    Moderate to severe chronic neck/low back pain, predominately axial, that causes functional deficit measured on pain or disability scale.  Yes  Pain present for minimum of 3 months with documented failure to respond to noninvasive conservative management such as passage of time, rest, medication management     Absence of untreated radiculopathy  Yes *[x]   No []   Abscense of untreated neurogenic claudication (except for radiculopathy caused by facet joint synovial cyst)                        Yes *[x]   No []               There is no non-facet pathology per clinical assessment or radiology studies that could explain the source of the patient 's pain, including but not limited to fracture, tumor, infection, or significant deformity.             Previous Medial Branch Block injection was greater than 2 weeks      Yes  Previous radiculopathy (if present) treated by ESI injection Yes *[x]   No []      Has this level been previously ablated greater than 2 years (if prior ablation was performed) Yes *[x]   No []        This level has never had an Anterior Lumbar Interbody Fusion      Yes *[x]   No []                       Medical History:   Diagnosis Date   ? Generalized headaches    ? Migraines        Surgical History:   Procedure Laterality Date   ? ANKLE SURGERY     ? HEART CATHETERIZATION     ? TUBAL LIGATION         family history includes Arthritis in her mother; Cancer in her father; Cataract in her mother; Glaucoma in her mother.    Social History     Socioeconomic History   ? Marital status: Married   ? Highest education level: Some college, no degree   Tobacco Use   ? Smoking status: Never Smoker   ? Smokeless tobacco: Never Used   Vaping Use   ? Vaping Use: Never used   Substance and Sexual Activity   ? Alcohol use: Not Currently   ? Drug use: Never       Allergies   Allergen Reactions   ? Isometheptene CHEST TIGHTNESS, CHILLS and MUSCLE PAIN   ? Sulfa (Sulfonamide Antibiotics) UNKNOWN       There were no vitals filed for this visit.       REVIEW OF SYSTEMS: 10 point ROS obtained and negative except as above.      PHYSICAL EXAM:  General: Alert, cooperative, no acute distress.  HEENT: Normocephalic, atraumatic.  Neck:  Supple.  Lungs: Unlabored respirations, bilateral and equal chest excursion.  Heart: Regular rate.  Skin: Warm and dry to touch.  Abdomen: Nondistended.  MSK: No deformity  Neurological: Alert and oriented x3.        IMPRESSION:    1. Spondylosis of cervical region without myelopathy or radiculopathy    2. DDD (degenerative disc disease), cervical    3. Facet arthropathy         PLAN: Bilateral C6-T1 RFA    Facetogenic pain >3 mos  Failed to improve with conservative treatment including pharmacologic, passage of time, and exercise.   Imaging consistent with pain.   Other diagnoses ruled out.   Baseline Oswestry  20

## 2020-12-11 ENCOUNTER — Encounter: Admit: 2020-12-11 | Discharge: 2020-12-11 | Payer: MEDICARE

## 2020-12-12 ENCOUNTER — Encounter: Admit: 2020-12-12 | Discharge: 2020-12-12 | Payer: MEDICARE

## 2020-12-12 MED ORDER — METHYLPREDNISOLONE 4 MG PO DSPK
ORAL_TABLET | 0 refills | Status: AC
Start: 2020-12-12 — End: ?

## 2020-12-31 NOTE — Patient Instructions
General Instructions:  How to reach me: Please send a MyChart message to the [facility] or leave a voicemail for my nurse Rebecca at [phone].  Scheduling: Our scheduling phone number is [phone].  How to get a medication refill: Five business days before refill needed, please use the MyChart Refill request or contact your pharmacy directly to request medication refills.   How to receive your test results: If you have signed up for MyChart, you will receive your test results and messages from me this way. Otherwise, you will get a phone call or letter. If you are expecting results and have not heard from my office within 2 weeks of your testing, please send a MyChart message or call my office.  Support for many chronic illnesses is available through [facility]: [email or web address] or [phone].  For questions on nights, weekends or holidays, call the operator at [phone], and ask for the doctor on call for Anesthesia Pain Management.

## 2021-01-02 ENCOUNTER — Encounter: Admit: 2021-01-02 | Discharge: 2021-01-02 | Payer: MEDICARE

## 2021-01-05 ENCOUNTER — Ambulatory Visit: Admit: 2021-01-05 | Discharge: 2021-01-05 | Payer: MEDICARE

## 2021-01-05 ENCOUNTER — Encounter: Admit: 2021-01-05 | Discharge: 2021-01-05 | Payer: MEDICARE

## 2021-01-05 DIAGNOSIS — M47819 Spondylosis without myelopathy or radiculopathy, site unspecified: Secondary | ICD-10-CM

## 2021-01-05 DIAGNOSIS — M7918 Myalgia, other site: Secondary | ICD-10-CM

## 2021-01-05 DIAGNOSIS — M62838 Other muscle spasm: Secondary | ICD-10-CM

## 2021-01-05 DIAGNOSIS — G43909 Migraine, unspecified, not intractable, without status migrainosus: Secondary | ICD-10-CM

## 2021-01-05 DIAGNOSIS — M5412 Radiculopathy, cervical region: Secondary | ICD-10-CM

## 2021-01-05 DIAGNOSIS — M5030 Other cervical disc degeneration, unspecified cervical region: Secondary | ICD-10-CM

## 2021-01-05 DIAGNOSIS — R519 Generalized headaches: Secondary | ICD-10-CM

## 2021-01-05 MED ORDER — LIDOCAINE (PF) 20 MG/ML (2 %) IJ SOLN
4 mL | Freq: Once | INTRAMUSCULAR | 0 refills | Status: CP | PRN
Start: 2021-01-05 — End: ?
  Administered 2021-01-05: 15:00:00 4 mL via INTRAMUSCULAR

## 2021-01-05 MED ORDER — BUPIVACAINE (PF) 0.25 % (2.5 MG/ML) IJ SOLN
4 mL | Freq: Once | INTRAMUSCULAR | 0 refills | Status: CP | PRN
Start: 2021-01-05 — End: ?
  Administered 2021-01-05: 15:00:00 4 mL via INTRAMUSCULAR

## 2021-01-05 NOTE — Procedures
[place] [facility] TRIGGER POINT INJECTION  Locations: L upper trapezius, R upper trapezius, L levator scapulae, R levator scapulae, L cervical and R cervical  Consent:   Consent obtained: written and verbal  Consent given by: patient  Alternatives discussed: alternative treatment  Discussed with patient the purpose of the treatment/procedure, other ways of treating my condition, including no treatment/ procedure and the risks and benefits of the alternatives. Patient has decided to proceed with treatment/procedure.        Universal Protocol:  Relevant documents: relevant documents present and verified  Test results: test results available and properly labeled  Imaging studies: imaging studies not available  Required items: required blood products, implants, devices, and special equipment not available  Site marked: the operative site was not marked  Patient identity confirmed: Patient identify confirmed verbally with patient.        Time out: Immediately prior to procedure a "time out" was called to verify the correct patient, procedure, equipment, support staff and site/side marked as required      Procedures Details:   Indications: cervicalgia and myalgiaPrep: alcohol  Needle size: 27 G  Number of muscles: 3 or more  Approach: posterior  Medications administered: 4 mL bupivacaine PF 0.25 %; 4 mL lidocaine PF 20 mg/mL (2 %)  Patient tolerance: Patient tolerated the procedure well with no immediate complications. Pressure was applied, and hemostasis was accomplished.

## 2021-01-05 NOTE — Progress Notes
[facility]      Subjective     Chief Complaint: neck/head pain    Interval HPI: Kelsay Weisman is a 70 y.o. female who  has a past medical history of Generalized headaches and Migraines. who now presents for evaluation.    Patient presents to clinic for evaluation of low neck and TPI today.  ?  Patient reports about 50% relief after bilateral C6-T1 RFA performed on 11/24/2020. Patient states she has noted improvement of cervical ROM and less frequency of HAs. Patient has noted some increased sensitivity to left cervical/trapezius region since procedure, has gradually improved with time.    Patient reports 50% relief after cervical TPI performed on 09/15/2020. Patient states her trigger points are improved with the procedure and the relief last for about 3 months, then notes gradual return.     Patient reports her pain is relatively unchanged since last office visit.   ?  The pain is in the low neck region, will radiate to the base of the skull to the to the top to the head to the middle forehead and back of the eyes bilaterally  Will also have pain radiate to the shoulders bilaterally, L>R  Patient reports additional sharp stabbing pain noted just above the left eye (less frequent since RFA)  Denies BUE pain  Pain is constant  Numbness/tingling: none  The pain ranges 2-7/10 (avg mild)  The pain is described as dull, sore, stabbing, sharp   The pain is exacerbated by household chores, lifting arms above shoulder level  The pain is partially alleviated by rest, heating pad, ice, prescribed medications (gabapentin/Toradol)  +muscle stiffness/tightness  Denies strength loss in BUE/BLE  ?  Patient taking Gabapentin 600mg  TID and Baclofen as prescribed by PCP,   ?  Hx of spine surgery: none  ?  Patient denies fevers, chills, infection, bleeding issues, anticoagulants, new muscle weakness, numbness, tingling, bowel/bladder incontinence, or saddle anesthesia.       ROS:   Review of Systems   Musculoskeletal: Positive for back pain, myalgias and neck pain.   Neurological: Positive for headaches.        PRIOR MEDICATIONS:   Effective  Gabapentin  Baclofen      Ineffective  NSAID    Unable to tolerate  Nortriptyline    Never  Lyrica  Cymbalta  Tizanidine      PRIOR INTERVENTIONS:  No spine surgery   Effective  CESI  TPI  Supraorbital nerve blocks  Bilateral C6-T1 RFA    Ineffective  Physician-directed PT  Exercise        Berna Spare denies any recent fevers, chills, infection, antibiotics, bowel or bladder incontinence, saddle anesthesia, bleeding issues, or recent anticoagulant.       Past Medical History:  Medical History:   Diagnosis Date   ? Generalized headaches    ? Migraines        Family History:  Family History   Problem Relation Age of Onset   ? Arthritis Mother    ? Cataract Mother    ? Glaucoma Mother    ? Cancer Father    ? Amblyopia Neg Hx    ? Blindness Neg Hx    ? Strabismus Neg Hx    ? Retinal Detachment Neg Hx    ? Macular Degen Neg Hx        Social History:  [place] in [place]   Social History     Socioeconomic History   ?  Marital status: Married   ? Highest education level: Some college, no degree   Tobacco Use   ? Smoking status: Never Smoker   ? Smokeless tobacco: Never Used   Vaping Use   ? Vaping Use: Never used   Substance and Sexual Activity   ? Alcohol use: Not Currently   ? Drug use: Never       Allergies:  Allergies   Allergen Reactions   ? Isometheptene CHEST TIGHTNESS, CHILLS and MUSCLE PAIN   ? Sulfa (Sulfonamide Antibiotics) UNKNOWN       Medications:  Current Outpatient Medications on File Prior to Visit   Medication Sig Dispense Refill   ? alendronate (FOSAMAX) 70 mg tablet      ? baclofen (LIORESAL) 10 mg tablet Take 10 mg by mouth twice daily.     ? buPROPion SR(+) (WELLBUTRIN-SR) 150 mg tablet Take 150 mg by mouth twice daily.     ? calcium carbonate (CALCIUM 500 PO) Take  by mouth.     ? cholecalciferol (VITAMIN D-3) 1,000 units tablet Take 1,000 Units by mouth daily.     ? clonazePAM (KLONOPIN) 1 mg tablet Take one-half tablet by mouth at bedtime as needed.     ? ergocalciferol (vitamin D2) (DRISDOL) 1,250 mcg (50,000 unit) capsule Take one capsule by mouth every 7 days. 12 capsule 0   ? gabapentin (NEURONTIN) 600 mg tablet Take one tablet by mouth every 8 hours. 540 tablet 2   ? KETOROLAC TROMETHAMINE (KETOROLAC IJ) Inject  to area(s) as directed.     ? latanoprost (XALATAN) 0.005 % ophthalmic solution Apply one drop to both eyes at bedtime daily. 7.5 mL 3   ? methylPREDNIsolone (MEDROL (PAK)) 4 mg tablet Take medication as directed on package for 6 days. Take with food. 21 tablet 0   ? omeprazole DR(+) (PRILOSEC) 40 mg capsule Take 40 mg by mouth daily before breakfast.     ? pregabalin (LYRICA) 25 mg capsule Take one capsule by mouth three times daily. 90 capsule 3   ? rosuvastatin (CRESTOR) 5 mg tablet Take 5 mg by mouth at bedtime daily.       No current facility-administered medications on file prior to visit.       Physical examination:   BP 115/65  - Pulse 62  - Temp 36.7 ?C (98 ?F)  - Resp 18  - Ht 162.6 cm (5' 4)  - Wt 47.2 kg (104 lb)  - SpO2 100%  - BMI 17.85 kg/m?   Pain Score: Two    Female Opioid Risk Score: 0 (08/09/2018  8:42 AM)  Opioid Risk Category: Low Risk (08/09/2018  8:42 AM)    Exam performed by instructing patient to perform various maneuvers and provide feedback while I personally visualized the patient exam.     General: The patient is a well-developed, well nourished 70 y.o. female in no acute distress.   HEENT: Head is normocephalic and atraumatic.  Pulmonary: The patient has unlabored respirations and bilateral symmetric chest excursion. Mild TTP to right supraorbital region.   Abdomen: Soft, nontender, and nondistended. There is no rebound or guarding per patient assessment.  Extremities: No clubbing, cyanosis, or edema.    Neurologic:   The patient is alert and oriented times 3.    There is no sensory deficit to light touch or pinprick in the affected areas. There is no allodynia noted.    +mild- pain with palpation at the base of the occiput bilaterally, L>R, no radiating  pain noted    Moderate TTP to upper trapezius and levator scapulae with increased muscle tone bilaterally with trigger points and referred pain, L>R.     Musculoskeletal:   Gait is normal.    C-Spine    There is mild-moderate TTP to cervical paraspinal tenderness. Paraspinal muscle tone is increased with trigger points noted and referred pain with palpation.  ROM with flexion, extension, rotation, and lateral bending is intact but stiff mostly noted with extension.   Strength is equal and adequate bilaterally in the flexors and extensors of the bilateral upper extremities.    Hoffmann negative bilaterally.       MRI C-Spine Results:  11/2015  C2-3 mild disc desiccation  C3-4 mod loss of disc height  C4-5 mild disc dessication. Left paracentral disc protrusion. mild R FA.   C5-6 +FA. Broad DOC. Left foraminal and paracentral disc protrusion. Miild-mod L NFS. Mild R NFS. Some effacement of thecal sac.  C6-7 DDD.   Spondylosis most pronounced at C5-6.       Last Cr and LFT's:  Creatinine   Date Value Ref Range Status   03/02/2020 0.87 0.4 - 1.00 MG/DL Final     AST (SGOT)   Date Value Ref Range Status   03/01/2020 24 7 - 40 U/L Final     ALT (SGPT)   Date Value Ref Range Status   03/01/2020 19 7 - 56 U/L Final     Alk Phosphatase   Date Value Ref Range Status   03/01/2020 63 25 - 110 U/L Final     Total Bilirubin   Date Value Ref Range Status   03/01/2020 0.7 0.3 - 1.2 MG/DL Final          Assessment:    Morrigan Asti is a 70 y.o. female who  has a past medical history of Generalized headaches and Migraines. who presents for evaluation of neck pain.     The pain complaints are most likely due to:    1. Myalgia, other site     2. Muscle spasm     3. Cervical radiculopathy     4. DDD (degenerative disc disease), cervical     5. Facet arthropathy         Patient has had an adequate trial of > 3 months of rest, exercise, NSAID's, multimodal treatment, and the passage of time without improvement of symptoms. The pain has significant impact on the daily quality of life.     Plan:    1. Discussed plan of care options with patient. Will perform TPI today.  2. This patient had at least 50% pain relief for at least 6 months with the last epidural injection. Okay to repeat C7-T1 CESI at patient convenience.   3. Okay to repeat bilateral C6-T1 RFA after 05/26/2021.  4. Continue Gabapentin and Baclofen as prescribed by PCP.  5. May still consider supraorbital nerve blocks in future, L>R.  6. Follow-up as needed.     Risks/benefits of all pharmacologic and interventional treatments discussed and questions answered.

## 2021-01-19 ENCOUNTER — Encounter: Admit: 2021-01-19 | Discharge: 2021-01-19 | Payer: MEDICARE

## 2021-01-20 ENCOUNTER — Encounter: Admit: 2021-01-20 | Discharge: 2021-01-20 | Payer: MEDICARE

## 2021-01-20 DIAGNOSIS — M47812 Spondylosis without myelopathy or radiculopathy, cervical region: Secondary | ICD-10-CM

## 2021-01-20 DIAGNOSIS — M5412 Radiculopathy, cervical region: Secondary | ICD-10-CM

## 2021-01-20 DIAGNOSIS — M5030 Other cervical disc degeneration, unspecified cervical region: Secondary | ICD-10-CM

## 2021-02-17 ENCOUNTER — Encounter: Admit: 2021-02-17 | Discharge: 2021-02-17 | Payer: MEDICARE

## 2021-02-17 ENCOUNTER — Ambulatory Visit: Admit: 2021-02-17 | Discharge: 2021-02-17 | Payer: MEDICARE

## 2021-02-17 DIAGNOSIS — M5412 Radiculopathy, cervical region: Secondary | ICD-10-CM

## 2021-02-17 DIAGNOSIS — M5030 Other cervical disc degeneration, unspecified cervical region: Secondary | ICD-10-CM

## 2021-02-17 DIAGNOSIS — R52 Pain, unspecified: Secondary | ICD-10-CM

## 2021-02-17 DIAGNOSIS — R519 Generalized headaches: Secondary | ICD-10-CM

## 2021-02-17 DIAGNOSIS — G43909 Migraine, unspecified, not intractable, without status migrainosus: Secondary | ICD-10-CM

## 2021-02-17 MED ORDER — TRIAMCINOLONE ACETONIDE 40 MG/ML IJ SUSP
80 mg | Freq: Once | EPIDURAL | 0 refills | Status: CP
Start: 2021-02-17 — End: ?

## 2021-02-17 MED ORDER — LIDOCAINE (PF) 10 MG/ML (1 %) IJ SOLN
2 mL | Freq: Once | INTRAMUSCULAR | 0 refills | Status: CP
Start: 2021-02-17 — End: ?

## 2021-02-17 MED ORDER — IOHEXOL 240 MG IODINE/ML IV SOLN
1 mL | Freq: Once | EPIDURAL | 0 refills | Status: CP
Start: 2021-02-17 — End: ?

## 2021-02-17 NOTE — Discharge Instructions - Supplementary Instructions
GENERAL POST PROCEDURE INSTRUCTIONS  Physician: _________________________________  Procedure Completed Today:  Joint Injection (hip, knee, shoulder)  Cervical Epidural Steroid Injection  Cervical Transforaminal Steroid Injection  Trigger Point Injection  Caudal Epidural Steroid Injection  Pudendal Nerve Block  Other _____________________ Thoracic Epidural Steroid Injection  Lumbar Epidural Steroid Injection  Lumbar Transforaminal Steroid Injection  Facet Joint Injection  Celiac Nerve Block  Sacrococcygeal  Sacroiliac Joint Injection   Important information following your procedure today:  You may drive today     If you had sedation, you may NOT drive today  Rest at home for the next 6 hours.  You may then begin to resume your normal activities.  DO NOT drive any vehicle, operate any power tools, drink alcohol, make any major decisions, or sign any legal documents for the next 12 hours.  Pain relief may not be immediate. It is possible you may even experience an increase in pain during the first 24-48 hours followed by a gradual decrease of your pain.  Though the procedure is generally safe, and complications are rare, we do ask that you be aware of any of the following:  Any swelling, persistent redness, new bleeding or drainage from the site of the injection.  You should not experience a severe headache.  You should not run a fever over 101oF.  New onset of sharp, severe back and or neck pain.  New onset of upper or lower extremity numbness or weakness.  New difficulty controlling bowel or bladder function after injection.  New shortness of breath.  ** If any of these occur, please call to report this occurrence to a nurse at [phone]. If you are calling after 4:00 p.m. or on weekends or holidays, please call [phone] and ask to have the resident physician on call for the physician paged or go to your local emergency room.  You may experience soreness at the injection site. Ice can be applied at 20-minute intervals for the first 24 hours. The following day you may alternate ice with heat if you are experiencing muscle tightness, otherwise continue with ice. Ice works best at decreasing pain. Avoid application of direct heat, hot showers or hot tubs today.  Avoid strenuous activity today. You many resume your regular activities and exercise tomorrow.  Patients with diabetes may see an elevation in blood sugars for 7-10 days after the injection. It is important to pay close attention to your diet, check your blood sugars daily and report extreme elevations to the physician that manages your diabetes.  Patients taking daily blood thinners can resume their regular dose this evening.  It is important that you take all medications ordered by your pain physician. Taking medications as ordered is an important part of your pain care plan. If you cannot continue the medication plan, please notify the physician.    Possible side effects to steroids that may occur:  Flushing or redness of the face  Irritability  Fluid retention  Change in women's menses  Minor headache    If you are unable to keep your upcoming appointment, please notify the [facility] scheduler at [phone] at least 24 hours in advance. If you have questions for the surgery center, call [facility] at [phone].

## 2021-02-17 NOTE — Progress Notes
[facility]  INTERVENTIONAL PAIN PROCEDURE HISTORY AND PHYSICAL    Chief Complaint: Pain    HISTORY OF PRESENT ILLNESS:  Neck pain with UE radicular pain    Medical History:   Diagnosis Date    Generalized headaches     Migraines        Surgical History:   Procedure Laterality Date    ANKLE SURGERY      HEART CATHETERIZATION      TUBAL LIGATION         family history includes Arthritis in her mother; Cancer in her father; Cataract in her mother; Glaucoma in her mother.    Social History     Socioeconomic History    Marital status: Married    Highest education level: Some college, no degree   Tobacco Use    Smoking status: Never    Smokeless tobacco: Never   Vaping Use    Vaping Use: Never used   Substance and Sexual Activity    Alcohol use: Not Currently    Drug use: Never       Allergies   Allergen Reactions    Isometheptene CHEST TIGHTNESS, CHILLS and MUSCLE PAIN    Sulfa (Sulfonamide Antibiotics) UNKNOWN       There were no vitals filed for this visit.          REVIEW OF SYSTEMS: 10 point ROS obtained and negative      PHYSICAL EXAM:    General: Alert, cooperative, no acute distress.  HEENT: Normocephalic, atraumatic.  Neck: Supple.  Lungs: Unlabored respirations, bilateral and equal chest excursion.  Heart: Regular rate.  Skin: Warm and dry to touch.  Abdomen: Nondistended.  MSK: No deformity.  Neurological: Alert and oriented x3.        IMPRESSION:    1. Cervical radiculopathy    2. DDD (degenerative disc disease), cervical         PLAN: Cervical Epidural Steroid Injection C7-T1    She had at least 50% pain relief for at least 6 months with the last epidural injection.

## 2021-02-17 NOTE — Procedures
Attending Surgeon: Evelina Bucy, MD    Anesthesia: Local    Pre-Procedure Diagnosis:   1. Cervical radiculopathy    2. DDD (degenerative disc disease), cervical        Post-Procedure Diagnosis:   1. Cervical radiculopathy    2. DDD (degenerative disc disease), cervical             ESI CRV/THRC Injection  Procedure: epidural - interlaminar    Laterality: n/a   on 02/17/2021 1:02 PM  Location: cervical ESI with imaging -  C7-T1      Consent:   Consent obtained: written  Consent given by: patient  Risks discussed: allergic reaction, bleeding, bruising, infection, never damage, no change or worsening in pain, pneumo thorax, reaction to medication, seizure, swelling and weakness  Alternatives discussed: alternative treatment, delayed treatment and no treatment  Discussed with patient the purpose of the treatment/procedure, other ways of treating my condition, including no treatment/ procedure and the risks and benefits of the alternatives. Patient has decided to proceed with treatment/procedure.        Universal Protocol:  Relevant documents: relevant documents present and verified  Site marked: the operative site was marked  Patient identity confirmed: Patient identify confirmed verbally with patient.        Time out: Immediately prior to procedure a time out was called to verify the correct patient, procedure, equipment, support staff and site/side marked as required      Procedures Details:   Indications: pain   Prep: chlorhexidine  Patient position: prone  Estimated Blood Loss: minimal  Specimens: none  Number of Levels: 1  Approach: midline  Guidance: fluoroscopy  Contrast: Procedure confirmed with contrast under live fluoroscopy.  Needle and Epidural Catheter: tuohy  Needle size: 18 G  Injection procedure: Incremental injection and Negative aspiration for blood  Amount Injected:   C7-T1: 4mL  Patient tolerance: Patient tolerated the procedure well with no immediate complications. Pressure was applied, and hemostasis was accomplished.  Outcome: Pain unchanged  Comments:   CERVICAL INTERLAMINAR EPIDURAL STEROID INJECTION    PROCEDURE:  1) C7-T1 interlaminar epidural steroid injection  2) Fluoroscopic needle guidance    REASON FOR PROCEDURE: Cervical radiculopathy, DDD (cervical)    ATTENDING PHYSICIAN: Evelina Bucy, MD    MEDICATIONS INJECTED: 2 mL of triamciniolone (80 mg) and 2 mL of sterile, preservative-free normal saline    LOCAL ANESTHETIC INJECTED: 1 mL of 1% lidocaine    SEDATION MEDICATIONS: None    ESTIMATED BLOOD LOSS: None    SPECIMENS REMOVED: None    COMPLICATIONS: None    TECHNIQUE: Time-out was taken to identify the correct patient, procedure and side prior to starting the procedure. With the patient lying in a prone position with the neck in a slightly  flexed position, the area was prepped and draped in sterile fashion using DuraPrep and a fenestrated drape. The area was determined under fluoroscopic guidance. A 27-gauge, 1.25-inch  needle was used to anesthetize the needle entry site and subcutaneous tissues.     The 18-gauge, 3.5-inch Tuohy needle was advanced through the ligamentum flavum using loss of resistance technique. Once the tip of the needle was thought to be in the desired position, contrast was injected to confirm only epidural spread and no vascular runoff via A-P and lateral  views. The injectate was then injected slowly with intermittent negative aspiration.    The procedure was completed without complications and was tolerated well. The patient was monitored after the procedure. The patient (or responsible  party) was given post-procedure and  discharge instructions to follow at home. The patient was discharged in stable condition.    Evelina Bucy, MD, MBA          Administrations This Visit     iohexoL (OMNIPAQUE-240) 240 mg/mL injection 1 mL     Admin Date  02/17/2021 Action  Given Dose  1 mL Route  Epidural Administered By  Evelina Bucy, MD          lidocaine PF 1% (10 mg/mL) injection 2 mL     Admin Date  02/17/2021 Action  Given Dose  2 mL Route  Injection Administered By  Evelina Bucy, MD          triamcinolone acetonide (KENALOG-40) injection 80 mg     Admin Date  02/17/2021 Action  Given Dose  80 mg Route  Epidural Administered By  Evelina Bucy, MD              Estimated blood loss: none or minimal  Specimens: none  Patient tolerated the procedure well with no immediate complications. Pressure was applied, and hemostasis was accomplished.

## 2021-02-18 ENCOUNTER — Encounter: Admit: 2021-02-18 | Discharge: 2021-02-18 | Payer: MEDICARE

## 2021-03-13 ENCOUNTER — Ambulatory Visit: Admit: 2021-03-13 | Discharge: 2021-03-14 | Payer: MEDICARE

## 2021-03-13 ENCOUNTER — Encounter: Admit: 2021-03-13 | Discharge: 2021-03-13 | Payer: MEDICARE

## 2021-03-13 DIAGNOSIS — M47819 Spondylosis without myelopathy or radiculopathy, site unspecified: Secondary | ICD-10-CM

## 2021-03-13 DIAGNOSIS — M5412 Radiculopathy, cervical region: Secondary | ICD-10-CM

## 2021-03-13 DIAGNOSIS — M7918 Myalgia, other site: Secondary | ICD-10-CM

## 2021-03-13 DIAGNOSIS — M5030 Other cervical disc degeneration, unspecified cervical region: Secondary | ICD-10-CM

## 2021-03-13 NOTE — Progress Notes
Obtained patient's verbal consent to treat them and their agreement to [facility] financial policy and NPP via this telehealth visit during the [facility]    [facility] - Interventional Pain      Subjective     Chief Complaint: neck/head pain    Interval HPI: Rita Gibson is a 71 y.o. female who  has a past medical history of Generalized headaches and Migraines. who now presents for evaluation.    Patient presents to clinic for evaluation of neck.    Patient states reports almost 100% relief of left neck and trapezius pain after TPI performed on 01/05/2021.     Patient reports over 50% after C7-T1 CESI performed on 02/17/2021, patient states her pain is significantly less severe and rarely has pain over 2/10. She is happy with level of relief.     Patient states she is still getting improvement of less frequent HAs and eye pain after bilateral C6-T1 RFA performed on 11/24/2020.       The pain is in the low neck region, will radiate to the base of the skull to the to the top to the head to the middle forehead and back of the eyes bilaterally  Will also have pain radiate to the shoulders bilaterally, L>R  Patient reports additional sharp stabbing pain noted just under her eyebrow   Denies BUE pain  Pain is constant  Numbness/tingling: none  The pain ranges 1-3/10 (avg 1-2/10)  The pain is described as dull, sore, stabbing, sharp   The pain is exacerbated by household chores, lifting arms above shoulder level  The pain is partially alleviated by rest, heating pad, ice, prescribed medications (gabapentin/Toradol)  +muscle stiffness/tightness  Denies strength loss in BUE/BLE  ?  Patient taking Lyrica 25mg  morning and afternoon, she has noted increased wakefulness but unsure of level of relief from medication. She states she was also decreased on her klonopin dose at that time.     She is also taking Klonopin and Baclofen as prescribed by PCP.  ?  Hx of spine surgery: none  ?  Patient denies fevers, chills, infection, bleeding issues, anticoagulants, new muscle weakness, numbness, tingling, bowel/bladder incontinence, or saddle anesthesia.       ROS:   Review of Systems   Musculoskeletal: Positive for back pain, myalgias and neck pain.   Neurological: Positive for headaches.        PRIOR MEDICATIONS:   Effective  Gabapentin  Baclofen      Ineffective  NSAID    Unable to tolerate  Nortriptyline    Never  Lyrica  Cymbalta  Tizanidine      PRIOR INTERVENTIONS:  No spine surgery   Effective  CESI  TPI  Supraorbital nerve blocks  Bilateral C6-T1 RFA    Ineffective  Physician-directed PT  Exercise        Rita Gibson denies any recent fevers, chills, infection, antibiotics, bowel or bladder incontinence, saddle anesthesia, bleeding issues, or recent anticoagulant.       Past Medical History:  Medical History:   Diagnosis Date   ? Generalized headaches    ? Migraines        Family History:  Family History   Problem Relation Age of Onset   ? Arthritis Mother    ? Cataract Mother    ? Glaucoma Mother    ? Cancer Father    ? Amblyopia Neg Hx    ? Blindness Neg Hx    ? Strabismus  Neg Hx    ? Retinal Detachment Neg Hx    ? Macular Degen Neg Hx        Social History:  [place]   Social History     Socioeconomic History   ? Marital status: Married   ? Highest education level: Some college, no degree   Tobacco Use   ? Smoking status: Never   ? Smokeless tobacco: Never   Vaping Use   ? Vaping Use: Never used   Substance and Sexual Activity   ? Alcohol use: Not Currently   ? Drug use: Never       Allergies:  Allergies   Allergen Reactions   ? Isometheptene CHEST TIGHTNESS, CHILLS and MUSCLE PAIN   ? Sulfa (Sulfonamide Antibiotics) UNKNOWN       Medications:  Current Outpatient Medications on File Prior to Visit   Medication Sig Dispense Refill   ? alendronate (FOSAMAX) 70 mg tablet      ? baclofen (LIORESAL) 10 mg tablet Take 10 mg by mouth twice daily.     ? buPROPion SR(+) (WELLBUTRIN-SR) 150 mg tablet Take 150 mg by mouth twice daily.     ? calcium carbonate (CALCIUM 500 PO) Take  by mouth.     ? cholecalciferol (VITAMIN D-3) 1,000 units tablet Take 1,000 Units by mouth daily.     ? clonazePAM (KLONOPIN) 1 mg tablet Take one-half tablet by mouth at bedtime as needed.     ? ergocalciferol (vitamin D2) (DRISDOL) 1,250 mcg (50,000 unit) capsule Take one capsule by mouth every 7 days. 12 capsule 0   ? KETOROLAC TROMETHAMINE (KETOROLAC IJ) Inject  to area(s) as directed.     ? latanoprost (XALATAN) 0.005 % ophthalmic solution Apply one drop to both eyes at bedtime daily. 7.5 mL 3   ? methylPREDNIsolone (MEDROL (PAK)) 4 mg tablet Take medication as directed on package for 6 days. Take with food. 21 tablet 0   ? omeprazole DR(+) (PRILOSEC) 40 mg capsule Take 40 mg by mouth daily before breakfast.     ? pregabalin (LYRICA) 25 mg capsule Take one capsule by mouth three times daily. 90 capsule 3   ? rosuvastatin (CRESTOR) 5 mg tablet Take 5 mg by mouth at bedtime daily.       No current facility-administered medications on file prior to visit.       Physical examination:   Ht 162.6 cm (5' 4)  - Wt 47.6 kg (105 lb)  - BMI 18.02 kg/m?   Pain Score: Zero    Female Opioid Risk Score: 0 (08/09/2018  8:42 AM)  Opioid Risk Category: Low Risk (08/09/2018  8:42 AM)    Exam performed by instructing patient to perform various maneuvers and provide feedback while I personally visualized the patient exam.     General: The patient is a well-developed, well nourished 71 y.o. female in no acute distress.   HEENT: Head is normocephalic and atraumatic.  Pulmonary: The patient has unlabored respirations and bilateral symmetric chest excursion. Mild TTP to right supraorbital region.   Abdomen: Soft, nontender, and nondistended. There is no rebound or guarding per patient assessment.  Extremities: No clubbing, cyanosis, or edema as seen on video.     Neurologic:   The patient is alert and oriented times 3. +minimal pain with palpation at the base of the occiput bilaterally, L>R, no radiating pain noted    Minimal TTP to upper trapezius and levator scapulae with increased muscle tone bilaterally with  trigger points and referred pain, L>R.     Musculoskeletal:   Gait is normal.    C-Spine    There is mild TTP to cervical paraspinal tenderness. Paraspinal muscle tone is increased with trigger points noted and referred pain with palpation.  ROM with flexion, extension, rotation, and lateral bending is intact but stiff mostly noted with extension.   Patient believes strength is equal and adequate bilaterally in the flexors and extensors of the bilateral upper extremities.          MRI C-Spine Results:  11/2015  C2-3 mild disc desiccation  C3-4 mod loss of disc height  C4-5 mild disc dessication. Left paracentral disc protrusion. mild R FA.   C5-6 +FA. Broad DOC. Left foraminal and paracentral disc protrusion. Miild-mod L NFS. Mild R NFS. Some effacement of thecal sac.  C6-7 DDD.   Spondylosis most pronounced at C5-6.       Last Cr and LFT's:  Creatinine   Date Value Ref Range Status   03/02/2020 0.87 0.4 - 1.00 MG/DL Final     AST (SGOT)   Date Value Ref Range Status   03/01/2020 24 7 - 40 U/L Final     ALT (SGPT)   Date Value Ref Range Status   03/01/2020 19 7 - 56 U/L Final     Alk Phosphatase   Date Value Ref Range Status   03/01/2020 63 25 - 110 U/L Final     Total Bilirubin   Date Value Ref Range Status   03/01/2020 0.7 0.3 - 1.2 MG/DL Final          Assessment:    Rita Gibson is a 71 y.o. female who  has a past medical history of Generalized headaches and Migraines. who presents for evaluation of neck pain.     The pain complaints are most likely due to:    1. Cervical radiculopathy  [place] AMB SPINE INJECT INTERLAM CRV/THRC      2. DDD (degenerative disc disease), cervical  [place] AMB SPINE INJECT INTERLAM CRV/THRC      3. Facet arthropathy        4. Myalgia, other site            Patient has had an adequate trial of > 3 months of rest, exercise, NSAID's, multimodal treatment, and the passage of time without improvement of symptoms. The pain has significant impact on the daily quality of life.     Plan:    1. Discussed plan of care options with patient.  This patient had at least 50% pain relief for at least 6 months with the last epidural injection. Okay to repeat C7-T1 CESI in 2 months.   2. Will slowly taper Lyrica to determine level of benefit and improvement of potential SEs.   3. Okay to repeat cervical TPI and bilateral C6-T1 RFA after 05/26/2021, if patent   4. Continue Baclofen as prescribed by PCP.  5. May still consider supraorbital nerve blocks in future, L>R.  6. Follow-up as needed.     Risks/benefits of all pharmacologic and interventional treatments discussed and questions answered.     Todays visit took place via face-to-face encounter utilizing Zoom application. Visit Start Time 1533 Visit End Time 1550.

## 2021-04-13 ENCOUNTER — Encounter: Admit: 2021-04-13 | Discharge: 2021-04-13 | Payer: MEDICARE

## 2021-04-13 DIAGNOSIS — M7918 Myalgia, other site: Secondary | ICD-10-CM

## 2021-04-13 DIAGNOSIS — M5412 Radiculopathy, cervical region: Secondary | ICD-10-CM

## 2021-05-13 ENCOUNTER — Encounter: Admit: 2021-05-13 | Discharge: 2021-05-13 | Payer: MEDICARE

## 2021-05-13 DIAGNOSIS — Z1231 Encounter for screening mammogram for malignant neoplasm of breast: Secondary | ICD-10-CM

## 2021-05-22 ENCOUNTER — Encounter: Admit: 2021-05-22 | Discharge: 2021-05-22 | Payer: MEDICARE

## 2021-05-22 DIAGNOSIS — M5412 Radiculopathy, cervical region: Secondary | ICD-10-CM

## 2021-05-25 ENCOUNTER — Encounter: Admit: 2021-05-25 | Discharge: 2021-05-25 | Payer: MEDICARE

## 2021-05-25 ENCOUNTER — Ambulatory Visit: Admit: 2021-05-25 | Discharge: 2021-05-25 | Payer: MEDICARE

## 2021-05-25 DIAGNOSIS — R922 Inconclusive mammogram: Secondary | ICD-10-CM

## 2021-05-25 DIAGNOSIS — M5412 Radiculopathy, cervical region: Secondary | ICD-10-CM

## 2021-05-25 DIAGNOSIS — M5030 Other cervical disc degeneration, unspecified cervical region: Secondary | ICD-10-CM

## 2021-05-25 DIAGNOSIS — G43909 Migraine, unspecified, not intractable, without status migrainosus: Secondary | ICD-10-CM

## 2021-05-25 DIAGNOSIS — R519 Generalized headaches: Secondary | ICD-10-CM

## 2021-05-25 MED ORDER — IOHEXOL 240 MG IODINE/ML IV SOLN
2.5 mL | Freq: Once | EPIDURAL | 0 refills | Status: CP
Start: 2021-05-25 — End: ?
  Administered 2021-05-25: 18:00:00 2.5 mL via EPIDURAL

## 2021-05-25 MED ORDER — TRIAMCINOLONE ACETONIDE 40 MG/ML IJ SUSP
80 mg | Freq: Once | EPIDURAL | 0 refills | Status: CP
Start: 2021-05-25 — End: ?
  Administered 2021-05-25: 18:00:00 80 mg via EPIDURAL

## 2021-05-25 MED ORDER — SODIUM CHLORIDE 0.9 % IJ SOLN
3 mL | Freq: Once | INTRAMUSCULAR | 0 refills | Status: DC
Start: 2021-05-25 — End: 2021-05-25

## 2021-05-25 NOTE — Procedures
Attending Surgeon: Evelina Bucy, MD    Anesthesia: Local    Pre-Procedure Diagnosis:   1. Cervical radiculopathy    2. DDD (degenerative disc disease), cervical        Post-Procedure Diagnosis:   1. Cervical radiculopathy    2. DDD (degenerative disc disease), cervical        Pain Score: Three    ESI CRV/THRC  Procedure: epidural - interlaminar    Laterality: n/a   on 05/25/2021 12:30 PM  Location: cervical ESI with imaging -  C7-T1      Consent:   Consent obtained: written  Consent given by: patient  Risks discussed: allergic reaction, bleeding, bruising, infection, never damage, no change or worsening in pain, pneumo thorax, reaction to medication, seizure, swelling and weakness  Alternatives discussed: alternative treatment, delayed treatment and no treatment  Discussed with patient the purpose of the treatment/procedure, other ways of treating my condition, including no treatment/ procedure and the risks and benefits of the alternatives. Patient has decided to proceed with treatment/procedure.        Universal Protocol:  Relevant documents: relevant documents present and verified  Site marked: the operative site was marked  Patient identity confirmed: Patient identify confirmed verbally with patient.        Time out: Immediately prior to procedure a time out was called to verify the correct patient, procedure, equipment, support staff and site/side marked as required      Procedures Details:   Indications: pain   Prep: chlorhexidine  Patient position: prone  Estimated Blood Loss: minimal  Specimens: none  Number of Levels: 1  Approach: midline  Guidance: fluoroscopy  Contrast: Procedure confirmed with contrast under live fluoroscopy.  Needle and Epidural Catheter: tuohy  Needle size: 18 G  Injection procedure: Incremental injection and Negative aspiration for blood  Amount Injected:   C7-T1: 4mL  Patient tolerance: Patient tolerated the procedure well with no immediate complications. Pressure was applied, and hemostasis was accomplished.  Outcome: Pain unchanged  Comments:   CERVICAL INTERLAMINAR EPIDURAL STEROID INJECTION    PROCEDURE:  1) C7-T1 interlaminar epidural steroid injection  2) Fluoroscopic needle guidance    REASON FOR PROCEDURE: Cervical radiculopathy, DDD (cervical)    ATTENDING PHYSICIAN: Evelina Bucy, MD    MEDICATIONS INJECTED: 2 mL of triamciniolone (80 mg) and 2 mL of sterile, preservative-free normal saline    LOCAL ANESTHETIC INJECTED: 1 mL of 1% lidocaine    SEDATION MEDICATIONS: None    ESTIMATED BLOOD LOSS: None    SPECIMENS REMOVED: None    COMPLICATIONS: None    TECHNIQUE: Time-out was taken to identify the correct patient, procedure and side prior to starting the procedure. With the patient lying in a prone position with the neck in a slightly  flexed position, the area was prepped and draped in sterile fashion using DuraPrep and a fenestrated drape. The area was determined under fluoroscopic guidance. A 27-gauge, 1.25-inch  needle was used to anesthetize the needle entry site and subcutaneous tissues.     The 18-gauge, 3.5-inch Tuohy needle was advanced through the ligamentum flavum using loss of resistance technique. Once the tip of the needle was thought to be in the desired position, contrast was injected to confirm only epidural spread and no vascular runoff via A-P and lateral  views. The injectate was then injected slowly with intermittent negative aspiration.    The procedure was completed without complications and was tolerated well. The patient was monitored after the procedure. The patient (or responsible  party) was given post-procedure and  discharge instructions to follow at home. The patient was discharged in stable condition.    Evelina Bucy, MD, MBA          Administrations This Visit     iohexoL (OMNIPAQUE-240) 240 mg/mL injection 2.5 mL     Admin Date  05/25/2021 Action  Given Dose  2.5 mL Route  Epidural Administered By  Evelina Bucy, MD          triamcinolone acetonide (KENALOG-40) injection 80 mg     Admin Date  05/25/2021 Action  Given Dose  80 mg Route  Epidural Administered By  Evelina Bucy, MD              Estimated blood loss: none or minimal  Specimens: none  Patient tolerated the procedure well with no immediate complications. Pressure was applied, and hemostasis was accomplished.

## 2021-05-25 NOTE — Progress Notes

## 2021-05-25 NOTE — Progress Notes
[facility]INE [facility]ENTER  INTERVENTIONAL PAIN PROCEDURE HISTORY AND PHYSICAL    Chief Complaint: Pain    HISTORY OF PRESENT ILLNESS:    Patient returns today for interventional treatment of radicular pain. Patient denies any recent fevers, chills, infection, antibiotics, coagulopathy or contra-indicated anticoagulants. Risks of the procedure were discussed including but not limited to bleeding, infection, damage to surrounding structures and reaction to medications. Patient reports understanding and has elected to proceed with the procedure.      This patient had at least 50% pain relief for at least 3 months with the last epidural injection. The pain score was 9/10 prior to the injection and 3/10 following the injection.    This patient's clinical history, exam, AND imaging support radiculopathy AND there is a significant impact on quality of life and function AND their pain score has been documented in this note AND the pain has been present for at least 4 weeks AND they have failed to improve with noninvasive conservative care.         Medical History:   Diagnosis Date   ? Generalized headaches    ? Migraines        Surgical History:   Procedure Laterality Date   ? ANKLE SURGERY     ? HEART CATHETERIZATION     ? TUBAL LIGATION         family history includes Arthritis in her mother; Cancer in her father; Cataract in her mother; Glaucoma in her mother.    Social History     Socioeconomic History   ? Marital status: Married   ? Highest education level: Some college, no degree   Tobacco Use   ? Smoking status: Never   ? Smokeless tobacco: Never   Vaping Use   ? Vaping Use: Never used   Substance and Sexual Activity   ? Alcohol use: Not Currently   ? Drug use: Never       Allergies   Allergen Reactions   ? Isometheptene CHEST TIGHTNESS, CHILLS and MUSCLE PAIN   ? Isopto Atropine [Atropine] WHEEZING   ? Sulfa (Sulfonamide Antibiotics) UNKNOWN           REVIEW OF SYSTEMS: 10 point ROS obtained and negative except as above.      PHYSICAL EXAM:  General: Alert, cooperative, no acute distress.  HEENT: Normocephalic, atraumatic.  Neck: Supple.  Lungs: Unlabored respirations, bilateral and equal chest excursion.  Heart: Regular rate.  Skin: Warm and dry to touch.  Abdomen: Nondistended.  MSK: No deformity  Neurological: Alert and oriented x3.        IMPRESSION:    1. Cervical radiculopathy    2. DDD (degenerative disc disease), cervical         PLAN: C7-T1 CESI      Chelsea Primus, MD  Anesthesiology & Interventional Pain Fellow  [phone]

## 2021-05-25 NOTE — Patient Instructions
Procedure Completed Today: Cervical Epidural Steroid Injection    Important information following your procedure today: You may drive today    Pain relief may not be immediate. It is possible you may even experience an increase in pain during the first 24-48 hours followed by a gradual decrease of your pain.  Though the procedure is generally safe and complications are rare, we Rita Gibson ask that you be aware of any of the following:   Any swelling, persistent redness, new bleeding, or drainage from the site of the injection.  You should not experience a severe headache.  You should not run a fever over 101? F.  New onset of sharp, severe back & or neck pain.  New onset of upper or lower extremity numbness or weakness.  New difficulty controlling bowel or bladder function after the injection.  New shortness of breath.    If any of these occur, please call to report this occurrence to a nurse at [phone]. If you are calling after 4:00 p.m., on weekends or holidays please call [phone] and ask to have the resident physician on call for the physician paged or go to your local emergency room.  You may experience soreness at the injection site. Ice can be applied at 20 minute intervals. Avoid application of direct heat, hot showers or hot tubs today.  Avoid strenuous activity today. You may resume your regular activities and exercise tomorrow.  Patients with diabetes may see an elevation in blood sugars for 7-10 days after the injection. It is important to pay close attention to your diet, check your blood sugars daily and report extreme elevations to the physician that treats your diabetes.  Patients taking a daily blood thinner can resume their regular dose this evening.  It is important that you take all medications ordered by your pain physician. Taking medication as ordered is an important part of your pain care plan. If you cannot continue the medication plan, please notify the physician.     Possible side effects to steroids that may occur:  Flushing or redness of the face  Irritability  Fluid retention  Change in women?s menses    The following medications were used: Lidocaine , Triamcinolone  , and Contrast Dye

## 2021-06-04 ENCOUNTER — Ambulatory Visit: Admit: 2021-06-04 | Discharge: 2021-06-04 | Payer: MEDICARE

## 2021-06-04 ENCOUNTER — Encounter: Admit: 2021-06-04 | Discharge: 2021-06-04 | Payer: MEDICARE

## 2021-06-04 DIAGNOSIS — R922 Inconclusive mammogram: Secondary | ICD-10-CM

## 2021-06-04 DIAGNOSIS — Z1231 Encounter for screening mammogram for malignant neoplasm of breast: Secondary | ICD-10-CM

## 2021-07-07 ENCOUNTER — Encounter: Admit: 2021-07-07 | Discharge: 2021-07-07 | Payer: MEDICARE

## 2021-07-13 ENCOUNTER — Encounter: Admit: 2021-07-13 | Discharge: 2021-07-13 | Payer: MEDICARE

## 2021-07-27 ENCOUNTER — Encounter: Admit: 2021-07-27 | Discharge: 2021-07-27 | Payer: MEDICARE

## 2021-07-27 ENCOUNTER — Ambulatory Visit: Admit: 2021-07-27 | Discharge: 2021-07-27 | Payer: MEDICARE

## 2021-07-27 DIAGNOSIS — M5412 Radiculopathy, cervical region: Secondary | ICD-10-CM

## 2021-07-27 DIAGNOSIS — M7918 Myalgia, other site: Secondary | ICD-10-CM

## 2021-07-27 DIAGNOSIS — G43909 Migraine, unspecified, not intractable, without status migrainosus: Secondary | ICD-10-CM

## 2021-07-27 DIAGNOSIS — M5030 Other cervical disc degeneration, unspecified cervical region: Secondary | ICD-10-CM

## 2021-07-27 DIAGNOSIS — M47819 Spondylosis without myelopathy or radiculopathy, site unspecified: Secondary | ICD-10-CM

## 2021-07-27 DIAGNOSIS — R519 Generalized headaches: Secondary | ICD-10-CM

## 2021-07-27 MED ORDER — BUPIVACAINE (PF) 0.25 % (2.5 MG/ML) IJ SOLN
5 mL | Freq: Once | INTRAMUSCULAR | 0 refills | Status: CP | PRN
Start: 2021-07-27 — End: ?
  Administered 2021-07-27: 15:00:00 5 mL via INTRAMUSCULAR

## 2021-07-27 MED ORDER — LIDOCAINE (PF) 20 MG/ML (2 %) IJ SOLN
5 mL | Freq: Once | INTRAMUSCULAR | 0 refills | Status: CP | PRN
Start: 2021-07-27 — End: ?
  Administered 2021-07-27: 15:00:00 5 mL via INTRAMUSCULAR

## 2021-07-27 NOTE — Progress Notes
[facility] - Interventional Pain      Subjective     Chief Complaint: neck/head pain    Interval HPI: Rita Gibson is a 71 y.o. female who  has a past medical history of Generalized headaches and Migraines. who now presents for evaluation.    Patient presents to clinic for follow-up of neck pain.    Patient states reports almost 100% relief of left neck and trapezius pain after TPI performed on 725//2022. Patient states her pain started worsening over the past couple of the months, patient states the relief lasted over 6 months.    Patient reports over 50% after C7-T1 CESI performed on 05/25/2020, patient states her pain is significantly less severe since procedure. The pain relief typically lasts about 3 months, then gradually returns.    Patient states she is still getting improvement of less frequent HAs and eye pain after bilateral C6-T1 RFA performed on 11/24/2020.     The pain is in the low neck region, will radiate to the base of the skull to the to the top to the head to the middle forehead and back of the eyes bilaterally  Will also have pain radiate to the shoulders bilaterally, R>L  Patient reports additional sharp stabbing pain noted just under her eyebrow   Denies BUE pain  +HA - mild pressure noted in the forehead region, intermittent   Pain is constant  Numbness/tingling: none  The pain ranges 3/10   The pain is described as dull, sore, stabbing, sharp, pressure   The pain is exacerbated by household chores, lifting arms above shoulder level  The pain is partially alleviated by rest, massage, heating pad, ice, prescribed medications (gabapentin/Toradol)  +muscle stiffness/tightness  Denies strength loss in BUE/BLE  ?  Patient is no longer taking Lyrica due to lack of benefit and fatigue with higher volumes.     She is also taking Klonopin and Baclofen as prescribed by PCP.  ?  Hx of spine surgery: none  ?  Patient denies fevers, chills, infection, bleeding issues, anticoagulants, new muscle weakness, numbness, tingling, bowel/bladder incontinence, or saddle anesthesia.       ROS:   Review of Systems   Musculoskeletal: Positive for back pain, myalgias and neck pain.   Neurological: Positive for headaches.        PRIOR MEDICATIONS:   Effective  Gabapentin  Baclofen      Ineffective  NSAID    Unable to tolerate  Nortriptyline    Never  Lyrica  Cymbalta  Tizanidine      PRIOR INTERVENTIONS:  No spine surgery   Effective  CESI  TPI  Supraorbital nerve blocks  Bilateral C6-T1 RFA    Ineffective  Physician-directed PT  Exercise        Rita Gibson denies any recent fevers, chills, infection, antibiotics, bowel or bladder incontinence, saddle anesthesia, bleeding issues, or recent anticoagulant.       Past Medical History:  Medical History:   Diagnosis Date   ? Generalized headaches    ? Migraines        Family History:  Family History   Problem Relation Age of Onset   ? Arthritis Mother    ? Cataract Mother    ? Glaucoma Mother    ? Cancer Father    ? Amblyopia Neg Hx    ? Blindness Neg Hx    ? Strabismus Neg Hx    ? Retinal Detachment Neg Hx    ? Macular  Degen Neg Hx        Social History:  [place]   Social History     Socioeconomic History   ? Marital status: Married   ? Highest education level: Some college, no degree   Tobacco Use   ? Smoking status: Never   ? Smokeless tobacco: Never   Vaping Use   ? Vaping Use: Never used   Substance and Sexual Activity   ? Alcohol use: Not Currently   ? Drug use: Never       Allergies:  Allergies   Allergen Reactions   ? Isometheptene CHEST TIGHTNESS, CHILLS and MUSCLE PAIN   ? Isopto Atropine [Atropine] WHEEZING   ? Sulfa (Sulfonamide Antibiotics) UNKNOWN       Medications:  Current Outpatient Medications on File Prior to Visit   Medication Sig Dispense Refill   ? alendronate (FOSAMAX) 70 mg tablet      ? baclofen (LIORESAL) 10 mg tablet Take one tablet by mouth twice daily.     ? buPROPion SR(+) (WELLBUTRIN-SR) 150 mg tablet Take one tablet by mouth twice daily.     ? calcium carbonate (CALCIUM 500 PO) Take  by mouth.     ? cholecalciferol (VITAMIN D-3) 1,000 units tablet Take one tablet by mouth daily.     ? clonazePAM (KLONOPIN) 1 mg tablet Take one-half tablet by mouth at bedtime as needed.     ? KETOROLAC TROMETHAMINE (KETOROLAC IJ) Inject  to area(s) as directed.     ? latanoprost (XALATAN) 0.005 % ophthalmic solution Apply one drop to both eyes at bedtime daily. 7.5 mL 3   ? methylPREDNIsolone (MEDROL (PAK)) 4 mg tablet Take medication as directed on package for 6 days. Take with food. 21 tablet 0   ? omeprazole DR(+) (PRILOSEC) 40 mg capsule Take one capsule by mouth daily before breakfast.     ? rimegepant (NURTEC ODT) 75 mg rapid dissolve tablet Dissolve one tablet by mouth. Place on or under the tongue. Max 75 mg in 24 hours, and 18 doses in 30 days.     ? rizatriptan (MAXALT-MLT) 10 mg rapid dissolve tablet      ? rosuvastatin (CRESTOR) 5 mg tablet Take one tablet by mouth at bedtime daily.       No current facility-administered medications on file prior to visit.       Physical examination:   BP (!) (P) 140/69  - Pulse (P) 65  - Ht 162.6 cm (5' 4)  - Wt 47.2 kg (104 lb)  - SpO2 (P) 100%  - BMI 17.85 kg/m?   Pain Score: Three    Female Opioid Risk Score: 0 (08/09/2018  8:42 AM)  Opioid Risk Category: Low Risk (08/09/2018  8:42 AM)      General: The patient is a well-developed, well nourished 71 y.o. female in no acute distress.   HEENT: Head is normocephalic and atraumatic.  Pulmonary: The patient has unlabored respirations and bilateral symmetric chest excursion. Mild TTP to right supraorbital region.   Abdomen: Soft, nontender, and nondistended. There is no rebound or guarding per patient assessment.  Extremities: No clubbing, cyanosis, or edema.  ?  Neurologic:   The patient is alert and oriented times 3.    There is no sensory deficit to light touch or pinprick in the affected areas. There is no allodynia noted.  ?  +mild- pain with palpation at the base of the occiput bilaterally, L>R, no radiating pain noted  ?  Moderate TTP to upper trapezius and levator scapulae with increased muscle tone bilaterally with trigger points and referred pain, R>L.   ?  Musculoskeletal:   Gait is normal.  ?  C-Spine    There is mild-moderate R>L TTP to cervical paraspinal tenderness. Paraspinal muscle tone is increased with trigger points noted and referred pain with palpation.  ROM with flexion, extension, rotation, and lateral bending is intact but stiff mostly noted with extension.   Strength is equal and adequate bilaterally in the flexors and extensors of the bilateral upper extremities.    Hoffmann negative bilaterally.     MRI C-Spine Results:  11/2015  C2-3 mild disc desiccation  C3-4 mod loss of disc height  C4-5 mild disc dessication. Left paracentral disc protrusion. mild R FA.   C5-6 +FA. Broad DOC. Left foraminal and paracentral disc protrusion. Miild-mod L NFS. Mild R NFS. Some effacement of thecal sac.  C6-7 DDD.   Spondylosis most pronounced at C5-6.       Last Cr and LFT's:  Creatinine   Date Value Ref Range Status   03/02/2020 0.87 0.4 - 1.00 MG/DL Final     AST (SGOT)   Date Value Ref Range Status   03/01/2020 24 7 - 40 U/L Final     ALT (SGPT)   Date Value Ref Range Status   03/01/2020 19 7 - 56 U/L Final     Alk Phosphatase   Date Value Ref Range Status   03/01/2020 63 25 - 110 U/L Final     Total Bilirubin   Date Value Ref Range Status   03/01/2020 0.7 0.3 - 1.2 MG/DL Final          Assessment:    Rita Gibson is a 71 y.o. female who  has a past medical history of Generalized headaches and Migraines. who presents for evaluation of neck pain.     The pain complaints are most likely due to:    1. Myalgia, other site  [place] AMB SPINE TRIGGER POINT INJECTION    [place] AMB SPINE TRIGGER POINT INJECTION      2. Cervical radiculopathy  [place] AMB SPINE TRIGGER POINT INJECTION      3. DDD (degenerative disc disease), cervical        4. Facet arthropathy            Patient has had an adequate trial of > 3 months of rest, exercise, NSAID's, multimodal treatment, and the passage of time without improvement of symptoms. The pain has significant impact on the daily quality of life.     Plan:    1. Discussed plan of care options with patient. Will perform cervicothoracic TPI today.   2. This patient had at least 50% pain relief for at least 6 months with the last epidural injection. Okay to repeat C7-T1 CESI in 1 month.    3. Will d/c Lyrica.    4. Okay to repeat bilateral C6-T1 RFA after 05/26/2021, if patient HAs worsen.   5. Continue Baclofen as prescribed by PCP.  6. May still consider supraorbital nerve blocks in future, L>R.  7. Follow-up in 3 months.    Risks/benefits of all pharmacologic and interventional treatments discussed and questions answered.

## 2021-07-27 NOTE — Procedures
[place] [facility] SPINE TRIGGER POINT INJECTION  Locations: L upper trapezius, R upper trapezius, L levator scapulae, R levator scapulae, L thoracic, R thoracic, L cervical and R cervical  Consent:   Consent obtained: written and verbal  Consent given by: patient  Alternatives discussed: alternative treatment  Discussed with patient the purpose of the treatment/procedure, other ways of treating my condition, including no treatment/ procedure and the risks and benefits of the alternatives. Patient has decided to proceed with treatment/procedure.        Universal Protocol:  Relevant documents: relevant documents present and verified  Test results: test results available and properly labeled  Imaging studies: imaging studies not available  Required items: required blood products, implants, devices, and special equipment not available  Site marked: the operative site was not marked  Patient identity confirmed: Patient identify confirmed verbally with patient.        Time out: Immediately prior to procedure a "time out" was called to verify the correct patient, procedure, equipment, support staff and site/side marked as required      Procedures Details:   Indications: cervicalgia and myalgiaPrep: alcohol  Needle size: 27 G  Number of muscles: 3 or more  Approach: posterior  Medications administered: 5 mL bupivacaine PF 0.25 %; 5 mL lidocaine (PF) 20 mg/mL (2 %)  Patient tolerance: Patient tolerated the procedure well with no immediate complications. Pressure was applied, and hemostasis was accomplished.

## 2021-08-03 ENCOUNTER — Encounter: Admit: 2021-08-03 | Discharge: 2021-08-03 | Payer: MEDICARE

## 2021-09-01 IMAGING — MR KNEERTWO
5 of 9 series · 21 of 40 positions shown · non-contrast
Comparison: none

[Series 8: T2 fat-sat · axial · right · 4.0mm · 0.40mm/px · z∈[-52,+57]mm · 4 of 26 slices shown (1 of 3)]
[im 1/26]
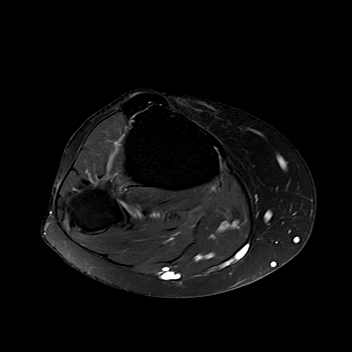
[im 9/26]
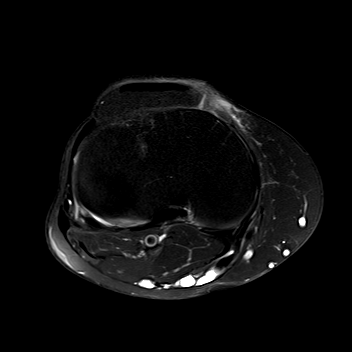
[im 17/26]
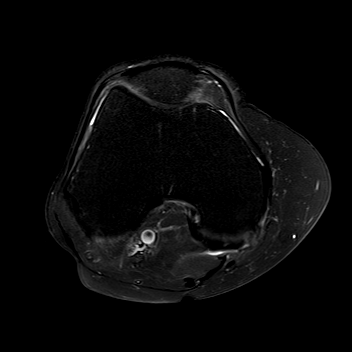
[im 26/26]
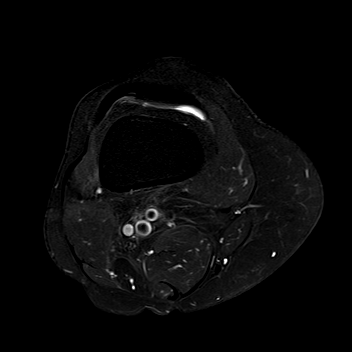

[Series 9: T1 · axial · right · 4.0mm · 0.44mm/px · z∈[-52,+57]mm · 4 of 26 slices shown]
[im 1/26]
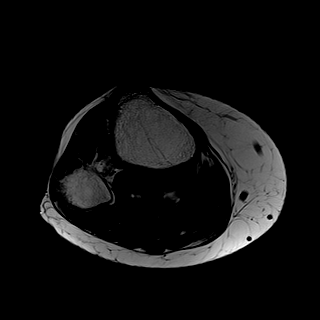
[im 9/26]
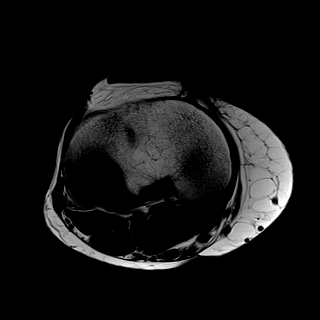
[im 17/26]
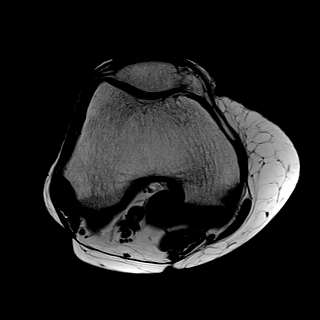
[im 26/26]
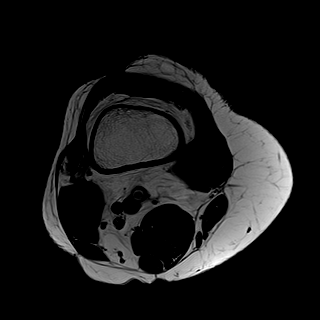

[Series 10: T2 fat-sat · coronal · right · 4.0mm · 0.46mm/px · 5 of 30 slices shown (2 of 3)]
[im 1/30]
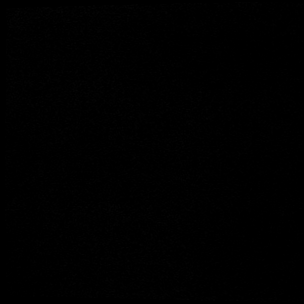
[im 8/30]
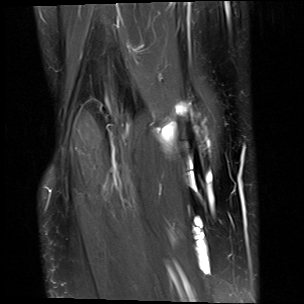
[im 15/30]
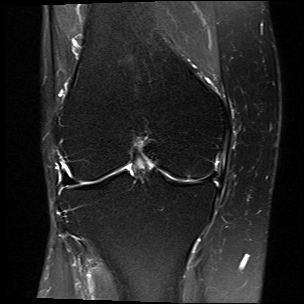
[im 22/30]
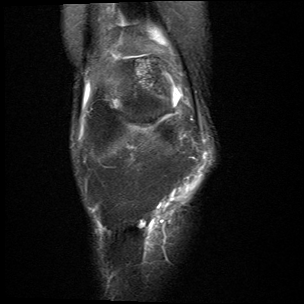
[im 30/30]
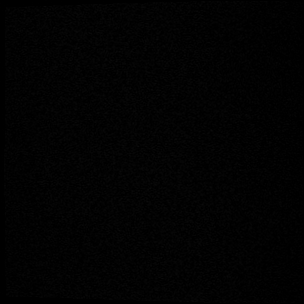

[Series 11: T2 fat-sat · sagittal · right · 4.0mm · 0.56mm/px · 4 of 28 slices shown (3 of 3)]
[im 1/28]
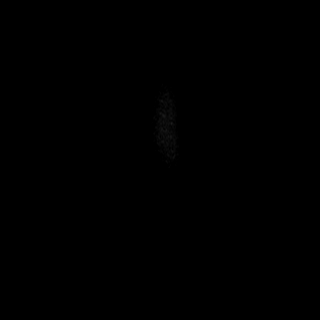
[im 10/28]
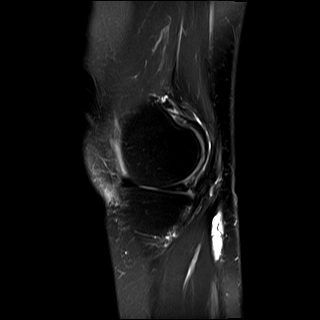
[im 19/28]
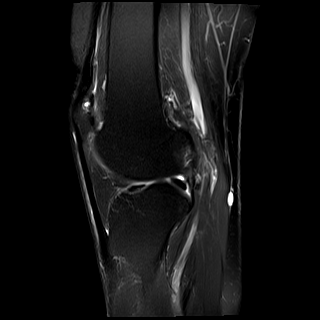
[im 28/28]
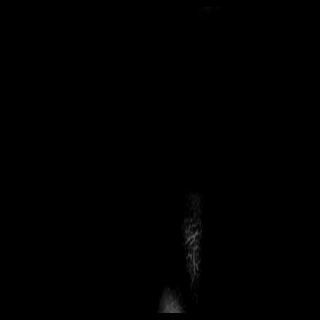

[Series 12: PD · sagittal · right · 4.0mm · 0.42mm/px · 4 of 28 slices shown]
[im 1/28]
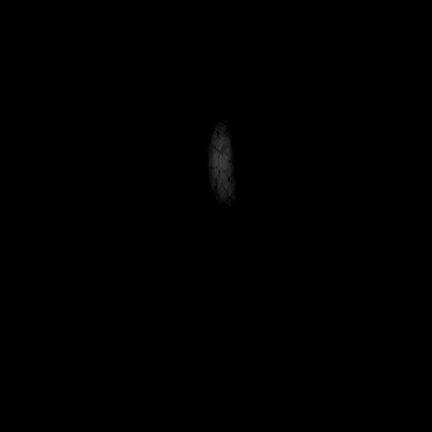
[im 10/28]
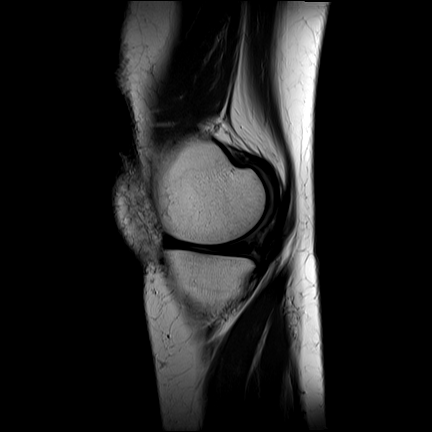
[im 19/28]
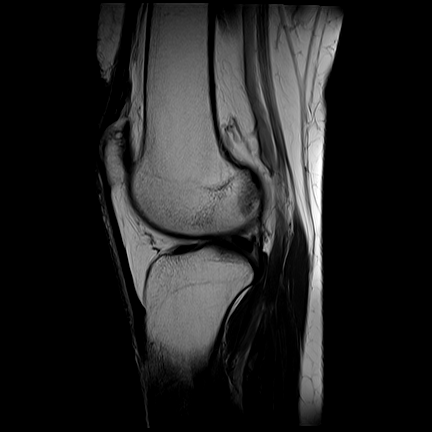
[im 28/28]
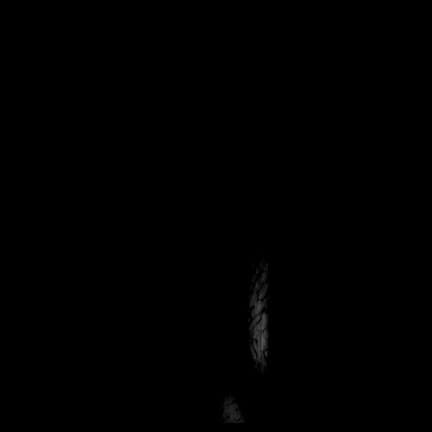

[21 of 40 positions shown; findings below may reference images not displayed]

DIAGNOSTIC STUDIES

EXAM

MRI of the right knee without contrast

INDICATION

right knee pain
RT LATERAL KNEE PAIN WITH SWELLING.  RG

TECHNIQUE

Sagittal axial coronal images were obtained with variable T1 and T2 weighting.

COMPARISONS

None available

FINDINGS

Quadriceps and patellar tendons are within normal limits. There is mild to moderate chondromalacia
patella. Small knee effusion is noted. Small popliteal cyst is seen measuring 6 cm craniocaudad.

The anterior and posterior cruciate ligaments are normal.

The medial and lateral collateral ligaments are normal.

The lateral meniscus is unremarkable.

There is a small tear of the inferior articular surface of the posterior horn medial meniscus image
9 series 11.

IMPRESSION

Small tear involving the inferior articular surface of the posterior horn of the medial meniscus.

Mild to moderate chondromalacia patella and small knee effusion. Small popliteal cyst is evident.

Tech Notes:

RT LATERAL KNEE PAIN WITH SWELLING.  RG

## 2021-09-03 ENCOUNTER — Encounter: Admit: 2021-09-03 | Discharge: 2021-09-03 | Payer: MEDICARE

## 2021-09-07 ENCOUNTER — Encounter: Admit: 2021-09-07 | Discharge: 2021-09-07 | Payer: MEDICARE

## 2021-09-07 ENCOUNTER — Ambulatory Visit: Admit: 2021-09-07 | Discharge: 2021-09-07 | Payer: MEDICARE

## 2021-09-07 DIAGNOSIS — M5412 Radiculopathy, cervical region: Secondary | ICD-10-CM

## 2021-09-07 DIAGNOSIS — M5030 Other cervical disc degeneration, unspecified cervical region: Secondary | ICD-10-CM

## 2021-09-07 MED ORDER — IOHEXOL 240 MG IODINE/ML IV SOLN
1 mL | Freq: Once | EPIDURAL | 0 refills | Status: CP
Start: 2021-09-07 — End: ?
  Administered 2021-09-07: 18:00:00 1 mL via EPIDURAL

## 2021-09-07 MED ORDER — TRIAMCINOLONE ACETONIDE 40 MG/ML IJ SUSP
80 mg | Freq: Once | EPIDURAL | 0 refills | Status: CP
Start: 2021-09-07 — End: ?
  Administered 2021-09-07: 18:00:00 80 mg via EPIDURAL

## 2021-09-07 NOTE — Progress Notes
[facility]  INTERVENTIONAL PAIN PROCEDURE HISTORY AND PHYSICAL    Chief Complaint:   1. Cervical radiculopathy    2. DDD (degenerative disc disease), cervical        HISTORY OF PRESENT ILLNESS:    Patient returns today for interventional treatment of pain secondary to the above chief complaint.   Patient denies any recent fevers, chills, infection, antibiotics, coagulopathy or contra-indicated anticoagulants.   Risks of the procedure were discussed including but not limited to:   Post procedure pain, infection, bleeding, damage to surrounding structures (including muscles, blood vessels, nerve roots, spinal cord) and reaction to medications.   Patient reports understanding and has elected to proceed with the procedure.          Oswestry Total Score:: 18       Medical History:   Diagnosis Date   ? Generalized headaches    ? Migraines        Surgical History:   Procedure Laterality Date   ? ANKLE SURGERY     ? HEART CATHETERIZATION     ? TUBAL LIGATION         family history includes Arthritis in her mother; Cancer in her father; Cataract in her mother; Glaucoma in her mother.    Social History     Socioeconomic History   ? Marital status: Married   ? Highest education level: Some college, no degree   Tobacco Use   ? Smoking status: Never   ? Smokeless tobacco: Never   Vaping Use   ? Vaping Use: Never used   Substance and Sexual Activity   ? Alcohol use: Not Currently   ? Drug use: Never       Allergies   Allergen Reactions   ? Isometheptene CHEST TIGHTNESS, CHILLS and MUSCLE PAIN   ? Isopto Atropine [Atropine] WHEEZING   ? Sulfa (Sulfonamide Antibiotics) UNKNOWN         REVIEW OF SYSTEMS: 10 point ROS obtained and negative except as above.      PHYSICAL EXAM:  General: Alert, cooperative, no acute distress.  HEENT: Normocephalic, atraumatic.  Neck: Supple.  Chest: Unlabored respirations, bilateral and equal chest excursion.  Skin: Dry.  Abdomen: Nondistended.  MSK: Functional movement of all extremities. Neurological: Alert and oriented x3.  Speech is clear. No tremors.       IMPRESSION:    1. Cervical radiculopathy    2. DDD (degenerative disc disease), cervical        PLAN: CESI C7-T1       Chart reviewed.   Patient expressed understanding and consent to proceed.   Written consent signed.   Case discussed with attending physician.                Gareth Morgan, M.D.   Fellow; Interventional Pain Medicine  Department of Anesthesiology and Pain Medicine   09/07/2021   1:03 PM

## 2021-09-07 NOTE — Progress Notes

## 2021-09-07 NOTE — Procedures
Attending Surgeon: Evelina Bucy, MD    Anesthesia: Local    Pre-Procedure Diagnosis:   1. Cervical radiculopathy    2. DDD (degenerative disc disease), cervical        Post-Procedure Diagnosis:   1. Cervical radiculopathy    2. DDD (degenerative disc disease), cervical        Pain Score: Five    [place] AMB SPINE INJECT INTERLAM CRV/THRC  Procedure: epidural - interlaminar    Laterality: n/a   on 09/07/2021 1:15 PM  Location: cervical ESI with imaging -  C7-T1      Consent:   Consent obtained: written  Consent given by: patient  Risks discussed: allergic reaction, bleeding, bruising, infection, never damage, no change or worsening in pain, pneumo thorax, reaction to medication, seizure, swelling and weakness  Alternatives discussed: alternative treatment, delayed treatment and no treatment  Discussed with patient the purpose of the treatment/procedure, other ways of treating my condition, including no treatment/ procedure and the risks and benefits of the alternatives. Patient has decided to proceed with treatment/procedure.        Universal Protocol:  Relevant documents: relevant documents present and verified  Site marked: the operative site was marked  Patient identity confirmed: Patient identify confirmed verbally with patient.        Time out: Immediately prior to procedure a time out was called to verify the correct patient, procedure, equipment, support staff and site/side marked as required      Procedures Details:   Indications: pain   Prep: chlorhexidine  Patient position: prone  Estimated Blood Loss: minimal  Specimens: none  Number of Levels: 1  Approach: midline  Guidance: fluoroscopy  Contrast: Procedure confirmed with contrast under live fluoroscopy.  Needle and Epidural Catheter: tuohy  Needle size: 18 G  Injection procedure: Incremental injection and Negative aspiration for blood  Amount Injected:   C7-T1: 4mL  Patient tolerance: Patient tolerated the procedure well with no immediate complications. Pressure was applied, and hemostasis was accomplished.  Outcome: Pain unchanged  Comments:   CERVICAL INTERLAMINAR EPIDURAL STEROID INJECTION    PROCEDURE:  1) C7-T1 interlaminar epidural steroid injection  2) Fluoroscopic needle guidance    REASON FOR PROCEDURE: Cervical radiculopathy, DDD (cervical)    ATTENDING PHYSICIAN: Evelina Bucy, MD    MEDICATIONS INJECTED: 2 mL of triamciniolone (80 mg) and 2 mL of sterile, preservative-free normal saline    LOCAL ANESTHETIC INJECTED: 1 mL of 1% lidocaine    SEDATION MEDICATIONS: None    ESTIMATED BLOOD LOSS: None    SPECIMENS REMOVED: None    COMPLICATIONS: None    TECHNIQUE: Time-out was taken to identify the correct patient, procedure and side prior to starting the procedure. With the patient lying in a prone position with the neck in a slightly  flexed position, the area was prepped and draped in sterile fashion using DuraPrep and a fenestrated drape. The area was determined under fluoroscopic guidance. A 27-gauge, 1.25-inch  needle was used to anesthetize the needle entry site and subcutaneous tissues.     The 18-gauge, 3.5-inch Tuohy needle was advanced through the ligamentum flavum using loss of resistance technique. Once the tip of the needle was thought to be in the desired position, contrast was injected to confirm only epidural spread and no vascular runoff via A-P and lateral  views. The injectate was then injected slowly with intermittent negative aspiration.    The procedure was completed without complications and was tolerated well. The patient was monitored after the procedure.  The patient (or responsible party) was given post-procedure and  discharge instructions to follow at home. The patient was discharged in stable condition.    Evelina Bucy, MD, MBA          Administrations This Visit     iohexoL (OMNIPAQUE-240) 240 mg/mL injection 1 mL     Admin Date  09/07/2021 Action  Given Dose  1 mL Route  Epidural Administered By  Evelina Bucy, MD triamcinolone acetonide (KENALOG-40) injection 80 mg     Admin Date  09/07/2021 Action  Given Dose  80 mg Route  Epidural Administered By  Evelina Bucy, MD              Estimated blood loss: none or minimal  Specimens: none  Patient tolerated the procedure well with no immediate complications. Pressure was applied, and hemostasis was accomplished.

## 2021-09-14 IMAGING — CR [ID]
3 series · 3 of 3 positions shown · non-contrast
Comparison: none

[w knee ap right]
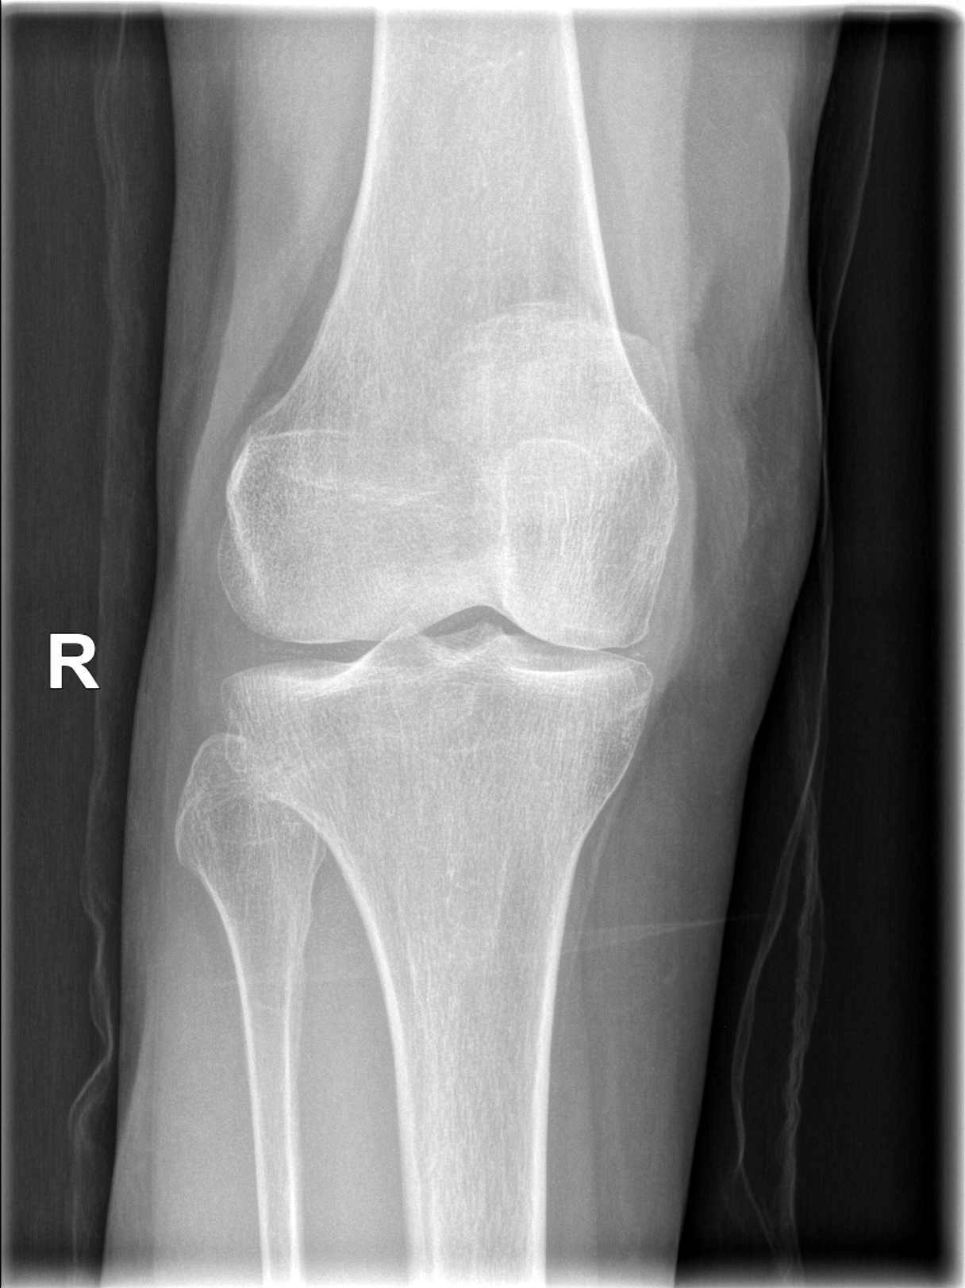

[w knee lat right]
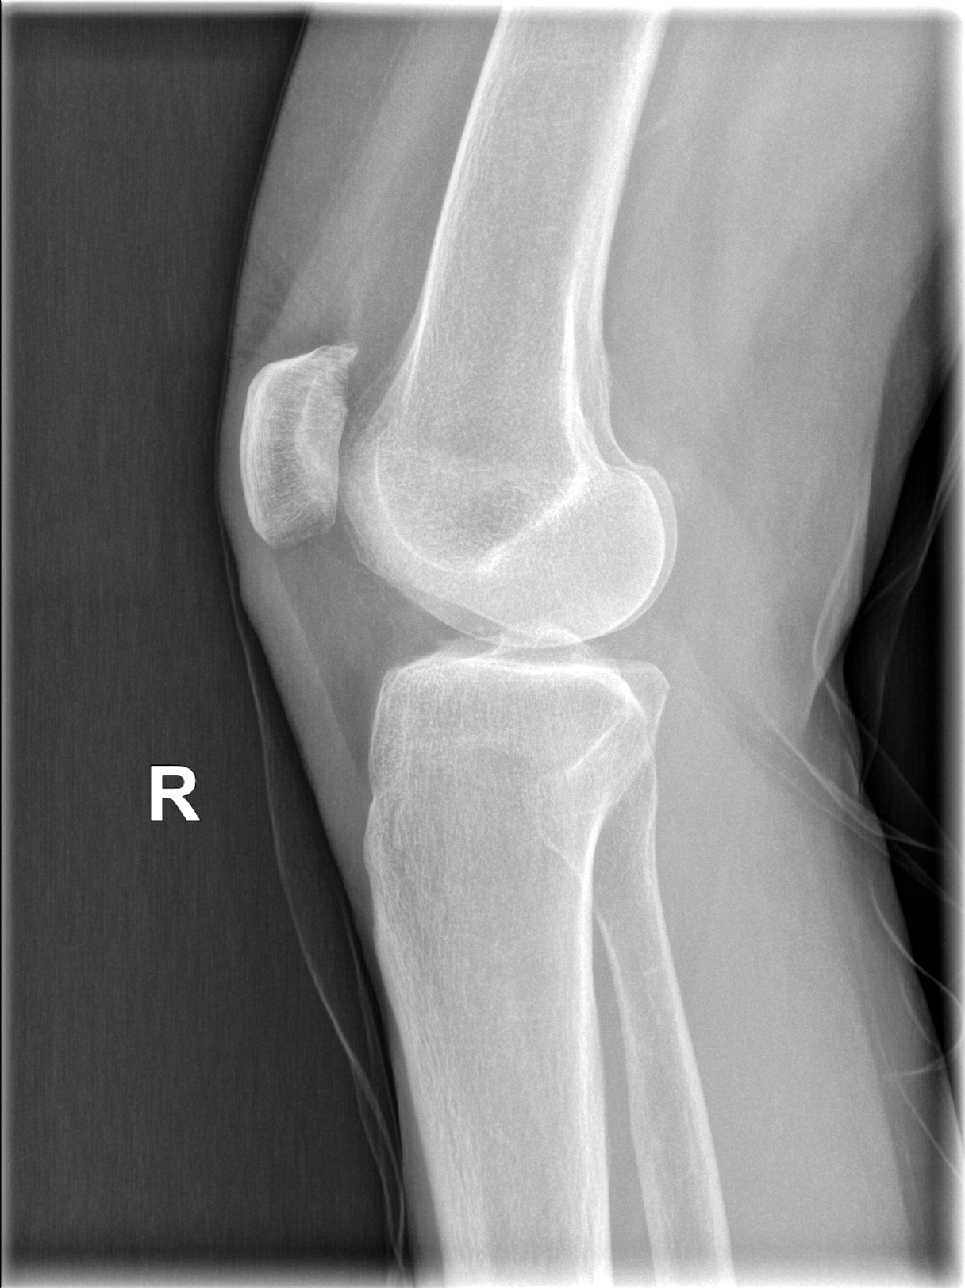

[x knee sunrise right]
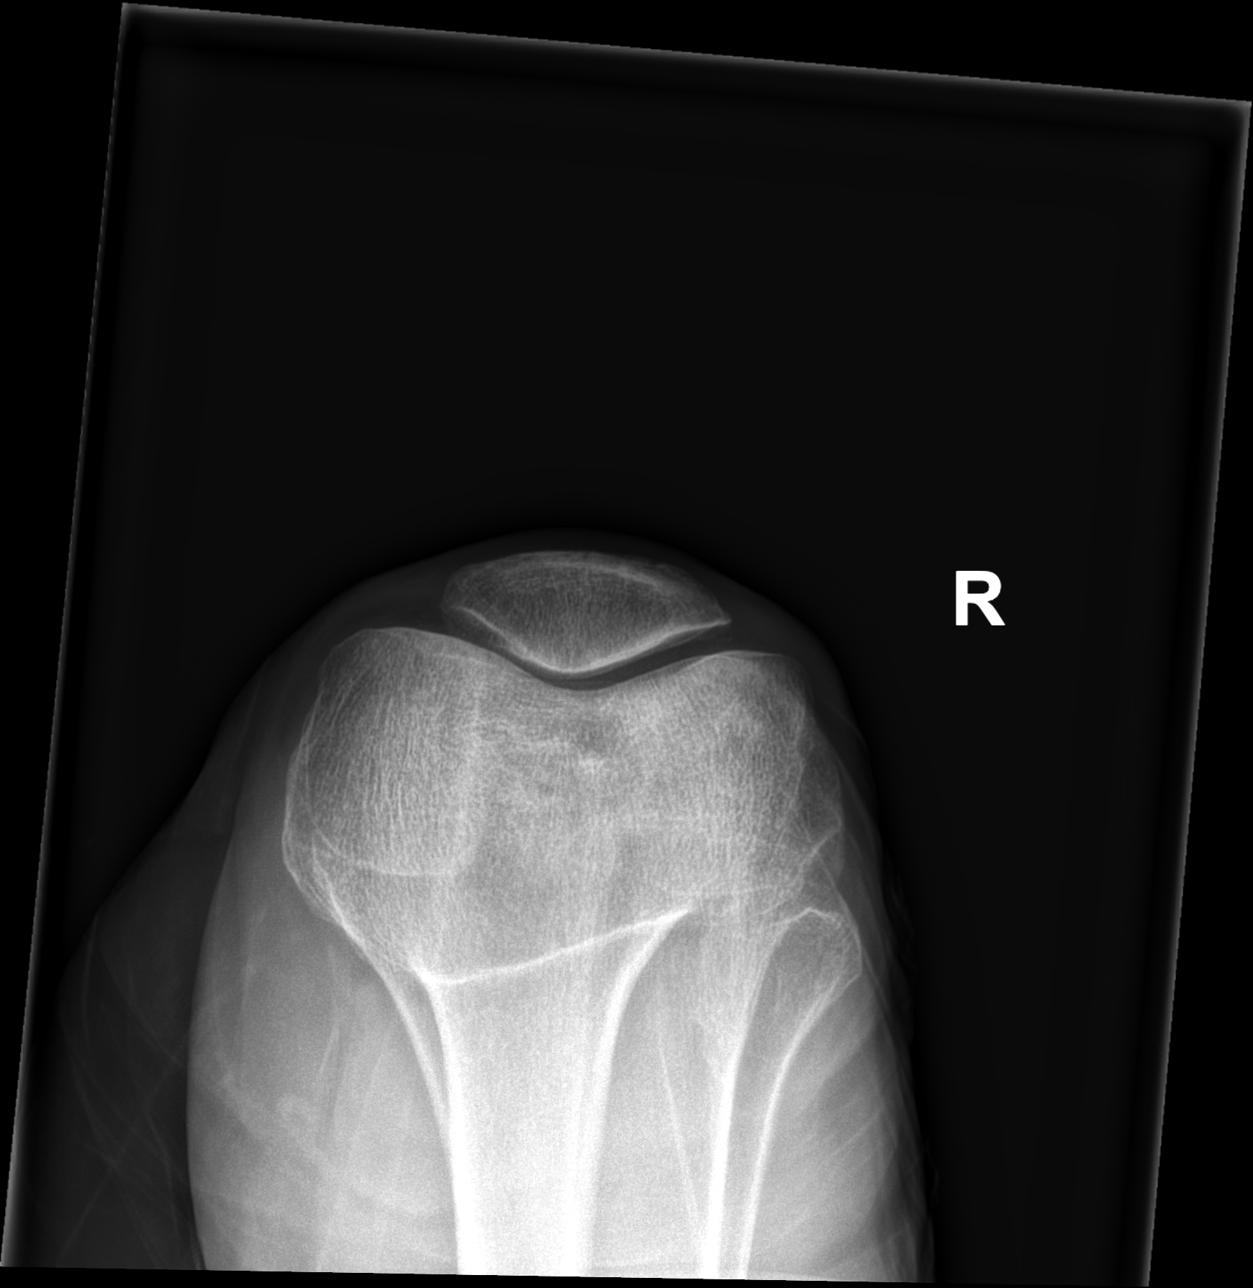

[3 of 3 positions shown; findings below may reference images not displayed]

DIAGNOSTIC STUDIES

EXAM

RIGHT KNEE

INDICATION

Right knee meniscal tear
reason for the exam: knee pain

TECHNIQUE

AP standing, lateral, and patellar view of knee

COMPARISONS

None

FINDINGS

No dislocation nor acute bony injury/ is identified. Tricompartment degenerative changes are present
with sclerosis, joint space narrowing, and spurring. Bony structures are otherwise unremarkable.
Chondrocalcinosis is noted. No knee joint effusion is identified

IMPRESSION

No acute bony injury of the  right knee.   Degenerative changes and chondrocalcinosis.

Tech Notes:

reason for the exam: knee pain

## 2021-10-05 ENCOUNTER — Encounter: Admit: 2021-10-05 | Discharge: 2021-10-05 | Payer: MEDICARE

## 2021-10-05 ENCOUNTER — Ambulatory Visit: Admit: 2021-10-05 | Discharge: 2021-10-06 | Payer: MEDICARE

## 2021-10-05 DIAGNOSIS — M5030 Other cervical disc degeneration, unspecified cervical region: Secondary | ICD-10-CM

## 2021-10-05 DIAGNOSIS — M47819 Spondylosis without myelopathy or radiculopathy, site unspecified: Secondary | ICD-10-CM

## 2021-10-05 DIAGNOSIS — M7918 Myalgia, other site: Secondary | ICD-10-CM

## 2021-10-05 NOTE — Progress Notes
[facility] - Interventional Pain      Subjective     Chief Complaint: neck/head pain    Interval HPI: Rita Gibson is a 71 y.o. female who  has a past medical history of Generalized headaches and Migraines. who now presents for evaluation.    Patient presents to clinic for follow-up of neck pain.    Patient states reports almost 100% relief of left neck and trapezius pain after TPI performed on 725//2022. Patient states her pain started worsening over the past couple of the months, patient states the relief lasted over 6 months.    Patient reports over 50% after C7-T1 CESI performed on 09/07/2021, patient states her pain is significantly less severe since procedure. The pain relief typically lasts about 3 months, then gradually returns.    Patient states she is still getting improvement of less frequent HAs and eye pain after bilateral C6-T1 RFA performed on 11/24/2020.     The pain is in the low neck region, will radiate to the base of the skull to the to the top to the head to the middle forehead and back of the eyes bilaterally  Will also have pain radiate to the shoulders bilaterally, R>L  Patient reports additional sharp stabbing pain noted just under her eyebrow   Denies BUE pain  +HA - mild pressure noted in the forehead region, intermittent, few times a month (well controlled)   Pain is constant  Numbness/tingling: none  The pain ranges 0-3/10   The pain is described as dull, sore, stabbing, sharp, pressure   The pain is exacerbated by household chores, lifting arms above shoulder level  The pain is partially alleviated by rest, massage, heating pad, ice, prescribed medications (gabapentin/Toradol)  +muscle stiffness/tightness  Denies strength loss in BUE/BLE  ?  Patient is no longer taking Lyrica due to lack of benefit and fatigue with higher volumes.     She is also taking Klonopin and Baclofen as prescribed by PCP.  ?  Hx of spine surgery: none  ?  Patient denies fevers, chills, infection, bleeding issues, anticoagulants, new muscle weakness, numbness, tingling, bowel/bladder incontinence, or saddle anesthesia.       ROS:   Review of Systems   Musculoskeletal: Positive for back pain, myalgias and neck pain.   Neurological: Positive for headaches.        PRIOR MEDICATIONS:   Effective  Gabapentin  Baclofen      Ineffective  NSAID    Unable to tolerate  Nortriptyline    Never  Lyrica  Cymbalta  Tizanidine      PRIOR INTERVENTIONS:  No spine surgery   Effective  CESI  TPI  Supraorbital nerve blocks  Bilateral C6-T1 RFA    Ineffective  Physician-directed PT  Exercise        Berna Spare denies any recent fevers, chills, infection, antibiotics, bowel or bladder incontinence, saddle anesthesia, bleeding issues, or recent anticoagulant.       Past Medical History:  Medical History:   Diagnosis Date   ? Generalized headaches    ? Migraines        Family History:  Family History   Problem Relation Age of Onset   ? Arthritis Mother    ? Cataract Mother    ? Glaucoma Mother    ? Cancer Father    ? Amblyopia Neg Hx    ? Blindness Neg Hx    ? Strabismus Neg Hx    ? Retinal Detachment Neg  Hx    ? Macular Degen Neg Hx        [place]   Social History     Socioeconomic History   ? Marital status: Married   ? Highest education level: Some college, no degree   Tobacco Use   ? Smoking status: Never   ? Smokeless tobacco: Never   Vaping Use   ? Vaping Use: Never used   Substance and Sexual Activity   ? Alcohol use: Not Currently   ? Drug use: Never       Allergies:  Allergies   Allergen Reactions   ? Isometheptene CHEST TIGHTNESS, CHILLS and MUSCLE PAIN   ? Isopto Atropine [Atropine] WHEEZING   ? Sulfa (Sulfonamide Antibiotics) UNKNOWN       Medications:  Current Outpatient Medications on File Prior to Visit   Medication Sig Dispense Refill   ? alendronate (FOSAMAX) 70 mg tablet      ? baclofen (LIORESAL) 10 mg tablet Take one tablet by mouth twice daily.     ? buPROPion SR(+) (WELLBUTRIN-SR) 150 mg tablet Take one tablet by mouth twice daily.     ? calcium carbonate (CALCIUM 500 PO) Take  by mouth.     ? cholecalciferol (VITAMIN D-3) 1,000 units tablet Take one tablet by mouth daily.     ? clonazePAM (KLONOPIN) 1 mg tablet Take one-half tablet by mouth at bedtime as needed.     ? KETOROLAC TROMETHAMINE (KETOROLAC IJ) Inject  to area(s) as directed.     ? latanoprost (XALATAN) 0.005 % ophthalmic solution Apply one drop to both eyes at bedtime daily. 7.5 mL 3   ? methylPREDNIsolone (MEDROL (PAK)) 4 mg tablet Take medication as directed on package for 6 days. Take with food. 21 tablet 0   ? omeprazole DR(+) (PRILOSEC) 40 mg capsule Take one capsule by mouth daily before breakfast.     ? rimegepant (NURTEC ODT) 75 mg rapid dissolve tablet Dissolve one tablet by mouth. Place on or under the tongue. Max 75 mg in 24 hours, and 18 doses in 30 days.     ? rizatriptan (MAXALT-MLT) 10 mg rapid dissolve tablet      ? rosuvastatin (CRESTOR) 5 mg tablet Take one tablet by mouth at bedtime daily.       No current facility-administered medications on file prior to visit.       Physical examination:   Ht 162.6 cm (5' 4)  - Wt 47.2 kg (104 lb)  - BMI 17.85 kg/m?   Pain Score: Zero    Female Opioid Risk Score: 0 (08/09/2018  8:42 AM)  Opioid Risk Category: Low Risk (08/09/2018  8:42 AM)      Exam performed by instructing patient to perform various maneuvers and provide feedback while I personally visualized the patient exam.   ?  General: The patient is a well-developed, well nourished 71 y.o. female in no acute distress.   HEENT: Head is normocephalic and atraumatic.  Pulmonary: The patient has unlabored respirations and bilateral symmetric chest excursion. Mild TTP to right supraorbital region.   Abdomen: Soft, nontender, and nondistended. There is no rebound or guarding per patient assessment.  Extremities: No clubbing, cyanosis, or edema as seen on video.   ?  Neurologic:   The patient is alert and oriented times 3.    ?  +minimal pain with palpation at the base of the occiput bilaterally, L>R, no radiating pain noted  ?  Mild TTP  to upper trapezius and levator scapulae with increased muscle tone bilaterally with trigger points and referred pain, L>R.   ?  Musculoskeletal:     ?  C-Spine    There is mild TTP to cervical paraspinal tenderness. Paraspinal muscle tone is increased with trigger points noted and referred pain with palpation.  ROM with flexion, extension, rotation, and lateral bending is intact but stiff mostly noted with extension.   Patient believes strength is equal and adequate bilaterally in the flexors and extensors of the bilateral upper extremities.    ?    MRI C-Spine Results:  11/2015  C2-3 mild disc desiccation  C3-4 mod loss of disc height  C4-5 mild disc dessication. Left paracentral disc protrusion. mild R FA.   C5-6 +FA. Broad DOC. Left foraminal and paracentral disc protrusion. Miild-mod L NFS. Mild R NFS. Some effacement of thecal sac.  C6-7 DDD.   Spondylosis most pronounced at C5-6.       Last Cr and LFT's:  Creatinine   Date Value Ref Range Status   03/02/2020 0.87 0.4 - 1.00 MG/DL Final     AST (SGOT)   Date Value Ref Range Status   03/01/2020 24 7 - 40 U/L Final     ALT (SGPT)   Date Value Ref Range Status   03/01/2020 19 7 - 56 U/L Final     Alk Phosphatase   Date Value Ref Range Status   03/01/2020 63 25 - 110 U/L Final     Total Bilirubin   Date Value Ref Range Status   03/01/2020 0.7 0.3 - 1.2 MG/DL Final          Assessment:    Rita Gibson is a 71 y.o. female who  has a past medical history of Generalized headaches and Migraines. who presents for evaluation of neck pain.     The pain complaints are most likely due to:    1. Cervical radiculopathy        2. DDD (degenerative disc disease), cervical        3. Facet arthropathy        4. Myalgia, other site            Patient has had an adequate trial of > 3 months of rest, exercise, NSAID's, multimodal treatment, and the passage of time without improvement of symptoms. The pain has significant impact on the daily quality of life.     Plan:    1. Discussed plan of care options with patient. This patient had at least 50% pain relief for at least 6 months with the last epidural injection. Will schedule to repeat C7-T1 CESI at next available appointment.   2. Will continue with cervicothoracic TPI as scheduled.  3. Okay to repeat bilateral C6-T1 RFA after 05/26/2021, if patient HAs worsen.   4. Continue Baclofen as prescribed by PCP.  5. May still consider supraorbital nerve blocks in future, L>R.  6. Follow-up in 3 months.    Risks/benefits of all pharmacologic and interventional treatments discussed and questions answered.     Todays visit took place via face-to-face encounter utilizing Zoom application. Visit Start Time 0940 Visit End Time 0951.

## 2021-10-06 DIAGNOSIS — M5412 Radiculopathy, cervical region: Secondary | ICD-10-CM

## 2021-10-30 ENCOUNTER — Encounter: Admit: 2021-10-30 | Discharge: 2021-10-30 | Payer: MEDICARE

## 2021-10-30 ENCOUNTER — Ambulatory Visit: Admit: 2021-10-30 | Discharge: 2021-10-30 | Payer: MEDICARE

## 2021-10-30 DIAGNOSIS — M7918 Myalgia, other site: Secondary | ICD-10-CM

## 2021-10-30 DIAGNOSIS — R519 Generalized headaches: Secondary | ICD-10-CM

## 2021-10-30 DIAGNOSIS — G43909 Migraine, unspecified, not intractable, without status migrainosus: Secondary | ICD-10-CM

## 2021-10-30 DIAGNOSIS — H409 Unspecified glaucoma: Secondary | ICD-10-CM

## 2021-10-30 MED ORDER — LIDOCAINE (PF) 20 MG/ML (2 %) IJ SOLN
5 mL | Freq: Once | INTRAMUSCULAR | 0 refills | Status: CP | PRN
Start: 2021-10-30 — End: ?
  Administered 2021-10-30: 13:00:00 5 mL via INTRAMUSCULAR

## 2021-10-30 MED ORDER — BUPIVACAINE (PF) 0.25 % (2.5 MG/ML) IJ SOLN
5 mL | Freq: Once | INTRAMUSCULAR | 0 refills | Status: CP | PRN
Start: 2021-10-30 — End: ?
  Administered 2021-10-30: 13:00:00 5 mL via INTRAMUSCULAR

## 2021-10-30 NOTE — Procedures
Attending Surgeon: Evelina Bucy, MD    Anesthesia: Local    Pre-Procedure Diagnosis:   1. Myalgia, other site        Post-Procedure Diagnosis:   1. Myalgia, other site        Pain Score: Two    Trigger Point  Locations: L upper trapezius, R upper trapezius, L levator scapulae, R levator scapulae, L thoracic and R thoracic    Consent:   Consent obtained: written  Consent given by: patient  Alternatives discussed: alternative treatment, delayed treatment and no treatment  Discussed with patient the purpose of the treatment/procedure, other ways of treating my condition, including no treatment/ procedure and the risks and benefits of the alternatives. Patient has decided to proceed with treatment/procedure.        Universal Protocol:  Relevant documents: relevant documents present and verified  Site marked: the operative site was marked  Patient identity confirmed: Patient identify confirmed verbally with patient.        Time out: Immediately prior to procedure a time out was called to verify the correct patient, procedure, equipment, support staff and site/side marked as required      Procedures Details:   Indications: muscle spasm, myalgia and pain  Prep: alcohol  Needle size: 27 G  Number of muscles: 3 or more    Approach: posterior  Medications administered: 5 mL bupivacaine PF 0.25 %; 5 mL lidocaine (PF) 20 mg/mL (2 %)  Patient tolerance: Patient tolerated the procedure well with no immediate complications. Pressure was applied, and hemostasis was accomplished.  Comments:   PROCEDURE: Trigger Point Injections    Pre-Procedure Diagnoses:  1. Myalgia, other  2. Myofascial pain  3. Muscle spasm    Post-Procedure Diagnoses:  Same    Physician: Evelina Bucy, MD    ANESTHESIA: 50:50 solution of Bupivacaine 0.25% and lidocaine 2%    COMPLICATIONS: None.    Location(s) of pain: As noted above  Presence of point tenderness in a tight band of muscle in above area: Yes  Restricted range of motion: Yes  Conservative therapy attempted for at least 6 weeks:  activity modification, pharmacotherapy  Other components of comprehensive management: pharmacotherapy, activity modification    DESCRIPTION OF PROCEDURE: The procedure risks, hazards and alternatives were discussed with the patient and a proper consent was obtained. The area over each trigger point was prepped with alcohol utilizing sterile technique. After isolating it between two palpating fingertips a 27-gauge 1.5 needle was placed in the center of the myofascial spasms and a negative aspiration was performed. Then 0.5cc of the solution above was injected into each trigger point.     Trigger points in each of the muscle groups noted above were injected.     The patient tolerated the procedure well without any apparent difficulties or complications.            Estimated blood loss: none or minimal  Specimens: none  Patient tolerated the procedure well with no immediate complications. Pressure was applied, and hemostasis was accomplished.

## 2021-11-22 ENCOUNTER — Encounter: Admit: 2021-11-22 | Discharge: 2021-11-22 | Payer: MEDICARE

## 2021-11-25 ENCOUNTER — Encounter: Admit: 2021-11-25 | Discharge: 2021-11-25 | Payer: MEDICARE

## 2021-11-25 DIAGNOSIS — M7918 Myalgia, other site: Secondary | ICD-10-CM

## 2021-12-11 ENCOUNTER — Encounter: Admit: 2021-12-11 | Discharge: 2021-12-11 | Payer: MEDICARE

## 2021-12-14 ENCOUNTER — Ambulatory Visit: Admit: 2021-12-14 | Discharge: 2021-12-14 | Payer: MEDICARE

## 2021-12-14 ENCOUNTER — Encounter: Admit: 2021-12-14 | Discharge: 2021-12-14 | Payer: MEDICARE

## 2021-12-14 DIAGNOSIS — G43909 Migraine, unspecified, not intractable, without status migrainosus: Secondary | ICD-10-CM

## 2021-12-14 DIAGNOSIS — M5412 Radiculopathy, cervical region: Secondary | ICD-10-CM

## 2021-12-14 DIAGNOSIS — R519 Generalized headaches: Secondary | ICD-10-CM

## 2021-12-14 DIAGNOSIS — H409 Unspecified glaucoma: Secondary | ICD-10-CM

## 2021-12-14 DIAGNOSIS — M5030 Other cervical disc degeneration, unspecified cervical region: Secondary | ICD-10-CM

## 2021-12-14 MED ORDER — TRIAMCINOLONE ACETONIDE 40 MG/ML IJ SUSP
80 mg | Freq: Once | EPIDURAL | 0 refills | Status: CP
Start: 2021-12-14 — End: ?
  Administered 2021-12-14: 18:00:00 80 mg via EPIDURAL

## 2021-12-14 MED ORDER — IOHEXOL 240 MG IODINE/ML IV SOLN
1 mL | Freq: Once | EPIDURAL | 0 refills | Status: CP
Start: 2021-12-14 — End: ?
  Administered 2021-12-14: 18:00:00 1 mL via EPIDURAL

## 2021-12-14 NOTE — Procedures
Attending Surgeon: Evelina Bucy, MD    Anesthesia: Local    Pre-Procedure Diagnosis:   1. Cervical radiculopathy    2. DDD (degenerative disc disease), cervical        Post-Procedure Diagnosis:   1. Cervical radiculopathy    2. DDD (degenerative disc disease), cervical        Pain Score: Four    ESI CRV/THRC  Procedure: epidural - interlaminar    Laterality: n/a   on 12/14/2021 12:30 PM  Location: cervical ESI with imaging -  C7-T1      Consent:   Consent obtained: written  Consent given by: patient  Risks discussed: allergic reaction, bleeding, bruising, infection, never damage, no change or worsening in pain, pneumo thorax, reaction to medication, seizure, swelling and weakness  Alternatives discussed: alternative treatment, delayed treatment and no treatment  Discussed with patient the purpose of the treatment/procedure, other ways of treating my condition, including no treatment/ procedure and the risks and benefits of the alternatives. Patient has decided to proceed with treatment/procedure.        Universal Protocol:  Relevant documents: relevant documents present and verified  Site marked: the operative site was marked  Patient identity confirmed: Patient identify confirmed verbally with patient.        Time out: Immediately prior to procedure a time out was called to verify the correct patient, procedure, equipment, support staff and site/side marked as required      Procedures Details:   Indications: pain   Prep: chlorhexidine  Patient position: prone  Estimated Blood Loss: minimal  Specimens: none  Number of Levels: 1  Approach: midline  Guidance: fluoroscopy  Contrast: Procedure confirmed with contrast under live fluoroscopy.  Needle and Epidural Catheter: tuohy  Needle size: 18 G  Injection procedure: Incremental injection and Negative aspiration for blood  Amount Injected:   C7-T1: 4mL  Patient tolerance: Patient tolerated the procedure well with no immediate complications. Pressure was applied, and hemostasis was accomplished.  Outcome: Pain unchanged  Comments:   CERVICAL INTERLAMINAR EPIDURAL STEROID INJECTION    PROCEDURE:  1) C7-T1 interlaminar epidural steroid injection  2) Fluoroscopic needle guidance    REASON FOR PROCEDURE: Cervical radiculopathy, DDD (cervical)    ATTENDING PHYSICIAN: Evelina Bucy, MD    MEDICATIONS INJECTED: 2 mL of triamciniolone (80 mg) and 2 mL of sterile, preservative-free normal saline    LOCAL ANESTHETIC INJECTED: 1 mL of 1% lidocaine    SEDATION MEDICATIONS: None    ESTIMATED BLOOD LOSS: None    SPECIMENS REMOVED: None    COMPLICATIONS: None    TECHNIQUE: Time-out was taken to identify the correct patient, procedure and side prior to starting the procedure. With the patient lying in a prone position with the neck in a slightly  flexed position, the area was prepped and draped in sterile fashion using DuraPrep and a fenestrated drape. The area was determined under fluoroscopic guidance. A 27-gauge, 1.25-inch  needle was used to anesthetize the needle entry site and subcutaneous tissues.     The 18-gauge, 3.5-inch Tuohy needle was advanced through the ligamentum flavum using loss of resistance technique. Once the tip of the needle was thought to be in the desired position, contrast was injected to confirm only epidural spread and no vascular runoff via A-P and lateral  views. The injectate was then injected slowly with intermittent negative aspiration.    The procedure was completed without complications and was tolerated well. The patient was monitored after the procedure. The patient (or responsible  party) was given post-procedure and  discharge instructions to follow at home. The patient was discharged in stable condition.    Evelina Bucy, MD, MBA            Estimated blood loss: none or minimal  Specimens: none  Patient tolerated the procedure well with no immediate complications. Pressure was applied, and hemostasis was accomplished.

## 2021-12-14 NOTE — Progress Notes

## 2021-12-14 NOTE — Progress Notes
[facility]  INTERVENTIONAL PAIN PROCEDURE HISTORY AND PHYSICAL    Chief Complaint:   1. Cervical radiculopathy        HISTORY OF PRESENT ILLNESS:    Patient returns today for interventional treatment of pain secondary to the above chief complaint.   Patient denies any recent fevers, chills, infection, antibiotics, coagulopathy or contra-indicated anticoagulants.   Risks of the procedure were discussed including but not limited to:   Post procedure pain, infection, bleeding, damage to surrounding structures (including muscles, blood vessels, nerve roots, spinal cord) and reaction to medications.   Patient reports understanding and has elected to proceed with the procedure.      Oswestry Total Score:: (P) 20       Medical History:   Diagnosis Date   ? Generalized headaches    ? 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 Migraines        Surgical History:   Procedure Laterality Date   ? ANKLE SURGERY     ? HEART CATHETERIZATION     ? TUBAL LIGATION         family history includes Arthritis in her mother; Cancer in her father; Cataract in her mother; Glaucoma in her mother.    Social History     Socioeconomic History   ? Marital status: Married   ? Highest education level: Some college, no degree   Tobacco Use   ? Smoking status: Never   ? Smokeless tobacco: Never   Vaping Use   ? Vaping Use: Never used   Substance and Sexual Activity   ? Alcohol use: Never   ? Drug use: Never   ? Sexual activity: Yes     Partners: Male     Birth control/protection: None       Allergies   Allergen Reactions   ? Isometheptene CHEST TIGHTNESS, CHILLS and MUSCLE PAIN   ? Isopto Atropine [Atropine] WHEEZING   ? Sulfa (Sulfonamide Antibiotics) UNKNOWN         REVIEW OF SYSTEMS: 10 point ROS obtained and negative except as above.      PHYSICAL EXAM:  General: Alert, cooperative, no acute distress.  HEENT: Normocephalic, atraumatic.  Neck: Supple.  Chest: Unlabored respirations, bilateral and equal chest excursion.  Skin: Dry.  Abdomen: Nondistended.  MSK: Functional movement of all extremities.   Neurological: Alert and oriented x3.  Speech is clear. No tremors.       IMPRESSION:    1. Cervical radiculopathy           PLAN: C7-T1 CESI      ESI Checklist:  This patient's clinical history, exam, AND imaging support radiculopathy AND there is a significant impact on quality of life and function AND the pain has been present for at least 4 weeks AND they have failed to improve with noninvasive conservative care.    This patient's pain is so severe it results in a significant degree of functional disability.  Prior ESI has provided at least a 50% improvement in pain and function for at least 3 months.         Chart reviewed.   Patient expressed understanding and consent to proceed.   Written consent signed.   Case discussed with attending physician.       Gareth Morgan, M.D.   Fellow; Interventional Pain Medicine  Department of Anesthesiology and Pain Medicine   [facility]

## 2022-01-29 ENCOUNTER — Encounter: Admit: 2022-01-29 | Discharge: 2022-01-29 | Payer: MEDICARE

## 2022-01-29 ENCOUNTER — Ambulatory Visit: Admit: 2022-01-29 | Discharge: 2022-01-30 | Payer: MEDICARE

## 2022-01-29 DIAGNOSIS — M7918 Myalgia, other site: Secondary | ICD-10-CM

## 2022-01-29 DIAGNOSIS — M5412 Radiculopathy, cervical region: Secondary | ICD-10-CM

## 2022-01-29 DIAGNOSIS — R519 Generalized headaches: Secondary | ICD-10-CM

## 2022-01-29 DIAGNOSIS — M5030 Other cervical disc degeneration, unspecified cervical region: Secondary | ICD-10-CM

## 2022-01-29 DIAGNOSIS — G43909 Migraine, unspecified, not intractable, without status migrainosus: Secondary | ICD-10-CM

## 2022-01-29 DIAGNOSIS — M47819 Spondylosis without myelopathy or radiculopathy, site unspecified: Secondary | ICD-10-CM

## 2022-01-29 DIAGNOSIS — H409 Unspecified glaucoma: Secondary | ICD-10-CM

## 2022-01-29 NOTE — Progress Notes
[facility]      Subjective     Chief Complaint: neck/head pain    Interval HPI: Rita Gibson is a 71 y.o. female who  has a past medical history of Generalized headaches, Glaucoma (08-16-2018), and Migraines. who now presents for evaluation.    Patient presents to clinic for follow-up of neck pain.    Patient states reports about 50% relief of left neck and trapezius pain after TPI performed on 10/30/2021. Patient states her pain started worsening over the past couple of the months, patient states the relief lasted over 6 months.    Patient reports over 50% after C7-T1 CESI performed on 02/2021, patient states her pain is significantly less severe since procedure. The pain relief typically lasts about 3 months, then gradually returns.    Patient states she is still getting improvement of less frequent HAs and eye pain after bilateral C6-T1 RFA performed on 11/24/2020. She states she did wake up with a headache today, which isn't normal for her.     The pain is in the low neck region, will radiate to the base of the skull to the to the top to the head to the middle forehead and back of the eyes bilaterally  Will also have pain radiate to the shoulders bilaterally, R>L  Patient reports additional sharp stabbing pain noted just under her eyebrow   Denies BUE pain  +HA - mild pressure noted in the forehead region, intermittent, few times a month (well controlled)   Pain is constant  Numbness/tingling: none  The pain ranges 0-3/10  (avg 2/10)  The pain is described as dull, sore, stabbing, sharp, pressure   The pain is exacerbated by household chores, lifting arms above shoulder level (R>L)  The pain is partially alleviated by rest, massage, heating pad, ice, prescribed medications (gabapentin/Toradol)  +muscle stiffness/tightness  Denies strength loss in BUE/BLE    She is also taking Klonopin and Baclofen as prescribed by PCP.     Hx of spine surgery: none     Patient denies fevers, chills, infection, bleeding issues, anticoagulants, new muscle weakness, numbness, tingling, bowel/bladder incontinence, or saddle anesthesia.       ROS:   Review of Systems   Musculoskeletal:  Positive for back pain, myalgias and neck pain.   Neurological:  Positive for headaches.        PRIOR MEDICATIONS:   Effective  Gabapentin  Baclofen      Ineffective  NSAID    Unable to tolerate  Nortriptyline    Never  Lyrica  Cymbalta  Tizanidine      PRIOR INTERVENTIONS:  No spine surgery   Effective  CESI  TPI  Supraorbital nerve blocks  Bilateral C6-T1 RFA    Ineffective  Physician-directed PT  Exercise        Rita Gibson denies any recent fevers, chills, infection, antibiotics, bowel or bladder incontinence, saddle anesthesia, bleeding issues, or recent anticoagulant.       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   Social History     Socioeconomic History    Marital status: Married    Highest education level: Some college, no degree   Tobacco Use    Smoking status: Never    Smokeless tobacco: Never   Vaping Use    Vaping Use: Never used   Substance and Sexual Activity    Alcohol use: Never    Drug use: Never    Sexual activity: Yes     Partners: Male     Birth control/protection: None       Allergies:  Allergies   Allergen Reactions    Isometheptene CHEST TIGHTNESS, CHILLS and MUSCLE PAIN    Isopto Atropine [Atropine] WHEEZING    Sulfa (Sulfonamide Antibiotics) UNKNOWN       Medications:  Current Outpatient Medications on File Prior to Visit   Medication Sig Dispense Refill    alendronate (FOSAMAX) 70 mg tablet       baclofen (LIORESAL) 10 mg tablet Take one tablet by mouth twice daily.      buPROPion SR(+) (WELLBUTRIN-SR) 150 mg tablet Take one tablet by mouth twice daily.      calcium carbonate (CALCIUM 500 PO) Take  by mouth.      cholecalciferol (VITAMIN D-3) 1,000 units tablet Take one tablet by mouth daily.      clonazePAM (KLONOPIN) 1 mg tablet Take one-half tablet by mouth at bedtime as needed.      KETOROLAC TROMETHAMINE (KETOROLAC IJ) Inject  to area(s) as directed.      latanoprost (XALATAN) 0.005 % ophthalmic solution Apply one drop to both eyes at bedtime daily. 7.5 mL 3    methylPREDNIsolone (MEDROL (PAK)) 4 mg tablet Take medication as directed on package for 6 days. Take with food. 21 tablet 0    omeprazole DR(+) (PRILOSEC) 40 mg capsule Take one capsule by mouth daily before breakfast.      rimegepant (NURTEC ODT) 75 mg rapid dissolve tablet Dissolve one tablet by mouth. Place on or under the tongue. Max 75 mg in 24 hours, and 18 doses in 30 days.      rizatriptan (MAXALT-MLT) 10 mg rapid dissolve tablet       rosuvastatin (CRESTOR) 5 mg tablet Take one tablet by mouth at bedtime daily.       No current facility-administered medications on file prior to visit.       Physical examination:   Ht 162.6 cm (5' 4)  - Wt 46.7 kg (103 lb)  - BMI 17.68 kg/m?   Pain Score: Two    Female Opioid Risk Score: 0 (08/09/2018  8:42 AM)  Opioid Risk Category: Low Risk (08/09/2018  8:42 AM)      Exam performed by instructing patient to perform various maneuvers and provide feedback while I personally visualized the patient exam.      General: The patient is a well-developed, well nourished 72 y.o. female in no acute distress.   HEENT: Head is normocephalic and atraumatic.  Pulmonary: The patient has unlabored respirations and bilateral symmetric chest excursion. Mild TTP to right supraorbital region.   Abdomen: Soft, nontender, and nondistended. There is no rebound or guarding per patient assessment.  Extremities: No  clubbing, cyanosis, or edema as seen on video.      Neurologic:   The patient is alert and oriented times 3.       +mild pain with palpation at the base of the occiput bilaterally, L>R, no radiating pain noted     Moderate TTP to upper trapezius and levator scapulae with increased muscle tone bilaterally with trigger points and referred pain, L>R.      Musculoskeletal:        C-Spine    There is mild TTP to cervical paraspinal tenderness. Paraspinal muscle tone is increased with trigger points noted and referred pain with palpation.  ROM with flexion, extension, rotation, and lateral bending is intact but stiff mostly noted with extension.   Patient believes strength is equal and adequate bilaterally in the flexors and extensors of the bilateral upper extremities.         MRI C-Spine Results:  11/2015  C2-3 mild disc desiccation  C3-4 mod loss of disc height  C4-5 mild disc dessication. Left paracentral disc protrusion. mild R FA.   C5-6 +FA. Broad DOC. Left foraminal and paracentral disc protrusion. Miild-mod L NFS. Mild R NFS. Some effacement of thecal sac.  C6-7 DDD.   Spondylosis most pronounced at C5-6.       Last Cr and LFT's:  Creatinine   Date Value Ref Range Status   03/02/2020 0.87 0.4 - 1.00 MG/DL Final     AST (SGOT)   Date Value Ref Range Status   03/01/2020 24 7 - 40 U/L Final     ALT (SGPT)   Date Value Ref Range Status   03/01/2020 19 7 - 56 U/L Final     Alk Phosphatase   Date Value Ref Range Status   03/01/2020 63 25 - 110 U/L Final     Total Bilirubin   Date Value Ref Range Status   03/01/2020 0.7 0.3 - 1.2 MG/DL Final          Assessment:    Rita Gibson is a 71 y.o. female who  has a past medical history of Generalized headaches, Glaucoma (08-16-2018), and Migraines. who presents for evaluation of neck pain.     The pain complaints are most likely due to:    1. Cervical radiculopathy  [place] AMB SPINE INJECT INTERLAM CRV/THRC      2. DDD (degenerative disc disease), cervical  [place] AMB SPINE INJECT INTERLAM CRV/THRC      3. Facet arthropathy        4. Myalgia, other site  [place] AMB SPINE TRIGGER POINT INJECTION          Patient has had an adequate trial of > 3 months of rest, exercise, NSAID's, multimodal treatment, and the passage of time without improvement of symptoms. The pain has significant impact on the daily quality of life.     Plan:    1. Discussed plan of care options with patient. This patient had at least 50% pain relief for at least 6 months with the last epidural injection. Will schedule to repeat C7-T1 CESI at next available appointment.   2. Will continue with cervicothoracic TPI as scheduled.  3. Okay to repeat bilateral C6-T1 RFA after 05/26/2021, if patient HAs worsen.   4. Continue Baclofen as prescribed by PCP.  5. May still consider supraorbital nerve blocks in future, L>R.  6. Follow-up in 3 months.    Risks/benefits of all pharmacologic and interventional treatments discussed and questions answered.  Todays visit took place via face-to-face encounter utilizing Zoom application. Visit Start Time 1525 Visit End Time 1541.

## 2022-02-01 ENCOUNTER — Encounter: Admit: 2022-02-01 | Discharge: 2022-02-01 | Payer: MEDICARE

## 2022-02-03 ENCOUNTER — Encounter: Admit: 2022-02-03 | Discharge: 2022-02-03 | Payer: MEDICARE

## 2022-02-03 ENCOUNTER — Ambulatory Visit: Admit: 2022-02-03 | Discharge: 2022-02-04 | Payer: MEDICARE

## 2022-02-03 DIAGNOSIS — R519 Generalized headaches: Secondary | ICD-10-CM

## 2022-02-03 DIAGNOSIS — H409 Unspecified glaucoma: Secondary | ICD-10-CM

## 2022-02-03 DIAGNOSIS — G43909 Migraine, unspecified, not intractable, without status migrainosus: Secondary | ICD-10-CM

## 2022-02-03 MED ORDER — BUPIVACAINE (PF) 0.5 % (5 MG/ML) IJ SOLN
10 mL | Freq: Once | INTRAMUSCULAR | 0 refills | Status: CP | PRN
Start: 2022-02-03 — End: ?

## 2022-02-03 NOTE — Procedures
[place] AMB SPINE TRIGGER POINT INJECTION  Locations: L upper trapezius, R upper trapezius, L cervical, R cervical, L rhomboid and R rhomboid    Consent:   Consent obtained: written  Consent given by: patient    Discussed with patient the purpose of the treatment/procedure, other ways of treating my condition, including no treatment/ procedure and the risks and benefits of the alternatives. Patient has decided to proceed with treatment/procedure.        Universal Protocol:  Relevant documents: relevant documents present and verified  Imaging studies: imaging studies available  Patient identity confirmed: Patient identify confirmed verbally with patient.          Procedures Details:   Indications: myalgia  Needle size: 27 G  Number of muscles: 3 or more    Approach: posterior    Patient tolerance: Patient tolerated the procedure well with no immediate complications. Pressure was applied, and hemostasis was accomplished.  Medication Administered - 10 mL bupivacaine PF 0.5 %  (No note.)

## 2022-02-03 NOTE — Progress Notes
FUV for cervicothoracic TPI    [facility] Interventional [facility]      Subjective     Chief Complaint: neck/head pain    Interval HPI: Rita Gibson is a 71 y.o. female who  has a past medical history of Generalized headaches, Glaucoma (08-16-2018), and Migraines. who now presents for evaluation.    Patient presents to clinic for follow-up of neck pain.    Patient states reports about 50% relief of left neck and trapezius pain after TPI performed on 10/30/2021. Patient states her pain started worsening over the past couple of the months, patient states the relief lasted over 6 months.    Patient reports over 50% after C7-T1 CESI performed on 02/2021, patient states her pain is significantly less severe since procedure. The pain relief typically lasts about 3 months, then gradually returns.    Patient states she is still getting improvement of less frequent HAs and eye pain after bilateral C6-T1 RFA performed on 11/24/2020. She states she did wake up with a headache today, which isn't normal for her.     The pain is in the low neck region, will radiate to the base of the skull to the to the top to the head to the middle forehead and back of the eyes bilaterally  Will also have pain radiate to the shoulders bilaterally, R>L  Patient reports additional sharp stabbing pain noted just under her eyebrow   Denies BUE pain  +HA - mild pressure noted in the forehead region, intermittent, few times a month (well controlled)   Pain is constant  Numbness/tingling: none  The pain ranges 0-3/10  (avg 2/10)  The pain is described as dull, sore, stabbing, sharp, pressure   The pain is exacerbated by household chores, lifting arms above shoulder level (R>L)  The pain is partially alleviated by rest, massage, heating pad, ice, prescribed medications (gabapentin/Toradol)  +muscle stiffness/tightness  Denies strength loss in BUE/BLE    She is also taking Klonopin and Baclofen as prescribed by PCP.     Hx of spine surgery: none     Patient denies fevers, chills, infection, bleeding issues, anticoagulants, new muscle weakness, numbness, tingling, bowel/bladder incontinence, or saddle anesthesia.       ROS:   Review of Systems   Musculoskeletal:  Positive for back pain, myalgias and neck pain.   Neurological:  Positive for headaches.        PRIOR MEDICATIONS:   Effective  Gabapentin  Baclofen      Ineffective  NSAID    Unable to tolerate  Nortriptyline    Never  Lyrica  Cymbalta  Tizanidine      PRIOR INTERVENTIONS:  No spine surgery   Effective  CESI  TPI  Supraorbital nerve blocks  Bilateral C6-T1 RFA    Ineffective  Physician-directed PT  Exercise        Berna Spare denies any recent fevers, chills, infection, antibiotics, bowel or bladder incontinence, saddle anesthesia, bleeding issues, or recent anticoagulant.       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   Social History     Socioeconomic History    Marital status: Married    Highest education level: Some college, no degree   Tobacco Use    Smoking status: Never    Smokeless tobacco: Never   Vaping Use    Vaping Use: Never used   Substance and Sexual Activity    Alcohol use: Never    Drug use: Never    Sexual activity: Yes     Partners: Male     Birth control/protection: None       Allergies:  Allergies   Allergen Reactions    Isometheptene CHEST TIGHTNESS, CHILLS and MUSCLE PAIN    Isopto Atropine [Atropine] WHEEZING    Sulfa (Sulfonamide Antibiotics) UNKNOWN       Medications:  Current Outpatient Medications on File Prior to Visit   Medication Sig Dispense Refill    alendronate (FOSAMAX) 70 mg tablet       baclofen (LIORESAL) 10 mg tablet Take one tablet by mouth twice daily.      buPROPion SR(+) (WELLBUTRIN-SR) 150 mg tablet Take one tablet by mouth twice daily.      calcium carbonate (CALCIUM 500 PO) Take  by mouth.      cholecalciferol (VITAMIN D-3) 1,000 units tablet Take one tablet by mouth daily.      clonazePAM (KLONOPIN) 1 mg tablet Take one-half tablet by mouth at bedtime as needed.      KETOROLAC TROMETHAMINE (KETOROLAC IJ) Inject  to area(s) as directed.      latanoprost (XALATAN) 0.005 % ophthalmic solution Apply one drop to both eyes at bedtime daily. 7.5 mL 3    methylPREDNIsolone (MEDROL (PAK)) 4 mg tablet Take medication as directed on package for 6 days. Take with food. 21 tablet 0    omeprazole DR(+) (PRILOSEC) 40 mg capsule Take one capsule by mouth daily before breakfast.      rimegepant (NURTEC ODT) 75 mg rapid dissolve tablet Dissolve one tablet by mouth. Place on or under the tongue. Max 75 mg in 24 hours, and 18 doses in 30 days.      rizatriptan (MAXALT-MLT) 10 mg rapid dissolve tablet       rosuvastatin (CRESTOR) 5 mg tablet Take one tablet by mouth at bedtime daily.       No current facility-administered medications on file prior to visit.       Physical examination:   Pulse (P) 73  - Ht 162.6 cm (5' 4)  - Wt 46.7 kg (103 lb)  - SpO2 (P) 100%  - BMI 17.68 kg/m?   Pain Score: Three    Female Opioid Risk Score: 0 (08/09/2018  8:42 AM)  Opioid Risk Category: Low Risk (08/09/2018  8:42 AM)      Exam performed by instructing patient to perform various maneuvers and provide feedback while I personally visualized the patient exam.      General: The patient is a well-developed, well nourished 71 y.o. female in no acute distress.   HEENT: Head is normocephalic and atraumatic.  Pulmonary: The patient has unlabored respirations and bilateral symmetric chest excursion. Mild TTP to right supraorbital region. Abdomen: Soft, nontender,  and nondistended. There is no rebound or guarding per patient assessment.  Extremities: No clubbing, cyanosis, or edema as seen on video.      Neurologic:   The patient is alert and oriented times 3.       +mild pain with palpation at the base of the occiput bilaterally, L>R, no radiating pain noted     Moderate TTP to upper trapezius and levator scapulae with increased muscle tone bilaterally with trigger points and referred pain, L>R.      Musculoskeletal:        C-Spine    There is mild TTP to cervical paraspinal tenderness. Paraspinal muscle tone is increased with trigger points noted and referred pain with palpation.  ROM with flexion, extension, rotation, and lateral bending is intact but stiff mostly noted with extension.   Patient believes strength is equal and adequate bilaterally in the flexors and extensors of the bilateral upper extremities.         MRI C-Spine Results:  11/2015  C2-3 mild disc desiccation  C3-4 mod loss of disc height  C4-5 mild disc dessication. Left paracentral disc protrusion. mild R FA.   C5-6 +FA. Broad DOC. Left foraminal and paracentral disc protrusion. Miild-mod L NFS. Mild R NFS. Some effacement of thecal sac.  C6-7 DDD.   Spondylosis most pronounced at C5-6.       Last Cr and LFT's:  Creatinine   Date Value Ref Range Status   03/02/2020 0.87 0.4 - 1.00 MG/DL Final     AST (SGOT)   Date Value Ref Range Status   03/01/2020 24 7 - 40 U/L Final     ALT (SGPT)   Date Value Ref Range Status   03/01/2020 19 7 - 56 U/L Final     Alk Phosphatase   Date Value Ref Range Status   03/01/2020 63 25 - 110 U/L Final     Total Bilirubin   Date Value Ref Range Status   03/01/2020 0.7 0.3 - 1.2 MG/DL Final          Assessment:    Rita Gibson is a 71 y.o. female who  has a past medical history of Generalized headaches, Glaucoma (08-16-2018), and Migraines. who presents for evaluation of neck pain.     The pain complaints are most likely due to:    1. Myalgia, other site  [place] AMB SPINE TRIGGER POINT INJECTION          Patient has had an adequate trial of > 3 months of rest, exercise, NSAID's, multimodal treatment, and the passage of time without improvement of symptoms. The pain has significant impact on the daily quality of life.     Plan:  TPI administered to bilateral cervicothoracic paraspinals today   Keep appt for upcoming C7-T1 ESI  Okay to repeat bilateral C6-T1 RFA after 05/26/2021, if patient HAs worsen.   May still consider supraorbital nerve blocks in future, L>R.  Follow-up in 3 months.

## 2022-02-04 DIAGNOSIS — M7918 Myalgia, other site: Secondary | ICD-10-CM

## 2022-02-26 ENCOUNTER — Encounter: Admit: 2022-02-26 | Discharge: 2022-02-26 | Payer: MEDICARE

## 2022-03-26 ENCOUNTER — Encounter: Admit: 2022-03-26 | Discharge: 2022-03-26 | Payer: MEDICARE

## 2022-03-26 DIAGNOSIS — M5412 Radiculopathy, cervical region: Secondary | ICD-10-CM

## 2022-03-29 ENCOUNTER — Ambulatory Visit: Admit: 2022-03-29 | Discharge: 2022-03-29 | Payer: MEDICARE

## 2022-03-29 ENCOUNTER — Encounter: Admit: 2022-03-29 | Discharge: 2022-03-29 | Payer: MEDICARE

## 2022-03-29 DIAGNOSIS — G43909 Migraine, unspecified, not intractable, without status migrainosus: Secondary | ICD-10-CM

## 2022-03-29 DIAGNOSIS — H409 Unspecified glaucoma: Secondary | ICD-10-CM

## 2022-03-29 DIAGNOSIS — M5412 Radiculopathy, cervical region: Secondary | ICD-10-CM

## 2022-03-29 DIAGNOSIS — M5030 Other cervical disc degeneration, unspecified cervical region: Secondary | ICD-10-CM

## 2022-03-29 DIAGNOSIS — R519 Generalized headaches: Secondary | ICD-10-CM

## 2022-03-29 MED ORDER — IOHEXOL 240 MG IODINE/ML IV SOLN
1 mL | Freq: Once | EPIDURAL | 0 refills | Status: CP
Start: 2022-03-29 — End: ?
  Administered 2022-03-29: 22:00:00 1 mL via EPIDURAL

## 2022-03-29 MED ORDER — TRIAMCINOLONE ACETONIDE 40 MG/ML IJ SUSP
80 mg | Freq: Once | EPIDURAL | 0 refills | Status: CP
Start: 2022-03-29 — End: ?
  Administered 2022-03-29: 22:00:00 80 mg via EPIDURAL

## 2022-03-29 NOTE — Procedures
Attending Surgeon: Evelina Bucy, MD    Anesthesia: Local    Pre-Procedure Diagnosis:   1. Cervical radiculopathy    2. DDD (degenerative disc disease), cervical        Post-Procedure Diagnosis:   1. Cervical radiculopathy    2. DDD (degenerative disc disease), cervical        Pain Score: Four    ESI CRV/THRC  Procedure: epidural - interlaminar    Laterality: n/a   on 03/29/2022 3:00 PM  Location: cervical ESI with imaging - C7-T1      Consent:   Consent obtained: written  Consent given by: patient  Risks discussed: allergic reaction, bleeding, bruising, infection, never damage, no change or worsening in pain, pneumo thorax, reaction to medication, seizure, swelling and weakness  Alternatives discussed: alternative treatment, delayed treatment and no treatment  Discussed with patient the purpose of the treatment/procedure, other ways of treating my condition, including no treatment/ procedure and the risks and benefits of the alternatives. Patient has decided to proceed with treatment/procedure.        Universal Protocol:  Relevant documents: relevant documents present and verified  Site marked: the operative site was marked  Patient identity confirmed: Patient identify confirmed verbally with patient.        Time out: Immediately prior to procedure a time out was called to verify the correct patient, procedure, equipment, support staff and site/side marked as required      Procedures Details:   Indications: pain   Prep: chlorhexidine  Patient position: prone  Estimated Blood Loss: minimal  Specimens: none  Number of Levels: 1  Approach: midline  Guidance: fluoroscopy  Contrast: Procedure confirmed with contrast under live fluoroscopy.  Needle and Epidural Catheter: tuohy  Needle size: 18 G  Injection procedure: Incremental injection and Negative aspiration for blood  Amount Injected:   C7-T1: 4mL  Patient tolerance: Patient tolerated the procedure well with no immediate complications. Pressure was applied, and hemostasis was accomplished.  Outcome: Pain unchanged  Comments:   CERVICAL INTERLAMINAR EPIDURAL STEROID INJECTION    PROCEDURE:  1) C7-T1 interlaminar epidural steroid injection  2) Fluoroscopic needle guidance    REASON FOR PROCEDURE: Cervical radiculopathy, DDD (cervical)    ATTENDING PHYSICIAN: Evelina Bucy, MD    MEDICATIONS INJECTED: 2 mL of triamciniolone (80 mg) and 2 mL of sterile, preservative-free normal saline    LOCAL ANESTHETIC INJECTED: 1 mL of 1% lidocaine    SEDATION MEDICATIONS: None    ESTIMATED BLOOD LOSS: None    SPECIMENS REMOVED: None    COMPLICATIONS: None    TECHNIQUE: Time-out was taken to identify the correct patient, procedure and side prior to starting the procedure. With the patient lying in a prone position with the neck in a slightly  flexed position, the area was prepped and draped in sterile fashion using DuraPrep and a fenestrated drape. The area was determined under fluoroscopic guidance. A 27-gauge, 1.25-inch  needle was used to anesthetize the needle entry site and subcutaneous tissues.     The 18-gauge, 3.5-inch Tuohy needle was advanced through the ligamentum flavum using loss of resistance technique. Once the tip of the needle was thought to be in the desired position, contrast was injected to confirm only epidural spread and no vascular runoff via A-P and lateral  views. The injectate was then injected slowly with intermittent negative aspiration.    The procedure was completed without complications and was tolerated well. The patient was monitored after the procedure. The patient (or responsible party)  was given post-procedure and  discharge instructions to follow at home. The patient was discharged in stable condition.    Evelina Bucy, MD, MBA        Estimated blood loss: none or minimal  Specimens: none  Patient tolerated the procedure well with no immediate complications. Pressure was applied, and hemostasis was accomplished.

## 2022-03-29 NOTE — Progress Notes
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    Social History     Socioeconomic History    Marital status: Married    Highest education level: Some college, no degree   Tobacco Use    Smoking status: Never    Smokeless tobacco: Never   Vaping Use    Vaping Use: Never used   Substance and Sexual Activity    Alcohol use: Never    Drug use: Never    Sexual activity: Yes     Partners: Male     Birth control/protection: None       Allergies   Allergen Reactions    Isometheptene CHEST TIGHTNESS, CHILLS and MUSCLE PAIN    Isopto Atropine [Atropine] WHEEZING    Sulfa (Sulfonamide Antibiotics) UNKNOWN       Vitals:    03/29/22 1455   BP: 133/77   BP Source: Arm, Right Upper   Pulse: 71   Temp: 37.2 ?C (98.9 ?F)   Resp: 16   SpO2: 98%   TempSrc: Oral   PainSc: Four   Weight: 45.4 kg (100 lb)   Height: 162.6 cm (5' 4)     Pain Score: Four  Oswestry Total Score:: 18    REVIEW OF SYSTEMS: 10 point ROS obtained and negative      PHYSICAL EXAM:    General: Alert, cooperative, no acute distress.  HEENT: Normocephalic, atraumatic.  Neck: Supple.  Lungs: Unlabored respirations, bilateral and equal chest excursion.  Heart: Regular rate.  Skin: Warm and dry to touch.  Abdomen: Nondistended.  MSK: No deformity.  Neurological: Alert and oriented x3.        IMPRESSION:    1. Cervical radiculopathy    2. DDD (degenerative disc disease), cervical         PLAN: Cervical Epidural Steroid Injection C7-T1    This patient's clinical history, exam, AND imaging support radiculopathy AND there is a significant impact on quality of life and function AND the pain has been present for at least 4 weeks AND they have failed to improve with noninvasive conservative care.     This patient had at least 50% pain relief for at least 3 months with the last epidural injection.

## 2022-03-29 NOTE — Progress Notes

## 2022-04-23 ENCOUNTER — Encounter: Admit: 2022-04-23 | Discharge: 2022-04-23 | Payer: MEDICARE

## 2022-05-03 ENCOUNTER — Encounter: Admit: 2022-05-03 | Discharge: 2022-05-03 | Payer: MEDICARE

## 2022-05-10 ENCOUNTER — Encounter: Admit: 2022-05-10 | Discharge: 2022-05-10 | Payer: MEDICARE

## 2022-05-10 ENCOUNTER — Ambulatory Visit: Admit: 2022-05-10 | Discharge: 2022-05-11 | Payer: MEDICARE

## 2022-05-10 DIAGNOSIS — G43909 Migraine, unspecified, not intractable, without status migrainosus: Secondary | ICD-10-CM

## 2022-05-10 DIAGNOSIS — R519 Generalized headaches: Secondary | ICD-10-CM

## 2022-05-10 DIAGNOSIS — M7918 Myalgia, other site: Secondary | ICD-10-CM

## 2022-05-10 DIAGNOSIS — H409 Unspecified glaucoma: Secondary | ICD-10-CM

## 2022-05-10 MED ORDER — LIDOCAINE (PF) 20 MG/ML (2 %) IJ SOLN
5 mL | Freq: Once | INTRAMUSCULAR | 0 refills | Status: CP | PRN
Start: 2022-05-10 — End: ?

## 2022-05-10 MED ORDER — BUPIVACAINE (PF) 0.25 % (2.5 MG/ML) IJ SOLN
5 mL | Freq: Once | INTRAMUSCULAR | 0 refills | Status: CP | PRN
Start: 2022-05-10 — End: ?

## 2022-05-10 NOTE — Procedures
Attending Surgeon: Ok Anis, APRN-NP    Anesthesia: Local    Pre-Procedure Diagnosis:   1. Myalgia, other site        Post-Procedure Diagnosis:   1. Myalgia, other site        [place] [facility] SPINE TRIGGER POINT INJECTION  Locations: L upper trapezius, R upper trapezius, L levator scapulae, R levator scapulae, L cervical, R cervical, L thoracic and R thoracic    Consent:   Consent obtained: written and verbal  Consent given by: patient  Alternatives discussed: alternative treatment  Discussed with patient the purpose of the treatment/procedure, other ways of treating my condition, including no treatment/ procedure and the risks and benefits of the alternatives. Patient has decided to proceed with treatment/procedure.        Universal Protocol:  Relevant documents: relevant documents present and verified  Test results: test results available and properly labeled  Imaging studies: imaging studies not available  Required items: required blood products, implants, devices, and special equipment not available  Site marked: the operative site was not marked  Patient identity confirmed: Patient identify confirmed verbally with patient.        Time out: Immediately prior to procedure a time out was called to verify the correct patient, procedure, equipment, support staff and site/side marked as required      Procedures Details:   Indications: cervicalgia and myalgia  Prep: alcohol  Needle size: 27 G  Number of muscles: 3 or more      Patient tolerance: Patient tolerated the procedure well with no immediate complications. Pressure was applied, and hemostasis was accomplished.  Right Medication Administered - 5 mL bupivacaine PF 0.25 %; 5 mL lidocaine (PF) 20 mg/mL (2 %)  Medication Administered - 5 mL bupivacaine PF 0.25 %; 5 mL lidocaine (PF) 20 mg/mL (2 %)  Estimated blood loss: none or minimal  Specimens: none  Patient tolerated the procedure well with no immediate complications. Pressure was applied, and hemostasis was accomplished.

## 2022-05-10 NOTE — Progress Notes
[facility] - Interventional Pain      Subjective     Chief Complaint: neck/head pain    Interval HPI: Rita Gibson is a 72 y.o. female who  has a past medical history of Generalized headaches, Glaucoma (08-16-2018), and Migraines. who now presents for evaluation.    Patient presents to clinic for follow-up of neck pain.    Patient states reports about 50% relief of left neck and trapezius pain after TPI performed on 02/03/2022. Patient states her pain started worsening over the last couple of weeks, most noted is some specific spots. Patient states that her relief has lasted about 3 months.    Patient reports over 50% after C7-T1 CESI performed on 01/26/2022, patient states her pain is significantly less severe and now intermittent in nature since procedure. The pain relief typically lasts about 3 months, then gradually returns.    Patient states she is still getting improvement of less frequent HAs and eye pain after bilateral C6-T1 RFA performed on 11/24/2020. She states she did wake up with a headache today, which isn't normal for her.     The pain is in the low neck region, will radiate to the base of the skull to the to the top to the head to the middle forehead and back of the eyes bilaterally  Will also have pain radiate to the shoulders bilaterally, R>L  Patient reports additional sharp stabbing pain noted just under her eyebrow   Denies BUE pain  +HA - mild pressure noted in the forehead region, intermittent, few times a month (well controlled)   Pain is intermittent  Numbness/tingling: none  The pain ranges 0-5/10  (avg 3-4/10)  The pain is described as dull, sore, stabbing, sharp, pressure   The pain is exacerbated by household chores, lifting arms above shoulder level (R>L)  The pain is partially alleviated by rest, massage, heating pad, ice, prescribed medications (gabapentin/Toradol)  +muscle stiffness/tightness  Denies strength loss in BUE/BLE    She is also taking Klonopin and Baclofen as prescribed by PCP.     Hx of spine surgery: none     Patient denies fevers, chills, infection, bleeding issues, anticoagulants, new muscle weakness, numbness, tingling, bowel/bladder incontinence, or saddle anesthesia.       ROS:   Review of Systems   Musculoskeletal:  Positive for back pain, myalgias and neck pain.   Neurological:  Positive for headaches.        PRIOR MEDICATIONS:   Effective  Gabapentin  Baclofen      Ineffective  NSAID    Unable to tolerate  Nortriptyline    Never  Lyrica  Cymbalta  Tizanidine      PRIOR INTERVENTIONS:  No spine surgery   Effective  CESI  TPI  Supraorbital nerve blocks  Bilateral C6-T1 RFA    Ineffective  Physician-directed PT  Exercise        Berna Spare denies any recent fevers, chills, infection, antibiotics, bowel or bladder incontinence, saddle anesthesia, bleeding issues, or recent anticoagulant.       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   Social History     Socioeconomic History    Marital status: Married    Highest education level: Some college, no degree   Tobacco Use    Smoking status: Never    Smokeless tobacco: Never   Vaping Use    Vaping status: Never Used   Substance and Sexual Activity    Alcohol use: Never    Drug use: Never    Sexual activity: Yes     Partners: Male     Birth control/protection: None       Allergies:  Allergies   Allergen Reactions    Isometheptene CHEST TIGHTNESS, CHILLS and MUSCLE PAIN    Isopto Atropine [Atropine] WHEEZING    Sulfa (Sulfonamide Antibiotics) UNKNOWN Medications:  Current Outpatient Medications on File Prior to Visit   Medication Sig Dispense Refill    baclofen (LIORESAL) 10 mg tablet Take one tablet by mouth twice daily.      buPROPion SR(+) (WELLBUTRIN-SR) 150 mg tablet Take one tablet by mouth twice daily.      cholecalciferol (VITAMIN D-3) 1,000 units tablet Take one tablet by mouth daily.      clonazePAM (KLONOPIN) 1 mg tablet Take one-half tablet by mouth at bedtime as needed.      KETOROLAC TROMETHAMINE (KETOROLAC IJ) Inject  to area(s) as directed.      omeprazole DR(+) (PRILOSEC) 40 mg capsule Take one capsule by mouth daily before breakfast.      rosuvastatin (CRESTOR) 5 mg tablet Take one tablet by mouth at bedtime daily.       No current facility-administered medications on file prior to visit.       Physical examination:   BP (!) 157/86  - Pulse 70  - Ht 162.6 cm (5' 4)  - Wt 45.4 kg (100 lb)  - SpO2 95%  - BMI 17.16 kg/m?   Pain Score: Six    Female Opioid Risk Score: 0 (08/09/2018  8:42 AM)  Opioid Risk Category: Low Risk (08/09/2018  8:42 AM)         General: The patient is a well-developed, well nourished 72 y.o. female in no acute distress.   HEENT: Head is normocephalic and atraumatic.  Pulmonary: The patient has unlabored respirations and bilateral symmetric chest excursion. Mild TTP to right supraorbital region.   Abdomen: Soft, nontender, and nondistended. There is no rebound or guarding per patient assessment.  Extremities: No clubbing, cyanosis, or edema as seen on video.      Neurologic:   The patient is alert and oriented times 3.       +mild pain with palpation at the base of the occiput bilaterally, L>R, no radiating pain noted     Moderate TTP to upper trapezius and levator scapulae with increased muscle tone bilaterally with trigger points and referred pain, L>R.      Musculoskeletal:   Gait is normal.      C-Spine    There is mild-moderate TTP to cervical paraspinal tenderness. Paraspinal muscle tone is increased with trigger points noted and referred pain with palpation.  ROM with flexion, extension, rotation, and lateral bending  is intact but stiff mostly noted with extension and bending.   Patient believes strength is equal and adequate bilaterally in the flexors and extensors of the bilateral upper extremities.         MRI C-Spine Results:  11/2015  C2-3 mild disc desiccation  C3-4 mod loss of disc height  C4-5 mild disc dessication. Left paracentral disc protrusion. mild R FA.   C5-6 +FA. Broad DOC. Left foraminal and paracentral disc protrusion. Miild-mod L NFS. Mild R NFS. Some effacement of thecal sac.  C6-7 DDD.   Spondylosis most pronounced at C5-6.       Last Cr and LFT's:  Creatinine   Date Value Ref Range Status   03/02/2020 0.87 0.4 - 1.00 MG/DL Final     AST (SGOT)   Date Value Ref Range Status   03/01/2020 24 7 - 40 U/L Final     ALT (SGPT)   Date Value Ref Range Status   03/01/2020 19 7 - 56 U/L Final     Alk Phosphatase   Date Value Ref Range Status   03/01/2020 63 25 - 110 U/L Final     Total Bilirubin   Date Value Ref Range Status   03/01/2020 0.7 0.3 - 1.2 MG/DL Final          Assessment:    Adraya Howton is a 72 y.o. female who  has a past medical history of Generalized headaches, Glaucoma (08-16-2018), and Migraines. who presents for evaluation of neck pain.     The pain complaints are most likely due to:    1. Cervical radiculopathy  [place] AMB SPINE INJECT INTERLAM CRV/THRC      2. Myalgia, other site  [place] AMB SPINE TRIGGER POINT INJECTION    [place] AMB SPINE TRIGGER POINT INJECTION      3. DDD (degenerative disc disease), cervical  [place] [facility]B SPINE INJECT INTERLAM CRV/THRC          Patient has had an adequate trial of > 3 months of rest, exercise, NSAID's, multimodal treatment, and the passage of time without improvement of symptoms. The pain has significant impact on the daily quality of life.     Plan:    1. Discussed plan of care options with patient. Will perform cervicothoracic TPI today.   2. This patient had at least 50% pain relief for at least 3-6 months with the last epidural injection. Will schedule to repeat C7-T1 CESI at next available appointment.   3. Okay to repeat bilateral C6-T1 RFA after 05/26/2021, if patient HAs worsen.   4. Continue Baclofen as prescribed by PCP.  5. May still consider supraorbital nerve blocks in future, L>R.  6. Follow-up in 3 months.    Risks/benefits of all pharmacologic and interventional treatments discussed and questions answered.

## 2022-05-11 DIAGNOSIS — M5030 Other cervical disc degeneration, unspecified cervical region: Secondary | ICD-10-CM

## 2022-05-11 DIAGNOSIS — M5412 Radiculopathy, cervical region: Secondary | ICD-10-CM

## 2022-06-23 ENCOUNTER — Encounter: Admit: 2022-06-23 | Discharge: 2022-06-23 | Payer: MEDICARE

## 2022-06-28 ENCOUNTER — Encounter: Admit: 2022-06-28 | Discharge: 2022-06-28 | Payer: MEDICARE

## 2022-06-28 ENCOUNTER — Ambulatory Visit: Admit: 2022-06-28 | Discharge: 2022-06-28 | Payer: MEDICARE

## 2022-06-28 DIAGNOSIS — R519 Generalized headaches: Secondary | ICD-10-CM

## 2022-06-28 DIAGNOSIS — M5412 Radiculopathy, cervical region: Secondary | ICD-10-CM

## 2022-06-28 DIAGNOSIS — M5030 Other cervical disc degeneration, unspecified cervical region: Secondary | ICD-10-CM

## 2022-06-28 DIAGNOSIS — G43909 Migraine, unspecified, not intractable, without status migrainosus: Secondary | ICD-10-CM

## 2022-06-28 DIAGNOSIS — H409 Unspecified glaucoma: Secondary | ICD-10-CM

## 2022-06-28 MED ORDER — TRIAMCINOLONE ACETONIDE 40 MG/ML IJ SUSP
80 mg | Freq: Once | EPIDURAL | 0 refills | Status: CP
Start: 2022-06-28 — End: ?
  Administered 2022-06-28: 19:00:00 80 mg via EPIDURAL

## 2022-06-28 MED ORDER — IOHEXOL 240 MG IODINE/ML IV SOLN
1 mL | Freq: Once | EPIDURAL | 0 refills | Status: CP
Start: 2022-06-28 — End: ?
  Administered 2022-06-28: 19:00:00 1 mL via EPIDURAL

## 2022-06-28 MED ORDER — SODIUM CHLORIDE 0.9 % IJ SOLN
3 mL | Freq: Once | INTRAMUSCULAR | 0 refills | Status: CP
Start: 2022-06-28 — End: ?
  Administered 2022-06-28: 19:00:00 3 mL via INTRAMUSCULAR

## 2022-06-28 NOTE — Patient Instructions
Procedure Completed Today: Cervical Epidural Steroid Injection    Important information following your procedure today: You may drive today    Pain relief may not be immediate. It is possible you may even experience an increase in pain during the first 24-48 hours followed by a gradual decrease of your pain.  Though the procedure is generally safe and complications are rare, we Calise Dunckel ask that you be aware of any of the following:   Any swelling, persistent redness, new bleeding, or drainage from the site of the injection.  You should not experience a severe headache.  You should not run a fever over 101? F.  New onset of sharp, severe back & or neck pain.  New onset of upper or lower extremity numbness or weakness.  New difficulty controlling bowel or bladder function after the injection.  New shortness of breath.    If any of these occur, please call to report this occurrence to Dr. Latif at [phone]. If you are calling after 4:00 p.m., on weekends or holidays please call [phone] and ask to have the resident physician on call for the physician paged or go to your local emergency room.  You may experience soreness at the injection site. Ice can be applied at 20 minute intervals. Avoid application of direct heat, hot showers or hot tubs today.  Avoid strenuous activity today. You may resume your regular activities and exercise tomorrow.  Patients with diabetes may see an elevation in blood sugars for 7-10 days after the injection. It is important to pay close attention to your diet, check your blood sugars daily and report extreme elevations to the physician that treats your diabetes.  Patients taking a daily blood thinner can resume their regular dose this evening.  It is important that you take all medications ordered by your pain physician. Taking medication as ordered is an important part of your pain care plan. If you cannot continue the medication plan, please notify the physician.     Possible side effects to steroids that may occur:  Flushing or redness of the face  Irritability  Fluid retention  Change in women?s menses    The following medications were used: Lidocaine , Triamcinolone  , and Contrast Dye

## 2022-06-28 NOTE — Procedures
Attending Surgeon: Evelina Bucy, MD    Anesthesia: Local    Pre-Procedure Diagnosis:   1. Cervical radiculopathy    2. DDD (degenerative disc disease), cervical        Post-Procedure Diagnosis:   1. Cervical radiculopathy    2. DDD (degenerative disc disease), cervical        ESI CRV/THRC  Procedure: epidural - interlaminar    Laterality: n/a   on 06/28/2022 1:15 PM  Location: cervical ESI with imaging - C7-T1      Consent:   Consent obtained: written  Consent given by: patient  Risks discussed: allergic reaction, bleeding, bruising, infection, never damage, no change or worsening in pain, pneumo thorax, reaction to medication, seizure, swelling and weakness  Alternatives discussed: alternative treatment, delayed treatment and no treatment  Discussed with patient the purpose of the treatment/procedure, other ways of treating my condition, including no treatment/ procedure and the risks and benefits of the alternatives. Patient has decided to proceed with treatment/procedure.        Universal Protocol:  Relevant documents: relevant documents present and verified  Site marked: the operative site was marked  Patient identity confirmed: Patient identify confirmed verbally with patient.        Time out: Immediately prior to procedure a time out was called to verify the correct patient, procedure, equipment, support staff and site/side marked as required      Procedures Details:   Indications: pain   Prep: chlorhexidine  Patient position: prone  Estimated Blood Loss: minimal  Specimens: none  Number of Levels: 1  Approach: midline  Guidance: fluoroscopy  Contrast: Procedure confirmed with contrast under live fluoroscopy.  Needle and Epidural Catheter: tuohy  Needle size: 18 G  Injection procedure: Incremental injection and Negative aspiration for blood  Amount Injected:   C7-T1: 4mL  Patient tolerance: Patient tolerated the procedure well with no immediate complications. Pressure was applied, and hemostasis was accomplished.  Outcome: Pain unchanged  Comments:   CERVICAL INTERLAMINAR EPIDURAL STEROID INJECTION    PROCEDURE:  1) C7-T1 interlaminar epidural steroid injection  2) Fluoroscopic needle guidance    REASON FOR PROCEDURE: Cervical radiculopathy, DDD (cervical)    ATTENDING PHYSICIAN: Evelina Bucy, MD    MEDICATIONS INJECTED: 2 mL of triamciniolone (80 mg) and 2 mL of sterile, preservative-free normal saline    LOCAL ANESTHETIC INJECTED: 1 mL of 1% lidocaine    SEDATION MEDICATIONS: None    ESTIMATED BLOOD LOSS: None    SPECIMENS REMOVED: None    COMPLICATIONS: None    TECHNIQUE: Time-out was taken to identify the correct patient, procedure and side prior to starting the procedure. With the patient lying in a prone position with the neck in a slightly  flexed position, the area was prepped and draped in sterile fashion using DuraPrep and a fenestrated drape. The area was determined under fluoroscopic guidance. A 27-gauge, 1.25-inch  needle was used to anesthetize the needle entry site and subcutaneous tissues.     The 18-gauge, 3.5-inch Tuohy needle was advanced through the ligamentum flavum using loss of resistance technique. Once the tip of the needle was thought to be in the desired position, contrast was injected to confirm only epidural spread and no vascular runoff via A-P and lateral  views. The injectate was then injected slowly with intermittent negative aspiration.    The procedure was completed without complications and was tolerated well. The patient was monitored after the procedure. The patient (or responsible party) was given post-procedure and  discharge  instructions to follow at home. The patient was discharged in stable condition.    Evelina Bucy, MD, MBA      Estimated blood loss: none or minimal  Specimens: none  Patient tolerated the procedure well with no immediate complications. Pressure was applied, and hemostasis was accomplished.  Administrations This Visit       iohexoL (OMNIPAQUE-240) 240 mg/mL injection 1 mL       Admin Date  06/28/2022 Action  Given Dose  1 mL Route  Epidural Documented By  Evelina Bucy, MD              sodium chloride PF 0.9% injection 3 mL       Admin Date  06/28/2022 Action  Given Dose  3 mL Route  Injection Documented By  Evelina Bucy, MD              triamcinolone acetonide (KENALOG-40) injection 80 mg       Admin Date  06/28/2022 Action  Given Dose  80 mg Route  Epidural Documented By  Evelina Bucy, MD

## 2022-06-28 NOTE — Progress Notes
[facility]  INTERVENTIONAL PAIN PROCEDURE HISTORY AND PHYSICAL    Chief Complaint: Pain    HISTORY OF PRESENT ILLNESS:  Rita Gibson presents today for interventional treatment of her pain. She denies any fevers, chills, infection, or current use of contraindicated anticoagulants. Risks of the procedure were discussed including but not limited to bleeding, infection, damage to surrounding structures and reaction to medications. She reports understanding and has elected to proceed with the procedure.    This patient's clinical history, exam, AND imaging support radiculopathy AND there is a significant impact on quality of life and function AND their pain score has been documented in this note AND the pain has been present for at least 4 weeks AND they have failed to improve with noninvasive conservative care.     This patient had at least 50% pain relief for at least 3 months with the last epidural injection. The pain score was 8/10 prior to the injection and 3/10 following the injection.      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    Social History     Socioeconomic History    Marital status: Married    Highest education level: Some college, no degree   Tobacco Use    Smoking status: Never    Smokeless tobacco: Never   Vaping Use    Vaping status: Never Used   Substance and Sexual Activity    Alcohol use: Never    Drug use: Never    Sexual activity: Yes     Partners: Male     Birth control/protection: None       Allergies   Allergen Reactions    Isometheptene CHEST TIGHTNESS, CHILLS and MUSCLE PAIN    Isopto Atropine [Atropine] WHEEZING    Sulfa (Sulfonamide Antibiotics) UNKNOWN       There were no vitals filed for this visit.    REVIEW OF SYSTEMS: 10 point ROS obtained and negative except pain    PHYSICAL EXAM:  General: Alert, cooperative, no distress  Head: Normocephalic, atraumatic  Lungs: Unlabored respirations  Heart: Well perfused  Abdomen: Non-distended  Musculoskeletal: Moves all extremities  Neurological: Grossly intact      IMPRESSION:  No diagnosis found.     PLAN: Cervical Epidural Steroid Injection- C7-T1      Electronic consent signed.

## 2022-06-28 NOTE — Progress Notes

## 2022-07-28 ENCOUNTER — Encounter: Admit: 2022-07-28 | Discharge: 2022-07-28 | Payer: MEDICARE

## 2022-07-28 DIAGNOSIS — Z1239 Encounter for other screening for malignant neoplasm of breast: Secondary | ICD-10-CM

## 2022-07-29 ENCOUNTER — Encounter: Admit: 2022-07-29 | Discharge: 2022-07-29 | Payer: MEDICARE

## 2022-08-16 ENCOUNTER — Encounter: Admit: 2022-08-16 | Discharge: 2022-08-16 | Payer: MEDICARE

## 2022-08-16 ENCOUNTER — Ambulatory Visit: Admit: 2022-08-16 | Discharge: 2022-08-17 | Payer: MEDICARE

## 2022-08-16 DIAGNOSIS — H409 Unspecified glaucoma: Secondary | ICD-10-CM

## 2022-08-16 DIAGNOSIS — M47819 Spondylosis without myelopathy or radiculopathy, site unspecified: Secondary | ICD-10-CM

## 2022-08-16 DIAGNOSIS — G43909 Migraine, unspecified, not intractable, without status migrainosus: Secondary | ICD-10-CM

## 2022-08-16 DIAGNOSIS — M7918 Myalgia, other site: Secondary | ICD-10-CM

## 2022-08-16 DIAGNOSIS — R519 Generalized headaches: Secondary | ICD-10-CM

## 2022-08-17 DIAGNOSIS — M5030 Other cervical disc degeneration, unspecified cervical region: Secondary | ICD-10-CM

## 2022-08-17 DIAGNOSIS — M5412 Radiculopathy, cervical region: Secondary | ICD-10-CM

## 2022-09-07 ENCOUNTER — Encounter: Admit: 2022-09-07 | Discharge: 2022-09-07 | Payer: MEDICARE

## 2022-09-07 DIAGNOSIS — R923 Dense breast tissue: Secondary | ICD-10-CM

## 2022-09-23 ENCOUNTER — Encounter: Admit: 2022-09-23 | Discharge: 2022-09-23 | Payer: MEDICARE

## 2022-09-27 ENCOUNTER — Encounter: Admit: 2022-09-27 | Discharge: 2022-09-27 | Payer: MEDICARE

## 2022-09-27 ENCOUNTER — Ambulatory Visit: Admit: 2022-09-27 | Discharge: 2022-09-27 | Payer: MEDICARE

## 2022-09-27 DIAGNOSIS — H409 Unspecified glaucoma: Secondary | ICD-10-CM

## 2022-09-27 DIAGNOSIS — R923 Dense breast tissue: Secondary | ICD-10-CM

## 2022-09-27 DIAGNOSIS — R519 Generalized headaches: Secondary | ICD-10-CM

## 2022-09-27 DIAGNOSIS — G43909 Migraine, unspecified, not intractable, without status migrainosus: Secondary | ICD-10-CM

## 2022-09-27 DIAGNOSIS — Z1239 Encounter for other screening for malignant neoplasm of breast: Secondary | ICD-10-CM

## 2022-09-30 ENCOUNTER — Encounter: Admit: 2022-09-30 | Discharge: 2022-09-30 | Payer: MEDICARE

## 2022-09-30 ENCOUNTER — Ambulatory Visit: Admit: 2022-09-30 | Discharge: 2022-09-30 | Payer: MEDICARE

## 2022-09-30 DIAGNOSIS — G43909 Migraine, unspecified, not intractable, without status migrainosus: Secondary | ICD-10-CM

## 2022-09-30 DIAGNOSIS — H409 Unspecified glaucoma: Secondary | ICD-10-CM

## 2022-09-30 DIAGNOSIS — M5412 Radiculopathy, cervical region: Secondary | ICD-10-CM

## 2022-09-30 DIAGNOSIS — R519 Generalized headaches: Secondary | ICD-10-CM

## 2022-09-30 DIAGNOSIS — M5030 Other cervical disc degeneration, unspecified cervical region: Secondary | ICD-10-CM

## 2022-09-30 MED ORDER — SODIUM CHLORIDE 0.9 % IJ SOLN
3 mL | Freq: Once | INTRAMUSCULAR | 0 refills | Status: DC
Start: 2022-09-30 — End: 2022-09-30

## 2022-09-30 MED ORDER — IOHEXOL 240 MG IODINE/ML IV SOLN
1 mL | Freq: Once | EPIDURAL | 0 refills | Status: CP
Start: 2022-09-30 — End: ?
  Administered 2022-09-30: 13:00:00 1 mL via EPIDURAL

## 2022-09-30 MED ORDER — TRIAMCINOLONE ACETONIDE 40 MG/ML IJ SUSP
80 mg | Freq: Once | EPIDURAL | 0 refills | Status: CP
Start: 2022-09-30 — End: ?
  Administered 2022-09-30: 13:00:00 80 mg via EPIDURAL

## 2022-09-30 NOTE — Procedures
Attending Surgeon: Evelina Bucy, MD    Anesthesia: Local    Pre-Procedure Diagnosis:   1. Cervical radiculopathy    2. DDD (degenerative disc disease), cervical        Post-Procedure Diagnosis:   1. Cervical radiculopathy    2. DDD (degenerative disc disease), cervical        ESI CRV/THRC  Procedure: epidural - interlaminar    Laterality: n/a   on 09/30/2022 8:15 AM  Location: cervical ESI with imaging - C7-T1      Consent:   Consent obtained: written  Consent given by: patient  Risks discussed: allergic reaction, bleeding, bruising, infection, never damage, no change or worsening in pain, pneumo thorax, reaction to medication, seizure, swelling and weakness  Alternatives discussed: alternative treatment, delayed treatment and no treatment  Discussed with patient the purpose of the treatment/procedure, other ways of treating my condition, including no treatment/ procedure and the risks and benefits of the alternatives. Patient has decided to proceed with treatment/procedure.        Universal Protocol:  Relevant documents: relevant documents present and verified  Site marked: the operative site was marked  Patient identity confirmed: Patient identify confirmed verbally with patient.        Time out: Immediately prior to procedure a time out was called to verify the correct patient, procedure, equipment, support staff and site/side marked as required      Procedures Details:   Indications: pain   Prep: chlorhexidine  Patient position: prone  Estimated Blood Loss: minimal  Specimens: none  Number of Levels: 1  Approach: midline  Guidance: fluoroscopy  Contrast: Procedure confirmed with contrast under live fluoroscopy.  Needle and Epidural Catheter: tuohy  Needle size: 18 G  Injection procedure: Incremental injection and Negative aspiration for blood  Amount Injected:   C7-T1: 4mL  Patient tolerance: Patient tolerated the procedure well with no immediate complications. Pressure was applied, and hemostasis was accomplished.  Outcome: Pain unchanged  Comments:   CERVICAL INTERLAMINAR EPIDURAL STEROID INJECTION    PROCEDURE:  1) C7-T1 interlaminar epidural steroid injection  2) Fluoroscopic needle guidance    REASON FOR PROCEDURE: Cervical radiculopathy, DDD (cervical)    ATTENDING PHYSICIAN: Evelina Bucy, MD    MEDICATIONS INJECTED: 2 mL of triamciniolone (80 mg) and 2 mL of sterile, preservative-free normal saline    LOCAL ANESTHETIC INJECTED: 1 mL of 1% lidocaine    SEDATION MEDICATIONS: None    ESTIMATED BLOOD LOSS: None    SPECIMENS REMOVED: None    COMPLICATIONS: None    TECHNIQUE: Time-out was taken to identify the correct patient, procedure and side prior to starting the procedure. With the patient lying in a prone position with the neck in a slightly  flexed position, the area was prepped and draped in sterile fashion using DuraPrep and a fenestrated drape. The area was determined under fluoroscopic guidance. A 27-gauge, 1.25-inch  needle was used to anesthetize the needle entry site and subcutaneous tissues.     The 18-gauge, 3.5-inch Tuohy needle was advanced through the ligamentum flavum using loss of resistance technique. Once the tip of the needle was thought to be in the desired position, contrast was injected to confirm only epidural spread and no vascular runoff via A-P and lateral  views. The injectate was then injected slowly with intermittent negative aspiration.    The procedure was completed without complications and was tolerated well. The patient was monitored after the procedure. The patient (or responsible party) was given post-procedure and  discharge  instructions to follow at home. The patient was discharged in stable condition.    Evelina Bucy, MD, MBA      Estimated blood loss: none or minimal  Specimens: none  Patient tolerated the procedure well with no immediate complications. Pressure was applied, and hemostasis was accomplished.  Administrations This Visit       iohexoL (OMNIPAQUE-240) 240 mg/mL injection 1 mL       Admin Date  09/30/2022 Action  Given Dose  1 mL Route  Epidural Documented By  Evelina Bucy, MD              triamcinolone acetonide (KENALOG-40) injection 80 mg       Admin Date  09/30/2022 Action  Given Dose  80 mg Route  Epidural Documented By  Evelina Bucy, MD

## 2022-09-30 NOTE — Progress Notes
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    Social History     Socioeconomic History    Marital status: Married    Highest education level: Some college, no degree   Tobacco Use    Smoking status: Never    Smokeless tobacco: Never   Vaping Use    Vaping status: Never Used   Substance and Sexual Activity    Alcohol use: Never    Drug use: Never    Sexual activity: Yes     Partners: Male     Birth control/protection: None       Allergies   Allergen Reactions    Isometheptene CHEST TIGHTNESS, CHILLS and MUSCLE PAIN    Isopto Atropine [Atropine] WHEEZING    Sulfa (Sulfonamide Antibiotics) UNKNOWN       There were no vitals filed for this visit.     Oswestry Total Score:: 12    REVIEW OF SYSTEMS: 10 point ROS obtained and negative      PHYSICAL EXAM:    General: Alert, cooperative, no acute distress.  HEENT: Normocephalic, atraumatic.  Neck: Supple.  Lungs: Unlabored respirations, bilateral and equal chest excursion.  Heart: Regular rate.  Skin: Warm and dry to touch.  Abdomen: Nondistended.  MSK: No deformity.  Neurological: Alert and oriented x3.        IMPRESSION:    1. Cervical radiculopathy    2. DDD (degenerative disc disease), cervical         PLAN: Cervical Epidural Steroid Injection C7-T1     This patient's clinical history, exam, AND imaging support radiculopathy AND there is a significant impact on quality of life and function AND the pain has been present for at least 4 weeks AND they have failed to improve with noninvasive conservative care.

## 2022-09-30 NOTE — Progress Notes

## 2022-11-01 ENCOUNTER — Encounter: Admit: 2022-11-01 | Discharge: 2022-11-01 | Payer: MEDICARE

## 2022-11-01 ENCOUNTER — Ambulatory Visit: Admit: 2022-11-01 | Discharge: 2022-11-02 | Payer: MEDICARE

## 2022-11-01 DIAGNOSIS — H269 Unspecified cataract: Secondary | ICD-10-CM

## 2022-11-01 DIAGNOSIS — H409 Unspecified glaucoma: Secondary | ICD-10-CM

## 2022-11-01 DIAGNOSIS — G43909 Migraine, unspecified, not intractable, without status migrainosus: Secondary | ICD-10-CM

## 2022-11-01 DIAGNOSIS — M7918 Myalgia, other site: Secondary | ICD-10-CM

## 2022-11-01 DIAGNOSIS — R519 Generalized headaches: Secondary | ICD-10-CM

## 2022-11-01 NOTE — Patient Instructions
General Instructions:  How to reach me: Please send a MyChart message to the [facility] or leave a voicemail for my nurse Rebecca at [phone].  Scheduling: Our scheduling phone number is [phone].  How to get a medication refill: Five business days before refill needed, please use the MyChart Refill request or contact your pharmacy directly to request medication refills.   How to receive your test results: If you have signed up for MyChart, you will receive your test results and messages from me this way. Otherwise, you will get a phone call or letter. If you are expecting results and have not heard from my office within 2 weeks of your testing, please send a MyChart message or call my office.  Support for many chronic illnesses is available through [facility]: [email or web address] or [phone].  For questions on nights, weekends or holidays, call the operator at [phone], and ask for the doctor on call for Anesthesia Pain Management.

## 2022-11-02 ENCOUNTER — Encounter: Admit: 2022-11-02 | Discharge: 2022-11-02 | Payer: MEDICARE

## 2022-11-22 ENCOUNTER — Encounter: Admit: 2022-11-22 | Discharge: 2022-11-22 | Payer: MEDICARE

## 2022-12-02 ENCOUNTER — Encounter: Admit: 2022-12-02 | Discharge: 2022-12-02 | Payer: MEDICARE

## 2022-12-23 ENCOUNTER — Encounter: Admit: 2022-12-23 | Discharge: 2022-12-23 | Payer: MEDICARE

## 2023-01-03 ENCOUNTER — Ambulatory Visit: Admit: 2023-01-03 | Discharge: 2023-01-04 | Payer: MEDICARE

## 2023-01-03 ENCOUNTER — Encounter: Admit: 2023-01-03 | Discharge: 2023-01-03 | Payer: MEDICARE

## 2023-01-03 DIAGNOSIS — M5413 Radiculopathy, cervicothoracic region: Secondary | ICD-10-CM

## 2023-01-03 DIAGNOSIS — M5030 Other cervical disc degeneration, unspecified cervical region: Secondary | ICD-10-CM

## 2023-01-03 DIAGNOSIS — M5412 Radiculopathy, cervical region: Secondary | ICD-10-CM

## 2023-01-03 MED ORDER — TRIAMCINOLONE ACETONIDE 40 MG/ML IJ SUSP
80 mg | Freq: Once | EPIDURAL | 0 refills | Status: CP
Start: 2023-01-03 — End: ?
  Administered 2023-01-03: 19:00:00 80 mg via EPIDURAL

## 2023-01-03 MED ORDER — SODIUM CHLORIDE 0.9 % IJ SOLN
3 mL | Freq: Once | INTRAMUSCULAR | 0 refills | Status: CP
Start: 2023-01-03 — End: ?
  Administered 2023-01-03: 19:00:00 3 mL via INTRAMUSCULAR

## 2023-01-03 MED ORDER — IOHEXOL 240 MG IODINE/ML IV SOLN
1 mL | Freq: Once | EPIDURAL | 0 refills | Status: CP
Start: 2023-01-03 — End: ?
  Administered 2023-01-03: 19:00:00 1 mL via EPIDURAL

## 2023-01-03 NOTE — Patient Instructions
Procedure Completed Today: Cervical Epidural Steroid Injection    Important information following your procedure today: You may drive today    Pain relief may not be immediate. It is possible you may even experience an increase in pain during the first 24-48 hours followed by a gradual decrease of your pain.  Though the procedure is generally safe and complications are rare, we do ask that you be aware of any of the following:   Any swelling, persistent redness, new bleeding, or drainage from the site of the injection.  You should not experience a severe headache.  You should not run a fever over 101? F.  New onset of sharp, severe back & or neck pain.  New onset of upper or lower extremity numbness or weakness.  New difficulty controlling bowel or bladder function after the injection.  New shortness of breath.    If any of these occur, please call to report this occurrence to Dr. Lourdes Sledge at [phone]. If you are calling after 4:00 p.m., on weekends or holidays please call [phone] and ask to have the resident physician on call for the physician paged or go to your local emergency room.  You may experience soreness at the injection site. Ice can be applied at 20 minute intervals. Avoid application of direct heat, hot showers or hot tubs today.  Avoid strenuous activity today. You may resume your regular activities and exercise tomorrow.  Patients with diabetes may see an elevation in blood sugars for 7-10 days after the injection. It is important to pay close attention to your diet, check your blood sugars daily and report extreme elevations to the physician that treats your diabetes.  Patients taking a daily blood thinner can resume their regular dose this evening.  It is important that you take all medications ordered by your pain physician. Taking medication as ordered is an important part of your pain care plan. If you cannot continue the medication plan, please notify the physician.     Possible side effects to steroids that may occur:  Flushing or redness of the face  Irritability  Fluid retention  Change in women?s menses    The following medications were used: Lidocaine , Triamcinolone  , and Contrast Dye

## 2023-01-03 NOTE — Progress Notes

## 2023-01-03 NOTE — Progress Notes
[facility]  INTERVENTIONAL PAIN PROCEDURE HISTORY AND PHYSICAL    Chief Complaint: Pain    HISTORY OF PRESENT ILLNESS:    Patient returns today for interventional treatment of neck and arm pain. Patient denies any recent fevers, chills, infection, antibiotics, coagulopathy or contraindicated anticoagulants. Risks of the procedure were discussed including but not limited to bleeding, infection, damage to surrounding structures and reaction to medications. Patient reports understanding and has elected to proceed with the procedure.      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    Social History     Socioeconomic History    Marital status: Married    Highest education level: Some college, no degree   Tobacco Use    Smoking status: Never    Smokeless tobacco: Never   Vaping Use    Vaping status: Never Used   Substance and Sexual Activity    Alcohol use: Never    Drug use: Never    Sexual activity: Yes     Partners: Male     Birth control/protection: None       Allergies   Allergen Reactions    Isometheptene CHEST TIGHTNESS, CHILLS and MUSCLE PAIN    Isopto Atropine [Atropine] WHEEZING    Sulfa (Sulfonamide Antibiotics) UNKNOWN       There were no vitals filed for this visit.       REVIEW OF SYSTEMS: 10 point ROS obtained and negative except as above.      PHYSICAL EXAM:  General: Alert, cooperative, no acute distress.  HEENT: Normocephalic, atraumatic.  Neck: Supple.  Lungs: Unlabored respirations, bilateral and equal chest excursion.  Heart: Regular rate.  Skin: Warm and dry to touch.  Abdomen: Nondistended.  MSK: No deformity  Neurological: Alert and oriented x3.        IMPRESSION:    1. Radiculopathy of cervicothoracic region         PLAN:  CESI C7-T1    This patient's clinical history, exam, AND imaging support radiculopathy AND there is a significant impact on quality of life and function AND the pain has been present for at least 4 weeks AND they have failed to improve with noninvasive conservative care.     This patient had at least 50% pain relief for at least 3 months with the last epidural injection. The pain score was 8/10 prior to the injection and 3/10 following the injection.    Julien Girt, MD  Pain Fellow, PGY-5

## 2023-01-03 NOTE — Procedures
Attending Surgeon: Evelina Bucy, MD    Anesthesia: Local    Pre-Procedure Diagnosis:   1. Radiculopathy of cervicothoracic region    2. Cervical radiculopathy    3. DDD (degenerative disc disease), cervical        Post-Procedure Diagnosis:   1. Radiculopathy of cervicothoracic region    2. Cervical radiculopathy    3. DDD (degenerative disc disease), cervical        [place] [facility] INJECT INTERLAM CRV/THRC  Procedure: epidural - interlaminar    Laterality: n/a   on 01/03/2023 12:30 PM  Location: cervical ESI with imaging - C7-T1      Consent:   Consent obtained: written  Consent given by: patient  Risks discussed: allergic reaction, bleeding, bruising, infection, never damage, no change or worsening in pain, pneumo thorax, reaction to medication, seizure, swelling and weakness  Alternatives discussed: alternative treatment, delayed treatment and no treatment  Discussed with patient the purpose of the treatment/procedure, other ways of treating my condition, including no treatment/ procedure and the risks and benefits of the alternatives. Patient has decided to proceed with treatment/procedure.        Universal Protocol:  Relevant documents: relevant documents present and verified  Site marked: the operative site was marked  Patient identity confirmed: Patient identify confirmed verbally with patient.        Time out: Immediately prior to procedure a time out was called to verify the correct patient, procedure, equipment, support staff and site/side marked as required      Procedures Details:   Indications: pain   Prep: chlorhexidine  Patient position: prone  Estimated Blood Loss: minimal  Specimens: none  Number of Levels: 1  Approach: midline  Guidance: fluoroscopy  Contrast: Procedure confirmed with contrast under live fluoroscopy.  Needle and Epidural Catheter: tuohy  Needle size: 18 G  Injection procedure: Incremental injection and Negative aspiration for blood  Amount Injected:   C7-T1: 4mL  Patient tolerance: Patient tolerated the procedure well with no immediate complications. Pressure was applied, and hemostasis was accomplished.  Outcome: Pain unchanged  Comments:   CERVICAL INTERLAMINAR EPIDURAL STEROID INJECTION    PROCEDURE:  1) C7-T1 interlaminar epidural steroid injection  2) Fluoroscopic needle guidance    REASON FOR PROCEDURE: Cervical radiculopathy, DDD (cervical)    ATTENDING PHYSICIAN: Evelina Bucy, MD    MEDICATIONS INJECTED: 2 mL of triamciniolone (80 mg) and 2 mL of sterile, preservative-free normal saline    LOCAL ANESTHETIC INJECTED: 1 mL of 1% lidocaine    SEDATION MEDICATIONS: None    ESTIMATED BLOOD LOSS: None    SPECIMENS REMOVED: None    COMPLICATIONS: None    TECHNIQUE: Time-out was taken to identify the correct patient, procedure and side prior to starting the procedure. With the patient lying in a prone position with the neck in a slightly  flexed position, the area was prepped and draped in sterile fashion using DuraPrep and a fenestrated drape. The area was determined under fluoroscopic guidance. A 27-gauge, 1.25-inch  needle was used to anesthetize the needle entry site and subcutaneous tissues.     The 18-gauge, 3.5-inch Tuohy needle was advanced through the ligamentum flavum using loss of resistance technique. Once the tip of the needle was thought to be in the desired position, contrast was injected to confirm only epidural spread and no vascular runoff via A-P and lateral  views. The injectate was then injected slowly with intermittent negative aspiration.    The procedure was completed without complications and was  tolerated well. The patient was monitored after the procedure. The patient (or responsible party) was given post-procedure and  discharge instructions to follow at home. The patient was discharged in stable condition.    Evelina Bucy, MD, MBA      Estimated blood loss: none or minimal  Specimens: none  Patient tolerated the procedure well with no immediate complications. Pressure was applied, and hemostasis was accomplished.  Administrations This Visit       iohexoL (OMNIPAQUE-240) 240 mg/mL injection 1 mL       Admin Date  01/03/2023 Action  Given Dose  1 mL Route  Epidural Documented By  Gladstone Lighter, RN              sodium chloride PF 0.9% injection 3 mL       Admin Date  01/03/2023 Action  Given Dose  3 mL Route  Injection Documented By  Gladstone Lighter, RN              triamcinolone acetonide [facility]) injection 80 mg       Admin Date  01/03/2023 Action  Given Dose  80 mg Route  Epidural Documented By  Gladstone Lighter, RN

## 2023-01-24 ENCOUNTER — Encounter: Admit: 2023-01-24 | Discharge: 2023-01-24 | Payer: MEDICARE

## 2023-01-26 ENCOUNTER — Encounter: Admit: 2023-01-26 | Discharge: 2023-01-26 | Payer: MEDICARE

## 2023-02-19 ENCOUNTER — Encounter: Admit: 2023-02-19 | Discharge: 2023-02-19 | Payer: MEDICARE

## 2023-02-24 ENCOUNTER — Ambulatory Visit: Admit: 2023-02-24 | Discharge: 2023-02-24 | Payer: MEDICARE

## 2023-02-24 ENCOUNTER — Encounter: Admit: 2023-02-24 | Discharge: 2023-02-24 | Payer: MEDICARE

## 2023-02-24 DIAGNOSIS — M7918 Myalgia, other site: Secondary | ICD-10-CM

## 2023-02-24 MED ORDER — BUPIVACAINE (PF) 0.25 % (2.5 MG/ML) IJ SOLN
3 mL | Freq: Once | INTRAMUSCULAR | 0 refills | Status: CP | PRN
Start: 2023-02-24 — End: ?

## 2023-02-24 MED ORDER — LIDOCAINE (PF) 20 MG/ML (2 %) IJ SOLN
3 mL | Freq: Once | INTRAMUSCULAR | 0 refills | Status: CP | PRN
Start: 2023-02-24 — End: ?

## 2023-02-24 NOTE — Procedures
Attending Surgeon: Evelina Bucy, MD    Anesthesia: Local    Pre-Procedure Diagnosis:   1. Myalgia, other site        Post-Procedure Diagnosis:   1. Myalgia, other site        [place] [facility] TRIGGER POINT INJECTION  Locations: L upper trapezius, R upper trapezius, R cervical and L cervical    Consent:   Consent obtained: written  Consent given by: patient  Alternatives discussed: alternative treatment, delayed treatment and no treatment  Discussed with patient the purpose of the treatment/procedure, other ways of treating my condition, including no treatment/ procedure and the risks and benefits of the alternatives. Patient has decided to proceed with treatment/procedure.        Universal Protocol:  Relevant documents: relevant documents present and verified  Site marked: the operative site was marked  Patient identity confirmed: Patient identify confirmed verbally with patient.        Time out: Immediately prior to procedure a time out was called to verify the correct patient, procedure, equipment, support staff and site/side marked as required      Procedures Details:   The trigger point is being used to treat myofascial pain caused by trigger points.  There is a focal area of pain in the skeletal muscle.  There is clinical evidence of a trigger point defined as pain in a skeletal muscle that is associated with at least 2 of the following findings: the presence of a hyperirritable spot and/or taut band identified by palpation and possible referred pain AND The physical examination identifies a focal hypersensitive bundle or nodule of muscle fiber harder than normal consistency with or without a local twitch response and referred pain.    Repeat trigger point injection in previously injected trigger points is medically necessary to treat myofascial pain syndrome due to a positive pain response from the most recent TPI defined as providing consistent minimum of 50% relief of primary (index) pain after the TPI AND Consistent pain relief from the most recent previous TPI lasting at least 6 weeks AND The myofascial pain has reoccurred and is causing objective functional limitations measured by a functional scale obtained at baseline and  after TPI which demonstrated at least 50% improvement from the previous TPI.    Indications: Non-invasive conservative therapy is not successful as first line treatment    Conservative Therapies attempted -  Physical Therapy  Prep: alcohol  Needle size: 27 G  Number of muscles: 3 or more    Approach: posterior    Patient tolerance: Patient tolerated the procedure well with no immediate complications. Pressure was applied, and hemostasis was accomplished.  Comments:   PROCEDURE: Trigger Point Injections    Pre-Procedure Diagnoses:  1. Myalgia, other  2. Myofascial pain  3. Muscle spasm    Post-Procedure Diagnoses:  Same    Physician: Evelina Bucy, MD    ANESTHESIA: 50:50 solution of Bupivacaine 0.25% and lidocaine 2%    COMPLICATIONS: None.    Location(s) of pain: As noted above  Presence of point tenderness in a tight band of muscle in above area: Yes  Restricted range of motion: Yes  Conservative therapy attempted for at least 6 weeks:  activity modification, pharmacotherapy  Other components of comprehensive management: pharmacotherapy, activity modification    DESCRIPTION OF PROCEDURE: The procedure risks, hazards and alternatives were discussed with the patient and a proper consent was obtained. The area over each trigger point was prepped with alcohol utilizing sterile technique. After isolating it  between two palpating fingertips a 27-gauge 1.5 needle was placed in the center of the myofascial spasms and a negative aspiration was performed. Then 0.5cc of the solution above was injected into each trigger point.     Trigger points in each of the muscle groups noted above were injected.     The patient tolerated the procedure well without any apparent difficulties or complications.    Medication Administered - 3 mL bupivacaine PF 0.25 %; 3 mL lidocaine (PF) 20 mg/mL (2 %)  Pre Procedure Pain: 5  Post Procedure Pain: 2      Estimated blood loss: none or minimal  Specimens: none  Patient tolerated the procedure well with no immediate complications. Pressure was applied, and hemostasis was accomplished.

## 2023-03-28 ENCOUNTER — Encounter: Admit: 2023-03-28 | Discharge: 2023-03-28 | Payer: MEDICARE

## 2023-03-29 ENCOUNTER — Encounter: Admit: 2023-03-29 | Discharge: 2023-03-29 | Payer: MEDICARE

## 2023-03-30 ENCOUNTER — Encounter: Admit: 2023-03-30 | Discharge: 2023-03-30 | Payer: MEDICARE

## 2023-04-04 ENCOUNTER — Encounter: Admit: 2023-04-04 | Discharge: 2023-04-04 | Payer: MEDICARE

## 2023-04-04 ENCOUNTER — Ambulatory Visit: Admit: 2023-04-04 | Discharge: 2023-04-05 | Payer: MEDICARE

## 2023-04-04 DIAGNOSIS — M5413 Radiculopathy, cervicothoracic region: Secondary | ICD-10-CM

## 2023-04-04 DIAGNOSIS — M5412 Radiculopathy, cervical region: Secondary | ICD-10-CM

## 2023-04-04 DIAGNOSIS — M5030 Other cervical disc degeneration, unspecified cervical region: Secondary | ICD-10-CM

## 2023-04-04 MED ORDER — IOHEXOL 240 MG IODINE/ML IV SOLN
1 mL | Freq: Once | EPIDURAL | 0 refills | Status: CP
Start: 2023-04-04 — End: ?
  Administered 2023-04-04: 19:00:00 1 mL via EPIDURAL

## 2023-04-04 MED ORDER — TRIAMCINOLONE ACETONIDE 40 MG/ML IJ SUSP
80 mg | Freq: Once | EPIDURAL | 0 refills | Status: CP
Start: 2023-04-04 — End: ?
  Administered 2023-04-04: 19:00:00 80 mg via EPIDURAL

## 2023-04-04 NOTE — Progress Notes

## 2023-04-04 NOTE — Procedures
 Attending Surgeon: Evelina Bucy, MD    Anesthesia: Local    Pre-Procedure Diagnosis:   1. Cervical radiculopathy    2. DDD (degenerative disc disease), cervical    3. Radiculopathy of cervicothoracic region        Post-Procedure Diagnosis:   1. Cervical radiculopathy    2. DDD (degenerative disc disease), cervical    3. Radiculopathy of cervicothoracic region        [facility] INJECT INTERLAM CRV/THRC  Procedure: epidural - interlaminar    Laterality: n/a   on 04/04/2023 12:30 PM  Location: cervical ESI with imaging - C7-T1      Consent:   Consent obtained: written  Consent given by: patient  Risks discussed: allergic reaction, bleeding, bruising, infection, never damage, no change or worsening in pain, pneumo thorax, reaction to medication, seizure, swelling and weakness  Alternatives discussed: alternative treatment, delayed treatment and no treatment  Discussed with patient the purpose of the treatment/procedure, other ways of treating my condition, including no treatment/ procedure and the risks and benefits of the alternatives. Patient has decided to proceed with treatment/procedure.        Universal Protocol:  Relevant documents: relevant documents present and verified  Site marked: the operative site was marked  Patient identity confirmed: Patient identify confirmed verbally with patient.        Time out: Immediately prior to procedure a time out was called to verify the correct patient, procedure, equipment, support staff and site/side marked as required      Procedures Details:   Indications: pain   Prep: chlorhexidine  Patient position: prone  Estimated Blood Loss: minimal  Specimens: none  Number of Levels: 1  Approach: midline  Guidance: fluoroscopy  Contrast: Procedure confirmed with contrast under live fluoroscopy.  Needle and Epidural Catheter: tuohy  Needle size: 18 G  Injection procedure: Incremental injection and Negative aspiration for blood  Amount Injected:   C7-T1: 4mL  Patient tolerance: Patient tolerated the procedure well with no immediate complications. Pressure was applied, and hemostasis was accomplished.  Outcome: Pain unchanged  Comments:   CERVICAL INTERLAMINAR EPIDURAL STEROID INJECTION    PROCEDURE:  1) C7-T1 interlaminar epidural steroid injection  2) Fluoroscopic needle guidance    REASON FOR PROCEDURE: Cervical radiculopathy, DDD (cervical)    ATTENDING PHYSICIAN: Evelina Bucy, MD    MEDICATIONS INJECTED: 2 mL of triamciniolone (80 mg) and 2 mL of sterile, preservative-free normal saline    LOCAL ANESTHETIC INJECTED: 1 mL of 1% lidocaine    SEDATION MEDICATIONS: None    ESTIMATED BLOOD LOSS: None    SPECIMENS REMOVED: None    COMPLICATIONS: None    TECHNIQUE: Time-out was taken to identify the correct patient, procedure and side prior to starting the procedure. With the patient lying in a prone position with the neck in a slightly  flexed position, the area was prepped and draped in sterile fashion using DuraPrep and a fenestrated drape. The area was determined under fluoroscopic guidance. A 27-gauge, 1.25-inch  needle was used to anesthetize the needle entry site and subcutaneous tissues.     The 18-gauge, 3.5-inch Tuohy needle was advanced through the ligamentum flavum using loss of resistance technique. Once the tip of the needle was thought to be in the desired position, contrast was injected to confirm only epidural spread and no vascular runoff via A-P and lateral  views. The injectate was then injected slowly with intermittent negative aspiration.    The procedure was completed without complications and was  tolerated well. The patient was monitored after the procedure. The patient (or responsible party) was given post-procedure and  discharge instructions to follow at home. The patient was discharged in stable condition.    Evelina Bucy, MD, MBA      Estimated blood loss: none or minimal  Specimens: none  Patient tolerated the procedure well with no immediate complications. Pressure was applied, and hemostasis was accomplished.  Administrations This Visit       iohexoL (OMNIPAQUE-240) 240 mg/mL injection 1 mL       Admin Date  04/04/2023 Action  Given Dose  1 mL Route  Epidural Documented By  Gates Rigg, RN              triamcinolone acetonide [facility]) injection 80 mg       Admin Date  04/04/2023 Action  Given Dose  80 mg Route  Epidural Documented By  Gates Rigg, RN

## 2023-04-04 NOTE — Progress Notes
 [facility]  INTERVENTIONAL PAIN PROCEDURE HISTORY AND PHYSICAL    Chief Complaint: Pain    HISTORY OF PRESENT ILLNESS:    Patient returns today for interventional treatment of neck and arm pain. Patient denies any recent fevers, chills, infection, antibiotics, coagulopathy or contraindicated anticoagulants. Risks of the procedure were discussed including but not limited to bleeding, infection, damage to surrounding structures and reaction to medications. Patient reports understanding and has elected to proceed with the procedure.      Past Medical History:    Cataract    Generalized headaches    Glaucoma    Migraines       Surgical History:   Procedure Laterality Date    HX BREAST BIOPSY Right 10/01/2004    Benign    HX BREAST BIOPSY Right 04/07/2015    Benign    ANKLE SURGERY      HEART CATHETERIZATION      TUBAL LIGATION         family history includes Alcohol liver disease in her brother; Arthritis in her brother, father, and mother; Cancer in her brother and father; Cancer-Breast in her niece; Cancer-Prostate in her father; Cataract in her mother; Glaucoma in her mother; Joint Pain in her father.    Social History     Socioeconomic History    Marital status: Married    Highest education level: Some college, no degree   Tobacco Use    Smoking status: Never    Smokeless tobacco: Never   Vaping Use    Vaping status: Never Used   Substance and Sexual Activity    Alcohol use: Never    Drug use: Never    Sexual activity: Yes     Partners: Male     Birth control/protection: None       Allergies   Allergen Reactions    Isometheptene CHEST TIGHTNESS, CHILLS and MUSCLE PAIN    Isopto Atropine [Atropine] WHEEZING    Sulfa (Sulfonamide Antibiotics) UNKNOWN       There were no vitals filed for this visit.       REVIEW OF SYSTEMS: 10 point ROS obtained and negative except as above.      PHYSICAL EXAM:  General: Alert, cooperative, no acute distress.  HEENT: Normocephalic, atraumatic.  Neck: Supple.  Lungs: Unlabored respirations, bilateral and equal chest excursion.  Heart: Regular rate.  Skin: Warm and dry to touch.  Abdomen: Nondistended.  MSK: No deformity  Neurological: Alert and oriented x3.        IMPRESSION:    1. Cervical radiculopathy    2. DDD (degenerative disc disease), cervical    3. Radiculopathy of cervicothoracic region         PLAN:  CESI C7-T1    This patient's clinical history, exam, AND imaging support radiculopathy AND there is a significant impact on quality of life and function AND the pain has been present for at least 4 weeks AND they have failed to improve with noninvasive conservative care.     This patient had at least 50% pain relief for at least 3 months with the last epidural injection.    Julien Girt, MD  Pain Fellow, PGY-5

## 2023-04-04 NOTE — Patient Instructions
Procedure Completed Today: Cervical Epidural Steroid Injection    Important information following your procedure today: You may drive today    Pain relief may not be immediate. It is possible you may even experience an increase in pain during the first 24-48 hours followed by a gradual decrease of your pain.  Though the procedure is generally safe and complications are rare, we do ask that you be aware of any of the following:   Any swelling, persistent redness, new bleeding, or drainage from the site of the injection.  You should not experience a severe headache.  You should not run a fever over 101? F.  New onset of sharp, severe back & or neck pain.  New onset of upper or lower extremity numbness or weakness.  New difficulty controlling bowel or bladder function after the injection.  New shortness of breath.    If any of these occur, please call to report this occurrence to Dr. Lourdes Sledge at [phone]. If you are calling after 4:00 p.m., on weekends or holidays please call [phone] and ask to have the resident physician on call for the physician paged or go to your local emergency room.  You may experience soreness at the injection site. Ice can be applied at 20 minute intervals. Avoid application of direct heat, hot showers or hot tubs today.  Avoid strenuous activity today. You may resume your regular activities and exercise tomorrow.  Patients with diabetes may see an elevation in blood sugars for 7-10 days after the injection. It is important to pay close attention to your diet, check your blood sugars daily and report extreme elevations to the physician that treats your diabetes.  Patients taking a daily blood thinner can resume their regular dose this evening.  It is important that you take all medications ordered by your pain physician. Taking medication as ordered is an important part of your pain care plan. If you cannot continue the medication plan, please notify the physician.     Possible side effects to steroids that may occur:  Flushing or redness of the face  Irritability  Fluid retention  Change in women?s menses    The following medications were used: Lidocaine , Triamcinolone  , and Contrast Dye

## 2023-06-27 ENCOUNTER — Encounter: Admit: 2023-06-27 | Discharge: 2023-06-27 | Payer: MEDICARE

## 2023-06-27 DIAGNOSIS — M5412 Radiculopathy, cervical region: Secondary | ICD-10-CM

## 2023-07-01 ENCOUNTER — Encounter: Admit: 2023-07-01 | Discharge: 2023-07-01 | Payer: MEDICARE

## 2023-07-04 ENCOUNTER — Encounter: Admit: 2023-07-04 | Discharge: 2023-07-04 | Payer: MEDICARE

## 2023-07-04 ENCOUNTER — Ambulatory Visit: Admit: 2023-07-04 | Discharge: 2023-07-05 | Payer: MEDICARE

## 2023-07-04 ENCOUNTER — Ambulatory Visit: Admit: 2023-07-04 | Discharge: 2023-07-04 | Payer: MEDICARE

## 2023-07-04 DIAGNOSIS — M7918 Myalgia, other site: Secondary | ICD-10-CM

## 2023-07-04 DIAGNOSIS — M5412 Radiculopathy, cervical region: Secondary | ICD-10-CM

## 2023-07-04 MED ORDER — TRIAMCINOLONE ACETONIDE 40 MG/ML IJ SUSP
80 mg | Freq: Once | EPIDURAL | 0 refills | Status: CP
Start: 2023-07-04 — End: ?
  Administered 2023-07-04: 18:00:00 80 mg via EPIDURAL

## 2023-07-04 MED ORDER — IOHEXOL 240 MG IODINE/ML IV SOLN
1 mL | Freq: Once | EPIDURAL | 0 refills | Status: CP
Start: 2023-07-04 — End: ?
  Administered 2023-07-04: 18:00:00 1 mL via EPIDURAL

## 2023-07-04 NOTE — Progress Notes
 [facility]  INTERVENTIONAL PAIN PROCEDURE HISTORY AND PHYSICAL    Chief Complaint: Pain    HISTORY OF PRESENT ILLNESS:    Patient returns today for interventional treatment of neck and arm pain. Patient denies any recent fevers, chills, infection, antibiotics, coagulopathy or contraindicated anticoagulants. Risks of the procedure were discussed including but not limited to bleeding, infection, damage to surrounding structures and reaction to medications. Patient reports understanding and has elected to proceed with the procedure.      Past Medical History:    Cataract    Essential hypertension    Essential hypertension, benign    Generalized headaches    Glaucoma    Migraines       Surgical History:   Procedure Laterality Date    HX BREAST BIOPSY Right 10/01/2004    Benign    HX BREAST BIOPSY Right 04/07/2015    Benign    ANKLE SURGERY      HEART CATHETERIZATION      TUBAL LIGATION         family history includes Alcohol liver disease in Lorel's brother; Arthritis in Bernedette's brother, father, and mother; Cancer in Channon's brother and father; Physiological scientist in [place] niece; Cancer-Prostate in Sandralee's father; Cataract in David's mother; Glaucoma in Serra's mother; Joint Pain in Lulia's father.    Social History     Socioeconomic History    Marital status: Married    Highest education level: Some college, no degree   Tobacco Use    Smoking status: Never    Smokeless tobacco: Never   Vaping Use    Vaping status: Never Used   Substance and Sexual Activity    Alcohol use: Never    Drug use: Never    Sexual activity: Yes     Partners: Male     Birth control/protection: None       Allergies   Allergen Reactions    Isometheptene CHEST TIGHTNESS, CHILLS and MUSCLE PAIN    Isopto Atropine [Atropine] WHEEZING    Sulfa (Sulfonamide Antibiotics) UNKNOWN       There were no vitals filed for this visit.       REVIEW OF SYSTEMS: 10 point ROS obtained and negative except as above.      PHYSICAL EXAM:  General: Alert, cooperative, no acute distress.  HEENT: Normocephalic, atraumatic.  Neck: Supple.  Lungs: Unlabored respirations, bilateral and equal chest excursion.  Heart: Regular rate.  Skin: Warm and dry to touch.  Abdomen: Nondistended.  MSK: No deformity  Neurological: Alert and oriented x3.        IMPRESSION:    1. Cervical radiculopathy         PLAN:  CESI C7-T1    This patient's clinical history, exam, AND imaging support radiculopathy AND there is a significant impact on quality of life and function AND the pain has been present for at least 4 weeks AND they have failed to improve with noninvasive conservative care.     This patient had at least 50% pain relief for at least 3 months with the last epidural injection.

## 2023-07-04 NOTE — Procedures
 Attending Surgeon: Bayard Bottcher, MD    Anesthesia: Local      [facility] Nonie Beady CRV/THRC  Procedure: epidural - interlaminar    Laterality: n/a   on 07/04/2023 12:30 PM  Location: cervical ESI with imaging - C7-T1      Consent:   Consent obtained: written  Consent given by: patient  Risks discussed: allergic reaction, bleeding, bruising, infection, never damage, no change or worsening in pain, pneumo thorax, reaction to medication, seizure, swelling and weakness  Alternatives discussed: alternative treatment, delayed treatment and no treatment  Discussed with patient the purpose of the treatment/procedure, other ways of treating my condition, including no treatment/ procedure and the risks and benefits of the alternatives. Patient has decided to proceed with treatment/procedure.        Universal Protocol:  Relevant documents: relevant documents present and verified  Site marked: the operative site was marked  Patient identity confirmed: Patient identify confirmed verbally with patient.        Time out: Immediately prior to procedure a time out was called to verify the correct patient, procedure, equipment, support staff and site/side marked as required      Procedures Details:   Indications: pain   Prep: chlorhexidine  Patient position: prone  Estimated Blood Loss: minimal  Specimens: none  Number of Levels: 1  Approach: midline  Guidance: fluoroscopy  Contrast: Procedure confirmed with contrast under live fluoroscopy.  Needle and Epidural Catheter: tuohy  Needle size: 18 G  Injection procedure: Incremental injection and Negative aspiration for blood  Amount Injected:   C7-T1: 4mL  Patient tolerance: Patient tolerated the procedure well with no immediate complications. Pressure was applied, and hemostasis was accomplished.  Outcome: Pain unchanged  Comments:   CERVICAL INTERLAMINAR EPIDURAL STEROID INJECTION    PROCEDURE:  1) C7-T1 interlaminar epidural steroid injection  2) Fluoroscopic needle guidance    REASON FOR PROCEDURE: Cervical radiculopathy, DDD (cervical)    ATTENDING PHYSICIAN: Bayard Bottcher, MD    MEDICATIONS INJECTED: 2 mL of triamciniolone (80 mg) and 2 mL of sterile, preservative-free normal saline    LOCAL ANESTHETIC INJECTED: 1 mL of 1% lidocaine    SEDATION MEDICATIONS: None    ESTIMATED BLOOD LOSS: None    SPECIMENS REMOVED: None    COMPLICATIONS: None    TECHNIQUE: Time-out was taken to identify the correct patient, procedure and side prior to starting the procedure. With the patient lying in a prone position with the neck in a slightly  flexed position, the area was prepped and draped in sterile fashion using DuraPrep and a fenestrated drape. The area was determined under fluoroscopic guidance. A 27-gauge, 1.25-inch  needle was used to anesthetize the needle entry site and subcutaneous tissues.     The 18-gauge, 3.5-inch Tuohy needle was advanced through the ligamentum flavum using loss of resistance technique. Once the tip of the needle was thought to be in the desired position, contrast was injected to confirm only epidural spread and no vascular runoff via A-P and lateral  views. The injectate was then injected slowly with intermittent negative aspiration.    The procedure was completed without complications and was tolerated well. The patient was monitored after the procedure. The patient (or responsible party) was given post-procedure and  discharge instructions to follow at home. The patient was discharged in stable condition.    Bayard Bottcher, MD, MBA      Estimated blood loss: none or minimal  Specimens: none  Patient tolerated the procedure well with  no immediate complications. Pressure was applied, and hemostasis was accomplished.  Administrations This Visit       iohexoL (OMNIPAQUE-240) 240 mg/mL injection 1 mL       Admin Date  07/04/2023 Action  Given Dose  1 mL Route  Epidural Documented By  Ericka Hauser, RN              triamcinolone acetonide [facility]) injection 80 mg Admin Date  07/04/2023 Action  Given Dose  80 mg Route  Epidural Documented By  Ericka Hauser, RN

## 2023-07-04 NOTE — Patient Instructions
Procedure Completed Today: Cervical Epidural Steroid Injection    Important information following your procedure today: You may drive today    Pain relief may not be immediate. It is possible you may even experience an increase in pain during the first 24-48 hours followed by a gradual decrease of your pain.  Though the procedure is generally safe and complications are rare, we do ask that you be aware of any of the following:   Any swelling, persistent redness, new bleeding, or drainage from the site of the injection.  You should not experience a severe headache.  You should not run a fever over 101? F.  New onset of sharp, severe back & or neck pain.  New onset of upper or lower extremity numbness or weakness.  New difficulty controlling bowel or bladder function after the injection.  New shortness of breath.    If any of these occur, please call to report this occurrence to Dr. Lourdes Sledge at [phone]. If you are calling after 4:00 p.m., on weekends or holidays please call [phone] and ask to have the resident physician on call for the physician paged or go to your local emergency room.  You may experience soreness at the injection site. Ice can be applied at 20 minute intervals. Avoid application of direct heat, hot showers or hot tubs today.  Avoid strenuous activity today. You may resume your regular activities and exercise tomorrow.  Patients with diabetes may see an elevation in blood sugars for 7-10 days after the injection. It is important to pay close attention to your diet, check your blood sugars daily and report extreme elevations to the physician that treats your diabetes.  Patients taking a daily blood thinner can resume their regular dose this evening.  It is important that you take all medications ordered by your pain physician. Taking medication as ordered is an important part of your pain care plan. If you cannot continue the medication plan, please notify the physician.     Possible side effects to steroids that may occur:  Flushing or redness of the face  Irritability  Fluid retention  Change in women?s menses    The following medications were used: Lidocaine , Triamcinolone  , and Contrast Dye

## 2023-07-04 NOTE — Progress Notes

## 2023-07-10 ENCOUNTER — Encounter: Admit: 2023-07-10 | Discharge: 2023-07-10 | Payer: MEDICARE

## 2023-07-11 ENCOUNTER — Encounter: Admit: 2023-07-11 | Discharge: 2023-07-11 | Payer: MEDICARE

## 2023-07-11 ENCOUNTER — Ambulatory Visit: Admit: 2023-07-11 | Discharge: 2023-07-12 | Payer: MEDICARE

## 2023-08-04 ENCOUNTER — Encounter: Admit: 2023-08-04 | Discharge: 2023-08-04 | Payer: MEDICARE

## 2023-10-03 ENCOUNTER — Encounter: Admit: 2023-10-03 | Discharge: 2023-10-03 | Payer: MEDICARE

## 2023-10-03 DIAGNOSIS — M5030 Other cervical disc degeneration, unspecified cervical region: Secondary | ICD-10-CM

## 2023-10-03 DIAGNOSIS — M5412 Radiculopathy, cervical region: Principal | ICD-10-CM

## 2023-10-03 DIAGNOSIS — M5413 Radiculopathy, cervicothoracic region: Secondary | ICD-10-CM

## 2023-10-08 ENCOUNTER — Encounter: Admit: 2023-10-08 | Discharge: 2023-10-08 | Payer: MEDICARE

## 2023-10-10 ENCOUNTER — Ambulatory Visit: Admit: 2023-10-10 | Discharge: 2023-10-10 | Payer: MEDICARE

## 2023-10-10 ENCOUNTER — Ambulatory Visit: Admit: 2023-10-10 | Discharge: 2023-10-11 | Payer: MEDICARE

## 2023-10-10 ENCOUNTER — Encounter: Admit: 2023-10-10 | Discharge: 2023-10-10 | Payer: MEDICARE

## 2023-10-10 DIAGNOSIS — M5412 Radiculopathy, cervical region: Principal | ICD-10-CM

## 2023-10-10 MED ORDER — IOHEXOL 240 MG IODINE/ML IV SOLN
1 mL | Freq: Once | EPIDURAL | 0 refills | Status: CP
Start: 2023-10-10 — End: ?
  Administered 2023-10-10: 17:00:00 1 mL via EPIDURAL

## 2023-10-10 MED ORDER — TRIAMCINOLONE ACETONIDE 40 MG/ML IJ SUSP
80 mg | Freq: Once | EPIDURAL | 0 refills | Status: CP
Start: 2023-10-10 — End: ?
  Administered 2023-10-10: 17:00:00 80 mg via EPIDURAL

## 2023-10-10 NOTE — Progress Notes
 [facility]  INTERVENTIONAL PAIN PROCEDURE HISTORY AND PHYSICAL    Chief Complaint: Pain    HISTORY OF PRESENT ILLNESS:  Neck pain with radicular arm pain    Past Medical History:    Cataract    Essential hypertension    Essential hypertension, benign    Generalized headaches    Glaucoma    Migraines       Surgical History:   Procedure Laterality Date    HX BREAST BIOPSY Right 10/01/2004    Benign    HX BREAST BIOPSY Right 04/07/2015    Benign    ANKLE SURGERY      HEART CATHETERIZATION      TUBAL LIGATION         family history includes Alcohol liver disease in Burgandy's brother; Arthritis in Kaydynce's brother, father, and mother; Cancer in Dyna's brother and father; Physiological scientist in [place] niece; Cancer-Prostate in Dwanda's father; Cataract in Manaia's mother; Glaucoma in Sameka's mother; Joint Pain in Angelene's father.    Social History     Socioeconomic History    Marital status: Married    Highest education level: Some college, no degree   Tobacco Use    Smoking status: Never    Smokeless tobacco: Never   Vaping Use    Vaping status: Never Used   Substance and Sexual Activity    Alcohol use: Never    Drug use: Never    Sexual activity: Yes     Partners: Male     Birth control/protection: None       Allergies[1]    There were no vitals filed for this visit.     Oswestry Total Score:: (Patient-Rptd) 4    REVIEW OF SYSTEMS: 10 point ROS obtained and negative      PHYSICAL EXAM:    General: Alert, cooperative, no acute distress.  HEENT: Normocephalic, atraumatic.  Neck: Supple.  Lungs: Unlabored respirations, bilateral and equal chest excursion.  Heart: Regular rate.  Skin: Warm and dry to touch.  Abdomen: Nondistended.  MSK: No deformity.  Neurological: Alert and oriented x3.        IMPRESSION:    1. Cervical radiculopathy         PLAN: C7-T1 Cervical Epidural Steroid Injection        [1]   Allergies  Allergen Reactions    Isometheptene CHEST TIGHTNESS, CHILLS and MUSCLE PAIN    Isopto Atropine [Atropine] WHEEZING    Sulfa (Sulfonamide Antibiotics) UNKNOWN

## 2023-10-10 NOTE — Procedures
 Attending Surgeon: Rupert Donovan, MD    Anesthesia: Local      [name]  Procedure: epidural - interlaminar    Laterality: n/a   on 10/10/2023 12:30 PM  Location: cervical ESI with imaging - C7-T1      Consent:   Consent obtained: written  Consent given by: patient  Risks discussed: allergic reaction, bleeding, bruising, infection, never damage, no change or worsening in pain, pneumo thorax, reaction to medication, seizure, swelling and weakness  Alternatives discussed: alternative treatment, delayed treatment and no treatment  Discussed with patient the purpose of the treatment/procedure, other ways of treating my condition, including no treatment/ procedure and the risks and benefits of the alternatives. Patient has decided to proceed with treatment/procedure.        Universal Protocol:  Relevant documents: relevant documents present and verified  Site marked: the operative site was marked  Patient identity confirmed: Patient identify confirmed verbally with patient.        Time out: Immediately prior to procedure a time out was called to verify the correct patient, procedure, equipment, support staff and site/side marked as required      Procedures Details:   Indications: pain   Prep: chlorhexidine  Patient position: prone  Estimated Blood Loss: minimal  Specimens: none  Number of Levels: 1  Approach: midline  Guidance: fluoroscopy  Contrast: Procedure confirmed with contrast under live fluoroscopy.  Needle and Epidural Catheter: tuohy  Needle size: 18 G  Injection procedure: Incremental injection and Negative aspiration for blood  Amount Injected:   C7-T1: 4mL  Patient tolerance: Patient tolerated the procedure well with no immediate complications. Pressure was applied, and hemostasis was accomplished.  Outcome: Pain unchanged  Comments:   CERVICAL INTERLAMINAR EPIDURAL STEROID INJECTION    PROCEDURE:  1) C7-T1 interlaminar epidural steroid injection  2) Fluoroscopic needle guidance    REASON FOR PROCEDURE: Cervical radiculopathy    ATTENDING PHYSICIAN: Rupert Donovan, MD    MEDICATIONS INJECTED: 2 mL of triamciniolone (80 mg) and 2 mL of sterile, preservative-free normal saline    LOCAL ANESTHETIC INJECTED: 1 mL of 1% lidocaine     SEDATION MEDICATIONS: None    ESTIMATED BLOOD LOSS: None    SPECIMENS REMOVED: None    COMPLICATIONS: None    TECHNIQUE: Time-out was taken to identify the correct patient, procedure and side prior to starting the procedure. With the patient lying in a prone position with the neck in a slightly  flexed position, the area was prepped and draped in sterile fashion using DuraPrep and a fenestrated drape. The area was determined under fluoroscopic guidance. A 27-gauge, 1.25-inch  needle was used to anesthetize the needle entry site and subcutaneous tissues.     The 18-gauge, 3.5-inch Tuohy needle was advanced through the ligamentum flavum using loss of resistance technique. Once the tip of the needle was thought to be in the desired position, contrast was injected to confirm only epidural spread and no vascular runoff via A-P and lateral  views. The injectate was then injected slowly with intermittent negative aspiration.    The procedure was completed without complications and was tolerated well. The patient was monitored after the procedure. The patient (or responsible party) was given post-procedure and  discharge instructions to follow at home. The patient was discharged in stable condition.    Rupert Donovan, MD, MBA        Estimated blood loss: none or minimal  Specimens: none  Patient tolerated the procedure well with  no immediate complications. Pressure was applied, and hemostasis was accomplished.  Administrations This Visit       iohexoL  (OMNIPAQUE -240) 240 mg/mL injection 1 mL       Admin Date  10/10/2023 Action  Given Dose  1 mL Route  Epidural Documented By  Zerr, Ethan, RN              triamcinolone  acetonide (KENALOG -40) injection 80 mg       Admin Date  10/10/2023 Action  Given Dose  80 mg Route  Epidural Documented By  Zerr, Ethan, RN

## 2023-10-10 NOTE — Progress Notes

## 2023-10-10 NOTE — Patient Instructions
 Procedure Completed Today: Cervical Epidural Steroid Injection    Important information following your procedure today: You may drive today    Pain relief may not be immediate. It is possible you may even experience an increase in pain during the first 24-48 hours followed by a gradual decrease of your pain.  Though the procedure is generally safe and complications are rare, we do ask that you be aware of any of the following:   Any swelling, persistent redness, new bleeding, or drainage from the site of the injection.  You should not experience a severe headache.  You should not run a fever over 101? F.  New onset of sharp, severe back & or neck pain.  New onset of upper or lower extremity numbness or weakness.  New difficulty controlling bowel or bladder function after the injection.  New shortness of breath.    If any of these occur, please call to report this occurrence to Dr. Lourdes Sledge at [phone]. If you are calling after 4:00 p.m., on weekends or holidays please call [phone] and ask to have the resident physician on call for the physician paged or go to your local emergency room.  You may experience soreness at the injection site. Ice can be applied at 20 minute intervals. Avoid application of direct heat, hot showers or hot tubs today.  Avoid strenuous activity today. You may resume your regular activities and exercise tomorrow.  Patients with diabetes may see an elevation in blood sugars for 7-10 days after the injection. It is important to pay close attention to your diet, check your blood sugars daily and report extreme elevations to the physician that treats your diabetes.  Patients taking a daily blood thinner can resume their regular dose this evening.  It is important that you take all medications ordered by your pain physician. Taking medication as ordered is an important part of your pain care plan. If you cannot continue the medication plan, please notify the physician.     Possible side effects to steroids that may occur:  Flushing or redness of the face  Irritability  Fluid retention  Change in women?s menses    The following medications were used: Triamcinolone  , Contrast Dye, and Normal Saline

## 2023-11-10 ENCOUNTER — Encounter: Admit: 2023-11-10 | Discharge: 2023-11-10 | Payer: MEDICARE

## 2023-11-14 ENCOUNTER — Encounter: Admit: 2023-11-14 | Discharge: 2023-11-14 | Payer: MEDICARE

## 2023-11-14 ENCOUNTER — Ambulatory Visit: Admit: 2023-11-14 | Discharge: 2023-11-15 | Payer: MEDICARE

## 2023-11-14 VITALS — BP 155/83 | HR 68 | Ht 64.0 in | Wt 96.4 lb

## 2023-11-14 DIAGNOSIS — M7918 Myalgia, other site: Principal | ICD-10-CM

## 2023-11-14 MED ORDER — BUPIVACAINE (PF) 0.25 % (2.5 MG/ML) IJ SOLN
3 mL | Freq: Once | INTRAMUSCULAR | 0 refills | Status: CP | PRN
Start: 2023-11-14 — End: ?

## 2023-11-14 MED ORDER — LIDOCAINE (PF) 20 MG/ML (2 %) IJ SOLN
3 mL | Freq: Once | INTRAMUSCULAR | 0 refills | Status: CP | PRN
Start: 2023-11-14 — End: ?

## 2024-01-09 ENCOUNTER — Encounter: Admit: 2024-01-09 | Discharge: 2024-01-09 | Payer: MEDICARE

## 2024-01-09 DIAGNOSIS — M5412 Radiculopathy, cervical region: Principal | ICD-10-CM

## 2024-01-09 DIAGNOSIS — M5413 Radiculopathy, cervicothoracic region: Secondary | ICD-10-CM

## 2024-01-10 ENCOUNTER — Encounter: Admit: 2024-01-10 | Discharge: 2024-01-10 | Payer: MEDICARE

## 2024-01-16 ENCOUNTER — Encounter: Admit: 2024-01-16 | Discharge: 2024-01-16 | Payer: MEDICARE

## 2024-01-16 ENCOUNTER — Ambulatory Visit: Admit: 2024-01-16 | Discharge: 2024-01-17 | Payer: MEDICARE

## 2024-01-16 ENCOUNTER — Ambulatory Visit: Admit: 2024-01-16 | Discharge: 2024-01-16 | Payer: MEDICARE

## 2024-01-18 ENCOUNTER — Encounter: Admit: 2024-01-18 | Discharge: 2024-01-18 | Payer: MEDICARE

## 2024-03-13 ENCOUNTER — Encounter: Admit: 2024-03-13 | Discharge: 2024-03-13 | Payer: MEDICARE

## 2024-03-14 ENCOUNTER — Encounter: Admit: 2024-03-14 | Discharge: 2024-03-14 | Payer: MEDICARE

## 2024-03-19 ENCOUNTER — Encounter: Admit: 2024-03-19 | Discharge: 2024-03-19 | Payer: MEDICARE

## 2024-03-19 ENCOUNTER — Ambulatory Visit: Admit: 2024-03-19 | Discharge: 2024-03-20 | Payer: MEDICARE

## 2024-03-19 VITALS — BP 151/76 | HR 71 | Ht 64.0 in | Wt 96.0 lb

## 2024-03-19 DIAGNOSIS — M7918 Myalgia, other site: Principal | ICD-10-CM

## 2024-03-19 MED ORDER — LIDOCAINE (PF) 20 MG/ML (2 %) IJ SOLN
5 mL | Freq: Once | INTRAMUSCULAR | 0 refills | Status: CP | PRN
Start: 2024-03-19 — End: ?

## 2024-03-19 MED ORDER — BUPIVACAINE (PF) 0.25 % (2.5 MG/ML) IJ SOLN
5 mL | Freq: Once | INTRAMUSCULAR | 0 refills | Status: CP | PRN
Start: 2024-03-19 — End: ?

## 2024-03-19 NOTE — Procedures [3]
 Attending Surgeon: Tonda KANDICE Meres, APRN-NP    Anesthesia: Local      [place] [facility] SPINE TRIGGER POINT INJECTION  Locations: L upper trapezius, R upper trapezius, R cervical, L cervical, L levator ani and R levator scapulae     Consent:   Consent obtained: written  Consent given by: patient  Alternatives discussed: alternative treatment, delayed treatment, no treatment and referral     Universal Protocol:  Relevant documents: relevant documents present and verified  Site marked: the operative site was marked  Patient identity confirmed: Patient identify confirmed verbally with patient.        Time out: Immediately prior to procedure a time out was called to verify the correct patient, procedure, equipment, support staff and site/side marked as required      Medications (Left): 5 mL bupivacaine  PF 0.25 %; 5 mL lidocaine  (PF) 20 mg/mL (2 %)  Procedures Details:     Repeat trigger point injection in previously injected trigger points is medically necessary to treat myofascial pain syndrome due to a positive pain response from the most recent TPI defined as providing consistent minimum of 50% relief of primary (index) pain after the TPI AND Consistent pain relief from the most recent previous TPI lasting at least 6 weeks AND The myofascial pain has reoccurred and is causing objective functional limitations measured by a functional scale obtained at baseline and  after TPI which demonstrated at least 50% improvement from the previous TPI.      Prep: alcohol  Needle size: 27 G  Number of muscles: 3 or more    Approach: superior    Patient tolerance: Patient tolerated the procedure well with no immediate complications. Pressure was applied, and hemostasis was accomplished.  Comments: PROCEDURE: Trigger Point Injections    Pre-Procedure Diagnoses:  1. Myalgia, other  2. Myofascial pain  3. Muscle spasm    Post-Procedure Diagnoses:  Same      ANESTHESIA: 50:50 solution of Bupivacaine  0.25% and lidocaine  2%    COMPLICATIONS: None.    Location(s) of pain: As noted above  Presence of point tenderness in a tight band of muscle in above area: Yes  Restricted range of motion: Yes  Conservative therapy attempted for at least 6 weeks:  activity modification, pharmacotherapy  Other components of comprehensive management: pharmacotherapy, activity modification    DESCRIPTION OF PROCEDURE: The procedure risks, hazards and alternatives were discussed with the patient and a proper consent was obtained. The area over each trigger point was prepped with alcohol utilizing sterile technique. After isolating it between two palpating fingertips a 27-gauge 1.5 needle was placed in the center of the myofascial spasms and a negative aspiration was performed. Then 0.5cc of the solution above was injected into each trigger point.     Trigger points in each of the muscle groups noted above were injected.     The patient tolerated the procedure well without any apparent difficulties or complications.     Pre Procedure Pain: 8        Estimated blood loss: none or minimal  Specimens: none  Patient tolerated the procedure well with no immediate complications. Pressure was applied, and hemostasis was accomplished.
